# Patient Record
Sex: Female | Born: 1995 | Race: Black or African American | Hispanic: No | Marital: Single | Smoking: Never smoker
Health system: Southern US, Community
[De-identification: ages and names within clinical notes are randomized; demographics above are authoritative.]

## PROBLEM LIST (undated history)

## (undated) ENCOUNTER — Inpatient Hospital Stay (HOSPITAL_COMMUNITY): Payer: Self-pay

## (undated) DIAGNOSIS — R569 Unspecified convulsions: Secondary | ICD-10-CM

## (undated) HISTORY — PX: TOOTH EXTRACTION: SUR596

---
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
## 2016-03-11 DIAGNOSIS — R918 Other nonspecific abnormal finding of lung field: Secondary | ICD-10-CM | POA: Diagnosis not present

## 2016-04-04 DIAGNOSIS — O99011 Anemia complicating pregnancy, first trimester: Secondary | ICD-10-CM | POA: Diagnosis not present

## 2016-04-04 DIAGNOSIS — G40309 Generalized idiopathic epilepsy and epileptic syndromes, not intractable, without status epilepticus: Secondary | ICD-10-CM | POA: Diagnosis not present

## 2016-04-04 DIAGNOSIS — Z283 Underimmunization status: Secondary | ICD-10-CM | POA: Diagnosis not present

## 2016-04-04 DIAGNOSIS — O09899 Supervision of other high risk pregnancies, unspecified trimester: Secondary | ICD-10-CM | POA: Insufficient documentation

## 2017-10-17 DIAGNOSIS — N3000 Acute cystitis without hematuria: Secondary | ICD-10-CM | POA: Diagnosis not present

## 2017-10-17 DIAGNOSIS — D649 Anemia, unspecified: Secondary | ICD-10-CM | POA: Diagnosis not present

## 2017-10-17 DIAGNOSIS — X58XXXA Exposure to other specified factors, initial encounter: Secondary | ICD-10-CM | POA: Diagnosis not present

## 2017-10-17 DIAGNOSIS — Z9114 Patient's other noncompliance with medication regimen: Secondary | ICD-10-CM | POA: Diagnosis not present

## 2017-10-17 DIAGNOSIS — R569 Unspecified convulsions: Secondary | ICD-10-CM | POA: Diagnosis not present

## 2017-10-17 DIAGNOSIS — S0990XA Unspecified injury of head, initial encounter: Secondary | ICD-10-CM | POA: Diagnosis not present

## 2017-10-17 DIAGNOSIS — E669 Obesity, unspecified: Secondary | ICD-10-CM | POA: Diagnosis not present

## 2017-10-17 DIAGNOSIS — M542 Cervicalgia: Secondary | ICD-10-CM | POA: Diagnosis not present

## 2017-10-17 DIAGNOSIS — N3001 Acute cystitis with hematuria: Secondary | ICD-10-CM | POA: Diagnosis not present

## 2017-10-17 DIAGNOSIS — G40909 Epilepsy, unspecified, not intractable, without status epilepticus: Secondary | ICD-10-CM | POA: Diagnosis not present

## 2017-10-17 DIAGNOSIS — Z6841 Body Mass Index (BMI) 40.0 and over, adult: Secondary | ICD-10-CM | POA: Diagnosis not present

## 2018-02-17 ENCOUNTER — Other Ambulatory Visit: Payer: Self-pay

## 2018-02-17 ENCOUNTER — Encounter (HOSPITAL_COMMUNITY): Payer: Self-pay | Admitting: Emergency Medicine

## 2018-02-17 ENCOUNTER — Emergency Department (HOSPITAL_COMMUNITY)
Admission: EM | Admit: 2018-02-17 | Discharge: 2018-02-17 | Disposition: A | Discharge status: Home / Self Care | Payer: Medicaid Other | Attending: Emergency Medicine | Admitting: Emergency Medicine

## 2018-02-17 ENCOUNTER — Emergency Department (HOSPITAL_COMMUNITY): Payer: Medicaid Other

## 2018-02-17 DIAGNOSIS — R059 Cough, unspecified: Secondary | ICD-10-CM

## 2018-02-17 DIAGNOSIS — G40909 Epilepsy, unspecified, not intractable, without status epilepticus: Secondary | ICD-10-CM | POA: Diagnosis not present

## 2018-02-17 DIAGNOSIS — R05 Cough: Secondary | ICD-10-CM | POA: Insufficient documentation

## 2018-02-17 DIAGNOSIS — R404 Transient alteration of awareness: Secondary | ICD-10-CM | POA: Diagnosis not present

## 2018-02-17 DIAGNOSIS — R569 Unspecified convulsions: Secondary | ICD-10-CM | POA: Diagnosis not present

## 2018-02-17 DIAGNOSIS — G40309 Generalized idiopathic epilepsy and epileptic syndromes, not intractable, without status epilepticus: Secondary | ICD-10-CM | POA: Diagnosis not present

## 2018-02-17 DIAGNOSIS — R531 Weakness: Secondary | ICD-10-CM | POA: Diagnosis not present

## 2018-02-17 LAB — CBC WITH DIFFERENTIAL/PLATELET
ABS IMMATURE GRANULOCYTES: 0.06 10*3/uL (ref 0.00–0.07)
Basophils Absolute: 0 10*3/uL (ref 0.0–0.1)
Basophils Relative: 0 %
Eosinophils Absolute: 0.1 10*3/uL (ref 0.0–0.5)
Eosinophils Relative: 2 %
HCT: 35.4 % — ABNORMAL LOW (ref 36.0–46.0)
Hemoglobin: 10.4 g/dL — ABNORMAL LOW (ref 12.0–15.0)
Immature Granulocytes: 1 %
Lymphocytes Relative: 27 %
Lymphs Abs: 1.9 10*3/uL (ref 0.7–4.0)
MCH: 23.9 pg — ABNORMAL LOW (ref 26.0–34.0)
MCHC: 29.4 g/dL — ABNORMAL LOW (ref 30.0–36.0)
MCV: 81.4 fL (ref 80.0–100.0)
MONO ABS: 0.6 10*3/uL (ref 0.1–1.0)
Monocytes Relative: 8 %
Neutro Abs: 4.3 10*3/uL (ref 1.7–7.7)
Neutrophils Relative %: 62 %
Platelets: 378 10*3/uL (ref 150–400)
RBC: 4.35 MIL/uL (ref 3.87–5.11)
RDW: 15.2 % (ref 11.5–15.5)
WBC: 7 10*3/uL (ref 4.0–10.5)
nRBC: 0 % (ref 0.0–0.2)

## 2018-02-17 LAB — BASIC METABOLIC PANEL
ANION GAP: 5 (ref 5–15)
BUN: 7 mg/dL (ref 6–20)
CO2: 21 mmol/L — ABNORMAL LOW (ref 22–32)
Calcium: 8.3 mg/dL — ABNORMAL LOW (ref 8.9–10.3)
Chloride: 109 mmol/L (ref 98–111)
Creatinine, Ser: 0.6 mg/dL (ref 0.44–1.00)
GFR calc non Af Amer: >60 mL/min (ref >60)
Glucose, Bld: 136 mg/dL — ABNORMAL HIGH (ref 70–99)
Potassium: 3.9 mmol/L (ref 3.5–5.1)
Sodium: 135 mmol/L (ref 135–145)

## 2018-02-17 LAB — I-STAT BETA HCG BLOOD, ED (MC, WL, AP ONLY): I-stat hCG, quantitative: <5 m[IU]/mL (ref <5)

## 2018-02-17 LAB — CBG MONITORING, ED: Glucose-Capillary: 133 mg/dL — ABNORMAL HIGH (ref 70–99)

## 2018-02-17 MED ORDER — TOPIRAMATE 25 MG PO TABS: 25.0000 mg | ORAL_TABLET | Freq: Two times a day (BID) | ORAL | 1 refills | Status: DC

## 2018-02-17 MED ORDER — LEVETIRACETAM IN NACL 1000 MG/100ML IV SOLN
1000.0000 mg | Freq: Once | INTRAVENOUS | Status: AC
  Administered 2018-02-17: 1000 mg via INTRAVENOUS
  Filled 2018-02-17: qty 100

## 2018-02-17 NOTE — ED Notes (Signed)
Bed: WA23 Expected date:  Expected time:  Means of arrival:  Comments: EMS 

## 2018-02-17 NOTE — Discharge Instructions (Addendum)
Call one of the neurology clinics listed to schedule a follow up appointment as soon as possible.   Take your medication as directed. Do not miss any doses.   If you have another seizure, please return to ER. You should also return to ER for new or worsening symptoms, any additional concerns.

## 2018-02-17 NOTE — ED Notes (Signed)
Ambulate pt attempted, pt did not make it out of room. After standing from bed, pt attempted to walk but began to slump over was caught NT. Pt stated wanting to try again, pt made it to door and began to stumble then once again begin knees began to give out.  Pt stated she was too weak to try anymore. Vitals at 1400 were taken immediately afterwards.

## 2018-02-17 NOTE — ED Notes (Signed)
Pt urine sample in room 

## 2018-02-17 NOTE — ED Notes (Signed)
Pt states that she still feels too nauseated to drink much of anything.
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
## 2018-02-17 NOTE — ED Provider Notes (Signed)
[facility]-EMERGENCY DEPT Provider Note   CSN: [id number] Arrival date & time: 02/17/18  1126     History   Chief Complaint Chief Complaint  Patient presents with  . Seizures    HPI Deborah Singleton is a 23 y.o. female.  The history is provided by the patient and medical records. No language interpreter was used.  Seizures   Deborah Singleton is a 23 y.o. female  with a PMH of seizures who presents to the Emergency Department for evaluation after likely seizure activity this morning.  Aunt at bedside witnessed seizure.  She states that she was sitting on the bed when she suddenly began clenching her arms and all 4 extremities were shaking.  She bit her tongue.  Her aunt and laid her on her side, then called EMS.  Seizure activity lasted maybe a minute, no more than 3 to 4 minutes.  Self resolved without intervention.  No further seizure activity.  Patient states that she should be on Keppra, but lost her Medicaid and has not gotten new prescriptions due to financial concerns.  She has had a cough for the last week or so, but no fever or shortness of breath.  No wheezing or chest pain.  No other recent illness.  She did stay up all night long last night celebrating her aunt's birthday.  Patient feels back to baseline and aunt also believes that she is back to her usual self as well.  Per chart review, it appears Topamax is the most recent medication that she has been on for her seizure disorder.  Per her neurology notes, this was tolerated much better by her.  Once I brought this up to her, patient stated that this was indeed correct and Topamax was indeed the most recent medication and what she should be on.   Past Medical History:  Diagnosis Date  . Seizures [facility](HCC)     Patient Active Problem List   Diagnosis Date Noted  . Supervision of normal first teen pregnancy in second trimester 01/20/2013  . Insufficient prenatal care in second trimester 01/20/2013    Past  Surgical History:  Procedure Laterality Date  . TOOTH EXTRACTION       OB History    Gravida  1   Para      Term      Preterm      AB      Living  0     SAB      TAB      Ectopic      Multiple      Live Births               Home Medications    Prior to Admission medications   Medication Sig Start Date End Date Taking? Authorizing Provider  ondansetron (ZOFRAN ODT) 4 MG disintegrating tablet 4mg  ODT q4 hours prn nausea/vomit Patient not taking: Reported on 02/17/2018 12/04/14   Pricilla LovelessGoldston, Scott, MD  topiramate (TOPAMAX) 25 MG tablet Take 1 tablet (25 mg total) by mouth 2 (two) times daily. Take 1 pill (25 mg) PO BID x 1 week, then 2 pill BID 02/17/18   Nalee Lightle, Chase PicketJaime Pilcher, PA-C    Family History Family History  Problem Relation Age of Onset  . Asthma Mother   . Depression Sister     Social History Social History   Tobacco Use  . Smoking status: Never Smoker  . Smokeless tobacco: Never Used  Substance Use Topics  . Alcohol use:  Yes    Comment: Occasionally   . Drug use: No     Allergies   Acetaminophen   Review of Systems Review of Systems  Skin: Positive for wound (Bit tongue.).  Neurological: Positive for seizures.  All other systems reviewed and are negative.    Physical Exam Updated Vital Signs BP 113/90   Pulse 88   Temp 98.6 F (37 C) (Oral)   Resp 17   Ht 5\' 1"  (1.549 m)   Wt 94.3 kg   LMP  (LMP Unknown)   SpO2 100%   BMI 39.30 kg/m   Physical Exam Vitals signs and nursing note reviewed.  Constitutional:      General: She is not in acute distress.    Appearance: She is well-developed.  HENT:     Head: Normocephalic and atraumatic.     Mouth/Throat:     Comments: Left side of the tongue with small laceration consistent with tongue biting. Neck:     Musculoskeletal: Neck supple.  Cardiovascular:     Rate and Rhythm: Normal rate and regular rhythm.     Heart sounds: Normal heart sounds. No murmur.  Pulmonary:      Effort: Pulmonary effort is normal. No respiratory distress.     Breath sounds: Normal breath sounds.  Abdominal:     General: There is no distension.     Palpations: Abdomen is soft.     Tenderness: There is no abdominal tenderness.  Skin:    General: Skin is warm and dry.  Neurological:     Mental Status: She is alert and oriented to person, place, and time.     Comments: Alert, oriented, thought content appropriate. Speech is clear and goal oriented, able to follow commands.  Cranial Nerves:  II:  Peripheral visual fields grossly normal, pupils equal, round, reactive to light III, IV, VI: EOM intact bilaterally, ptosis not present V,VII: smile symmetric, eyes kept closed tightly against resistance, facial light touch sensation equal VIII: hearing grossly normal IX, X: symmetric soft palate movement, uvula elevates symmetrically  XI: bilateral shoulder shrug symmetric and strong XII: midline tongue extension 5/5 muscle strength in upper and lower extremities bilaterally including strong and equal grip strength and dorsiflexion/plantar flexion Sensory to light touch normal in all four extremities.      ED Treatments / Results  Labs (all labs ordered are listed, but only abnormal results are displayed) Labs Reviewed  CBC WITH DIFFERENTIAL/PLATELET - Abnormal; Notable for the following components:      Result Value   Hemoglobin 10.4 (*)    HCT 35.4 (*)    MCH 23.9 (*)    MCHC 29.4 (*)    All other components within normal limits  BASIC METABOLIC PANEL - Abnormal; Notable for the following components:   CO2 21 (*)    Glucose, Bld 136 (*)    Calcium 8.3 (*)    All other components within normal limits  CBG MONITORING, ED - Abnormal; Notable for the following components:   Glucose-Capillary 133 (*)    All other components within normal limits  I-STAT BETA HCG BLOOD, ED ([facility], [facility], [facility] ONLY)    EKG None  Radiology Dg Chest 2 View  Result Date: 02/17/2018 CLINICAL DATA:   Grand mal seizure EXAM: CHEST - 2 VIEW COMPARISON:  None. FINDINGS: Lungs are clear. Heart size and pulmonary vascularity are normal. No adenopathy. No bone lesions. IMPRESSION: No edema or consolidation. Electronically Signed   By: Bretta Bang III M.D.  On: 02/17/2018 13:21    Procedures Procedures (including critical care time)  Medications Ordered in ED Medications  levETIRAcetam (KEPPRA) IVPB 1000 mg/100 mL premix (0 mg Intravenous Stopped 02/17/18 1249)     Initial Impression / Assessment and Plan / ED Course  I have reviewed the triage vital signs and the nursing notes.  Pertinent labs & imaging results that were available during my care of the patient were reviewed by me and considered in my medical decision making (see chart for details).    Pegeen Awwad is a 23 y.o. female who presents to ED for likely seizure activity earlier today.  Patient does have history of seizure disorder and has been noncompliant with her medication.  On exam, she is tired, but otherwise normal neurologic exam.  He did not hit her head or fall.  Labs and imaging reviewed and reassuring.  Loaded with Keppra upon arrival to the ER.  Observed for 3+ hours with no further seizure-like activity.  Upon discharge, patient was ambulatory, tolerating p.o. back to her baseline mental status.  Her last antiepileptic medication was Topamax and she tolerated this much better than Keppra.  We will restart her on Topamax.  She was recently moved from the [facility] area to [place], therefore does not have a neurologist in the area.  Referred to local neurology clinic.  Spoke with patient and family at bedside about reasons to return to the emergency department including another seizure.  All questions were answered.  Final Clinical Impressions(s) / ED Diagnoses   Final diagnoses:  Cough  Seizure [facility])    ED Discharge Orders         Ordered    topiramate (TOPAMAX) 25 MG tablet  2 times daily      02/17/18 1457           Jacai Kipp, Chase Picket, PA-C 02/17/18 1521    Mancel Bale, MD 02/19/18 1335

## 2018-02-17 NOTE — ED Notes (Signed)
Patient was given discharge teaching and verbalized understanding. Patient was taken out of ED with a wheelchair.

## 2018-02-17 NOTE — ED Notes (Signed)
ED EKG already taken but not yet printed

## 2018-02-17 NOTE — ED Notes (Signed)
Patient ambulated to the restroom and back to her room. Patient stated she felt weak but maintained appropriate balance while walking. Patient was given [place] sandwich upon request. Patient is eating and drinking with no difficulty.

## 2018-02-18 ENCOUNTER — Encounter: Payer: Self-pay | Admitting: Neurology

## 2018-04-23 ENCOUNTER — Telehealth: Payer: Self-pay

## 2018-04-23 NOTE — Telephone Encounter (Signed)
Unfortunately I cannot call in Rx without a visit first. Can she be put in for 9:30am on 3/25 Wed (she is currently scheduled for 3/30). Thanks

## 2018-04-23 NOTE — Telephone Encounter (Signed)
Called pt to advise that we are not seeing pt's in the office during this time due to COVID19 pandemic.  Gathered information to set up Virtual Visit.   Pt states that she is out of Topiramate and would like to know if Dr. Karel Jarvis OK with prescribing.  Pt states she now uses [facility] on [address]. Advised that I would send message and return call with Dr. Rosalyn Gess response.

## 2018-04-24 ENCOUNTER — Telehealth (INDEPENDENT_AMBULATORY_CARE_PROVIDER_SITE_OTHER): Payer: Medicaid Other | Admitting: Neurology

## 2018-04-24 ENCOUNTER — Other Ambulatory Visit: Payer: Self-pay

## 2018-04-24 DIAGNOSIS — G40309 Generalized idiopathic epilepsy and epileptic syndromes, not intractable, without status epilepticus: Secondary | ICD-10-CM

## 2018-04-24 MED ORDER — TOPIRAMATE 50 MG PO TABS: ORAL_TABLET | ORAL | 11 refills | Status: DC

## 2018-04-24 MED ORDER — FOLIC ACID 5 MG PO CAPS: ORAL_CAPSULE | ORAL | 11 refills | Status: DC

## 2018-04-24 NOTE — Progress Notes (Signed)
Virtual Visit via Video Note The purpose of this virtual visit is to provide medical care while limiting exposure to the novel coronavirus.    Consent was obtained for video visit:  Yes.   Answered questions that patient had about telehealth interaction:  Yes.   I discussed the limitations, risks, security and privacy concerns of performing an evaluation and management service by telemedicine. I also discussed with the patient that there may be a patient responsible charge related to this service. The patient expressed understanding and agreed to proceed.  Pt location: Home Physician Location: office Name of referring provider:  Mancel Bale, MD I connected with Deborah Singleton at patients initiation/request on 04/24/2018 at  9:30 AM EDT by video enabled telemedicine application and verified that I am speaking with the correct person using two identifiers. Pt MRN:  [id number] Pt DOB:  [birth year]  History of Present Illness:  This is a 23 year old right-handed woman with a history of seizures since age 23 presenting to establish care. She was previously seeing epileptologist Dr. Sherlean Foot at [facility], records were reviewed and will be summarized below. Seizures started at age 23 soon after her son was born. She recalls feeling lightheaded and "going in and out" then had a seizure in the car. She reports body jerks where she would drop things. She had an EEG at [facility] in 08/2014 with interictal epileptiform discharges consisting of polyspikes which were not clearly lateralizing, they are frequently seen on the left side but also occasionally on the right hemisphere. There were also polyspikes that were generalized. MRI brain with and without contrast in 01/2014 reported a single T2 hyperintensity in the left frontal region otherwise normal, MRA normal. She was previously on Levetiracetam and Lamotrigine, currently on Topiramate 50mg  BID. Her last visit at [facility] was in 2018, at that time she was started  on Levetiracetam. She was in the ER in 09/2017 where she was prescribed the Topiramate, then lost her insurance and was back in the ER for a seizure on 02/17/2018 and Topiramate was restarted. She reports seizures every 1-2 months. She has bitten her tongue a few times, no post-ictal focal weakness. She would lose her memory after seizures. She lives with her children's father who has mentioned staring spells, but not that often. Seizure triggers are alcohol and "being around people who smoke and drink." She has headaches if she does not get a lot of sleep or moves around a lot. No olfactory/gustatory hallucinations, dizziness, diplopia, dysarthria/dysphagia, neck/back pain, focal numbness/tingling/weakness, bowel/bladder dysfunction. Sleep is good, sometimes she tosses and turns. She is not driving. She is currently unemployed. No pregnancy plans, she is sexually active and currently not on contraception. She felt the implant and Depo shots were causing weight gain and more seizures.   Observations/Objective:  Patient is awake, alert, oriented x 3. No aphasia or dysarthria. Able to follow commands. Remote and recent memory intact. Able to name and repeat. Cranial nerves: Pupils equal, round. Extraocular movements intact with no nystagmus. Visual fields full to gross confrontation. No facial asymmetry. Motor: moves all extremities symmetrically, no pronator drift. No incoordination on finger to screen testing. Gait narrow-based and steady, no ataxia, able to tandem walk adequately. Romberg negative.  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 She is on a low dose of Topiramate 50mg  BID with continued seizures, last seizure was in  January 2020, increase Topiramate to 75mg  BID. We also discussed issues in women with epilepsy, she has no pregnancy plans but not on contraception and sexually active, discussed talking to her gynecologist about the Mirena IUD. We discussed effects of Topiramate on fetus in the event she becomes pregnant, start daily folic acid. She does not drive. Follow-up in 3-4 months, call for any changes.   Follow Up Instructions:   -I discussed the assessment and treatment plan with the patient. The patient was provided an opportunity to ask questions and all were answered. The patient agreed with the plan and demonstrated an understanding of the instructions.   The patient was advised to call back or seek an in-person evaluation if the symptoms worsen or if the condition fails to improve as anticipated.   Total Time spent in visit with the patient was 45 minutes, of which more than 50% of the time was spent in counseling and/or coordinating care on the above.   Pt understands and agrees with the plan of care outlined.     Van Clines, MD

## 2018-04-29 ENCOUNTER — Ambulatory Visit: Payer: Medicaid Other | Admitting: Neurology

## 2018-06-26 DIAGNOSIS — L0231 Cutaneous abscess of buttock: Secondary | ICD-10-CM | POA: Diagnosis not present

## 2018-10-01 DIAGNOSIS — Z862 Personal history of diseases of the blood and blood-forming organs and certain disorders involving the immune mechanism: Secondary | ICD-10-CM | POA: Diagnosis not present

## 2018-10-01 DIAGNOSIS — Z658 Other specified problems related to psychosocial circumstances: Secondary | ICD-10-CM | POA: Diagnosis not present

## 2018-10-01 DIAGNOSIS — G40309 Generalized idiopathic epilepsy and epileptic syndromes, not intractable, without status epilepticus: Secondary | ICD-10-CM | POA: Diagnosis not present

## 2018-10-01 DIAGNOSIS — Z23 Encounter for immunization: Secondary | ICD-10-CM | POA: Diagnosis not present

## 2018-10-09 ENCOUNTER — Other Ambulatory Visit: Payer: Self-pay

## 2018-10-09 ENCOUNTER — Encounter (HOSPITAL_COMMUNITY): Payer: Self-pay | Admitting: Emergency Medicine

## 2018-10-09 ENCOUNTER — Emergency Department (HOSPITAL_COMMUNITY)
Admission: EM | Admit: 2018-10-09 | Discharge: 2018-10-10 | Discharge status: Against Medical Advice | Payer: Medicaid Other | Attending: Emergency Medicine | Admitting: Emergency Medicine

## 2018-10-09 DIAGNOSIS — Z5321 Procedure and treatment not carried out due to patient leaving prior to being seen by health care provider: Secondary | ICD-10-CM | POA: Insufficient documentation

## 2018-10-09 DIAGNOSIS — L02413 Cutaneous abscess of right upper limb: Secondary | ICD-10-CM | POA: Diagnosis present

## 2018-10-09 NOTE — ED Triage Notes (Signed)
C/o abscess to R forearm and R buttocks since Sunday.

## 2018-10-09 NOTE — ED Notes (Signed)
Called for pt x3. No answer.  

## 2018-10-15 DIAGNOSIS — Z3042 Encounter for surveillance of injectable contraceptive: Secondary | ICD-10-CM | POA: Diagnosis not present

## 2018-10-15 DIAGNOSIS — Z113 Encounter for screening for infections with a predominantly sexual mode of transmission: Secondary | ICD-10-CM | POA: Diagnosis not present

## 2018-10-15 DIAGNOSIS — L02413 Cutaneous abscess of right upper limb: Secondary | ICD-10-CM | POA: Diagnosis not present

## 2018-10-15 DIAGNOSIS — R4589 Other symptoms and signs involving emotional state: Secondary | ICD-10-CM | POA: Diagnosis not present

## 2018-10-15 DIAGNOSIS — Z3009 Encounter for other general counseling and advice on contraception: Secondary | ICD-10-CM | POA: Diagnosis not present

## 2018-10-15 DIAGNOSIS — Z124 Encounter for screening for malignant neoplasm of cervix: Secondary | ICD-10-CM | POA: Diagnosis not present

## 2018-12-23 ENCOUNTER — Other Ambulatory Visit: Payer: Self-pay

## 2018-12-23 DIAGNOSIS — Z20822 Contact with and (suspected) exposure to covid-19: Secondary | ICD-10-CM

## 2018-12-23 DIAGNOSIS — Z20828 Contact with and (suspected) exposure to other viral communicable diseases: Secondary | ICD-10-CM | POA: Diagnosis not present

## 2018-12-24 ENCOUNTER — Encounter: Payer: Self-pay | Admitting: Neurology

## 2018-12-25 LAB — NOVEL CORONAVIRUS, NAA: SARS-CoV-2, NAA: NOT DETECTED

## 2018-12-26 ENCOUNTER — Encounter (HOSPITAL_COMMUNITY): Payer: Self-pay | Admitting: Obstetrics and Gynecology

## 2018-12-26 ENCOUNTER — Other Ambulatory Visit: Payer: Self-pay

## 2018-12-26 ENCOUNTER — Emergency Department (HOSPITAL_COMMUNITY): Payer: Medicaid Other

## 2018-12-26 ENCOUNTER — Emergency Department (HOSPITAL_COMMUNITY)
Admission: EM | Admit: 2018-12-26 | Discharge: 2018-12-26 | Disposition: A | Discharge status: Home / Self Care | Payer: Medicaid Other | Attending: Emergency Medicine | Admitting: Emergency Medicine

## 2018-12-26 DIAGNOSIS — R059 Cough, unspecified: Secondary | ICD-10-CM

## 2018-12-26 DIAGNOSIS — R05 Cough: Secondary | ICD-10-CM | POA: Insufficient documentation

## 2018-12-26 DIAGNOSIS — Z79899 Other long term (current) drug therapy: Secondary | ICD-10-CM | POA: Diagnosis not present

## 2018-12-26 DIAGNOSIS — R0602 Shortness of breath: Secondary | ICD-10-CM | POA: Insufficient documentation

## 2018-12-26 DIAGNOSIS — R000 Tachycardia, unspecified: Secondary | ICD-10-CM | POA: Diagnosis not present

## 2018-12-26 DIAGNOSIS — Z20828 Contact with and (suspected) exposure to other viral communicable diseases: Secondary | ICD-10-CM | POA: Insufficient documentation

## 2018-12-26 DIAGNOSIS — Z20822 Contact with and (suspected) exposure to covid-19: Secondary | ICD-10-CM

## 2018-12-26 LAB — CBC WITH DIFFERENTIAL/PLATELET
Abs Immature Granulocytes: 0 10*3/uL (ref 0.00–0.07)
Basophils Absolute: 0 10*3/uL (ref 0.0–0.1)
Basophils Relative: 1 %
Eosinophils Absolute: 0 10*3/uL (ref 0.0–0.5)
Eosinophils Relative: 1 %
HCT: 41.5 % (ref 36.0–46.0)
Hemoglobin: 12.8 g/dL (ref 12.0–15.0)
Immature Granulocytes: 0 %
Lymphocytes Relative: 65 %
Lymphs Abs: 1.9 10*3/uL (ref 0.7–4.0)
MCH: 26.4 pg (ref 26.0–34.0)
MCHC: 30.8 g/dL (ref 30.0–36.0)
MCV: 85.6 fL (ref 80.0–100.0)
Monocytes Absolute: 0.4 10*3/uL (ref 0.1–1.0)
Monocytes Relative: 13 %
Neutro Abs: 0.6 10*3/uL — ABNORMAL LOW (ref 1.7–7.7)
Neutrophils Relative %: 20 %
Platelets: 217 10*3/uL (ref 150–400)
RBC: 4.85 MIL/uL (ref 3.87–5.11)
RDW: 14.5 % (ref 11.5–15.5)
WBC: 2.9 10*3/uL — ABNORMAL LOW (ref 4.0–10.5)
nRBC: 0 % (ref 0.0–0.2)

## 2018-12-26 LAB — COMPREHENSIVE METABOLIC PANEL
ALT: 42 U/L (ref 0–44)
AST: 57 U/L — ABNORMAL HIGH (ref 15–41)
Albumin: 4 g/dL (ref 3.5–5.0)
Alkaline Phosphatase: 77 U/L (ref 38–126)
Anion gap: 7 (ref 5–15)
BUN: 6 mg/dL (ref 6–20)
CO2: 20 mmol/L — ABNORMAL LOW (ref 22–32)
Calcium: 8.7 mg/dL — ABNORMAL LOW (ref 8.9–10.3)
Chloride: 113 mmol/L — ABNORMAL HIGH (ref 98–111)
Creatinine, Ser: 1.01 mg/dL — ABNORMAL HIGH (ref 0.44–1.00)
GFR calc Af Amer: >60 mL/min (ref >60)
GFR calc non Af Amer: >60 mL/min (ref >60)
Glucose, Bld: 127 mg/dL — ABNORMAL HIGH (ref 70–99)
Potassium: 3.1 mmol/L — ABNORMAL LOW (ref 3.5–5.1)
Sodium: 140 mmol/L (ref 135–145)
Total Bilirubin: 0.4 mg/dL (ref 0.3–1.2)
Total Protein: 7.3 g/dL (ref 6.5–8.1)

## 2018-12-26 LAB — D-DIMER, QUANTITATIVE: D-Dimer, Quant: 1.01 ug/mL-FEU — ABNORMAL HIGH (ref 0.00–0.50)

## 2018-12-26 LAB — TROPONIN I (HIGH SENSITIVITY)
Troponin I (High Sensitivity): <2 ng/L (ref <18)
Troponin I (High Sensitivity): <2 ng/L (ref <18)

## 2018-12-26 LAB — I-STAT BETA HCG BLOOD, ED (MC, WL, AP ONLY): I-stat hCG, quantitative: <5 m[IU]/mL (ref <5)

## 2018-12-26 MED ORDER — ALBUTEROL SULFATE HFA 108 (90 BASE) MCG/ACT IN AERS
6.0000 | INHALATION_SPRAY | Freq: Once | RESPIRATORY_TRACT | Status: AC
  Administered 2018-12-26: 17:00:00 6 via RESPIRATORY_TRACT
  Filled 2018-12-26: qty 6.7

## 2018-12-26 MED ORDER — ALBUTEROL SULFATE HFA 108 (90 BASE) MCG/ACT IN AERS
2.0000 | INHALATION_SPRAY | Freq: Once | RESPIRATORY_TRACT | Status: AC
  Administered 2018-12-26: 2 via RESPIRATORY_TRACT

## 2018-12-26 MED ORDER — SODIUM CHLORIDE (PF) 0.9 % IJ SOLN
INTRAMUSCULAR | Status: AC
  Filled 2018-12-26: qty 50

## 2018-12-26 MED ORDER — HYDROXYZINE HCL 25 MG PO TABS
25.0000 mg | ORAL_TABLET | Freq: Once | ORAL | Status: AC
  Administered 2018-12-26: 25 mg via ORAL
  Filled 2018-12-26: qty 1

## 2018-12-26 MED ORDER — BENZONATATE 100 MG PO CAPS: 100.0000 mg | ORAL_CAPSULE | Freq: Three times a day (TID) | ORAL | 0 refills | Status: DC

## 2018-12-26 MED ORDER — IOHEXOL 300 MG/ML  SOLN
100.0000 mL | Freq: Once | INTRAMUSCULAR | Status: AC | PRN
  Administered 2018-12-26: 100 mL via INTRAVENOUS

## 2018-12-26 NOTE — ED Notes (Signed)
Pt said she feels scared bc she hasn't had covid before now. I tried to ease her concerns and answer her questions.

## 2018-12-26 NOTE — Discharge Instructions (Signed)
Albuterol inhaler as needed.  Take Tessalon Perles as needed for cough.  You need to stay until your Covid test has resulted.

## 2018-12-26 NOTE — ED Provider Notes (Signed)
[facility]-EMERGENCY DEPT Provider Note   CSN: [id number] Arrival date & time: 12/26/18  1451    History   Chief Complaint Chief Complaint  Patient presents with   Shortness of Breath   HPI Deborah Singleton is a 23 y.o. female with past medical history significant for seizures who presents for evaluation of SOB. Patient states she has had shortness of breath and chest pain over the last 3 days.  Shortness of breath with exertion.  Chest pain is pleuritic in nature.  No exertional chest pain.  She denies prior history of PE or DVT.  She did have a Covid test on Sunday which was negative per patient.  She denies any fever, chills, nausea, vomiting, abdominal pain, diarrhea, dysuria, lateral redness, swelling, warmth to extremities.  She denies associated diaphoresis, lightheadedness, dizziness.  No prior heart or lung issues.  She did try her mother's albuterol inhaler which did not help her symptoms.  Denies additional aggravating or alleviating factors.  No recent surgery, immobilization, OCP, Heavy vaginal bleeding. No hx HTN, Hyperlipidemia, hx of MI. Possible family hx of MI at early age.  History obtained from patient and past medical records.  No interpreter is used.     HPI  Past Medical History:  Diagnosis Date   Seizures [facility](HCC)     Patient Active Problem List   Diagnosis Date Noted   Generalized idiopathic epilepsy and epileptic syndromes, not intractable, without status epilepticus (HCC) 04/24/2018   Supervision of normal first teen pregnancy in second trimester 01/20/2013   Insufficient prenatal care in second trimester 01/20/2013    Past Surgical History:  Procedure Laterality Date   TOOTH EXTRACTION       OB History    Gravida  1   Para      Term      Preterm      AB      Living  0     SAB      TAB      Ectopic      Multiple      Live Births               Home Medications    Prior to Admission medications     Medication Sig Start Date End Date Taking? Authorizing Provider  albuterol (ACCUNEB) 1.25 MG/3ML nebulizer solution Take 1 ampule by nebulization every 6 (six) hours as needed for wheezing or shortness of breath.   Yes [provider]  topiramate (TOPAMAX) 50 MG tablet Take 1.5 tablets twice a day Patient taking differently: Take 75 mg by mouth 2 (two) times daily.  04/24/18  Yes Van ClinesAquino, Karen M, MD  benzonatate (TESSALON) 100 MG capsule Take 1 capsule (100 mg total) by mouth every 8 (eight) hours. 12/26/18   Cherylanne Ardelean A, PA-C    Family History Family History  Problem Relation Age of Onset   Asthma Mother    Depression Sister     Social History Social History   Tobacco Use   Smoking status: Never Smoker   Smokeless tobacco: Never Used  Substance Use Topics   Alcohol use: Yes    Comment: Occasionally    Drug use: No     Allergies   Acetaminophen   Review of Systems Review of Systems  Constitutional: Negative.   HENT: Negative.   Respiratory: Positive for shortness of breath. Negative for apnea, choking, chest tightness, wheezing and stridor. Cough: "Mild cough."   Cardiovascular: Positive for chest pain. Negative  for palpitations and leg swelling.  Gastrointestinal: Negative.   Genitourinary: Negative.   Musculoskeletal: Negative.   Skin: Negative.   Neurological: Negative.   All other systems reviewed and are negative.    Physical Exam Updated Vital Signs BP 120/71    Pulse 95    Temp 98.7 F (37.1 C) (Oral)    Resp (!) 28    SpO2 98%   Physical Exam Vitals signs and nursing note reviewed.  Constitutional:      General: She is not in acute distress.    Appearance: She is well-developed. She is not ill-appearing, toxic-appearing or diaphoretic.  HENT:     Head: Normocephalic and atraumatic.     Jaw: There is normal jaw occlusion.     Nose:     Comments:  No sinus tenderness.    Mouth/Throat:     Mouth: Mucous membranes are moist.      Pharynx: Oropharynx is clear.     Comments: Posterior oropharynx clear.  Mucous membranes moist.  Tonsils without erythema or exudate.  Uvula midline without deviation.  No evidence of PTA or RPA.  No drooling, dysphasia or trismus.  Phonation normal. Eyes:     Pupils: Pupils are equal, round, and reactive to light.  Neck:     Musculoskeletal: Normal range of motion.     Trachea: Trachea and phonation normal.     Meningeal: Brudzinski's sign and Kernig's sign absent.     Comments: No Neck stiffness or neck rigidity.  No meningismus.  No cervical lymphadenopathy. Cardiovascular:     Rate and Rhythm: Normal rate.     Comments: No murmurs rubs or gallops. Pulmonary:     Effort: Pulmonary effort is normal. No respiratory distress.     Breath sounds: Normal breath sounds.     Comments: Clear to auscultation bilaterally without wheeze, rhonchi or rales.  No accessory muscle usage.  Able speak in full sentences. Abdominal:     General: There is no distension.     Comments: Soft, nontender without rebound or guarding.  No CVA tenderness.  Musculoskeletal: Normal range of motion.     Comments: Moves all 4 extremities without difficulty.  Lower extremities without edema, erythema or warmth.  Skin:    General: Skin is warm and dry.     Comments: Brisk capillary refill.  No rashes or lesions.  Neurological:     Mental Status: She is alert.     Comments: Ambulatory in department without difficulty.  Cranial nerves II through XII grossly intact.  No facial droop.  No aphasia.    ED Treatments / Results  Labs (all labs ordered are listed, but only abnormal results are displayed) Labs Reviewed  CBC WITH DIFFERENTIAL/PLATELET - Abnormal; Notable for the following components:      Result Value   WBC 2.9 (*)    Neutro Abs 0.6 (*)    All other components within normal limits  COMPREHENSIVE METABOLIC PANEL - Abnormal; Notable for the following components:   Potassium 3.1 (*)    Chloride 113 (*)     CO2 20 (*)    Glucose, Bld 127 (*)    Creatinine, Ser 1.01 (*)    Calcium 8.7 (*)    AST 57 (*)    All other components within normal limits  D-DIMER, QUANTITATIVE (NOT AT [facility]) - Abnormal; Notable for the following components:   D-Dimer, Quant 1.01 (*)    All other components within normal limits  SARS CORONAVIRUS 2 (TAT 6-24  HRS)  PATHOLOGIST SMEAR REVIEW  I-STAT BETA HCG BLOOD, ED ([facility], [facility], [facility] ONLY)  TROPONIN I (HIGH SENSITIVITY)  TROPONIN I (HIGH SENSITIVITY)    EKG EKG Interpretation  Date/Time:  Thursday December 26 2018 15:05:17 EST Ventricular Rate:  101 PR Interval:    QRS Duration: 76 QT Interval:  333 QTC Calculation: 432 R Axis:   31 Text Interpretation: Sinus tachycardia Borderline T wave abnormalities No previous tracing Confirmed by Gwyneth Sprout ([id number]) on 12/26/2018 3:14:02 PM  Radiology Ct Angio Chest Pe W And/or Wo Contrast  Result Date: 12/26/2018 CLINICAL DATA:  Shortness of breath, concern for PE EXAM: CT ANGIOGRAPHY CHEST WITH CONTRAST TECHNIQUE: Multidetector CT imaging of the chest was performed using the standard protocol during bolus administration of intravenous contrast. Multiplanar CT image reconstructions and MIPs were obtained to evaluate the vascular anatomy. CONTRAST:  OMNIPAQUE IOHEXOL 300 MG/ML  SOLN COMPARISON:  Chest radiograph from the same day. FINDINGS: Cardiovascular: Satisfactory opacification of the pulmonary arteries to the segmental level. No evidence of pulmonary embolism. Normal heart size. No pericardial effusion. Mediastinum/Nodes: No enlarged mediastinal, hilar, or axillary lymph nodes. Thyroid gland, trachea, and esophagus demonstrate no significant findings. Lungs/Pleura: Bilateral upper lobe predominant ground-glass opacities with areas of consolidation are seen. There is no pleural effusion or pneumothorax. Upper Abdomen: No acute abnormality. Musculoskeletal: No chest wall abnormality. No acute or significant osseous  findings. Review of the MIP images confirms the above findings. IMPRESSION: 1. No evidence for pulmonary embolus. 2. Bilateral upper lobe predominant ground-glass opacities with areas of consolidation, likely represents pneumonia. COVID-19 pneumonia can have a similar appearance. Electronically Signed   By: Romona Curls M.D.   On: 12/26/2018 18:48   Dg Chest Portable 1 View  Result Date: 12/26/2018 CLINICAL DATA:  Shortness of breath. Asthma. EXAM: PORTABLE CHEST 1 VIEW COMPARISON:  02/17/2018 FINDINGS: Heart size and pulmonary vascularity are normal. Lungs are clear. Extremely shallow inspiration. No bone abnormality. IMPRESSION: No acute abnormalities. Extremely shallow inspiration. Electronically Signed   By: Francene Boyers M.D.   On: 12/26/2018 15:28    Procedures Procedures (including critical care time)  Medications Ordered in ED Medications  sodium chloride (PF) 0.9 % injection (has no administration in time range)  hydrOXYzine (ATARAX/VISTARIL) tablet 25 mg (25 mg Oral Given 12/26/18 1721)  albuterol (VENTOLIN HFA) 108 (90 Base) MCG/ACT inhaler 6 puff (6 puffs Inhalation Given 12/26/18 1721)  iohexol (OMNIPAQUE) 300 MG/ML solution 100 mL (100 mLs Intravenous Contrast Given 12/26/18 1816)    Initial Impression / Assessment and Plan / ED Course  I have reviewed the triage vital signs and the nursing notes.  Pertinent labs & imaging results that were available during my care of the patient were reviewed by me and considered in my medical decision making (see chart for details).  23 year old presents for evaluation of CP and SOB x 3 days. Afebrile, non septic appears. Negative COVID on Sunday however not not having symptoms then. CP pleuritic in nature. No exertional or radiation of pain. Possible MI at early age in family? Patient unsure. Heart and lungs clear on exam. No evidence of DVT on exam however she is tachycardic and cannot PERC. Possible tachycardia due to albuterol at home  form her Mother.  Labs and imaging personally reviewed: CBC with leukopenia at 2.9, Hgb 12.8 Troponin negative D-dimer elevated at 1.01, Will obtain CTA chest to r/o PE CMP with hypokalemia at 3.1, mildly elevated glucose at 127, creatinine at 1.01 EKG with sinus  tachycardia DG chest without acute infiltrates, pulmonary edema, pneumothorax, cardiomegaly CTA chest with bilateral pneumonia possibly COVID pneumonia  Patient reevaluated Sx improved. Patients VS without tachypnea or hypoxia. Ambulatory in ED without hypoxia. Question COVID as cause of SOB and mild cough give CT scan. Will retest outpatient COVID test and patient was likely too early in her disease process to test positive flexor test from 4 days ago.  Low suspicion for ACS, PE, dissection, myocarditis, pericarditis.  Discussed symptomatic management.  Patient to return for any worsening symptoms.  She has no evidence of sepsis or sirs.  Do not feel that patient needs antibiotics for bacterial pneumonia at this time given likely Covid source.  The patient has been appropriately medically screened and/or stabilized in the ED. I have low suspicion for any other emergent medical condition which would require further screening, evaluation or treatment in the ED or require inpatient management.  Patient is hemodynamically stable and in no acute distress.  Patient able to ambulate in department prior to ED.  Evaluation does not show acute pathology that would require ongoing or additional emergent interventions while in the emergency department or further inpatient treatment.  I have discussed the diagnosis with the patient and answered all questions.  Pain is been managed while in the emergency department and patient has no further complaints prior to discharge.  Patient is comfortable with plan discussed in room and is stable for discharge at this time.  I have discussed strict return precautions for returning to the emergency department.   Patient was encouraged to follow-up with PCP/specialist refer to at discharge.        Deborah Singleton was evaluated in Emergency Department on 12/26/2018 for the symptoms described in the history of present illness. She was evaluated in the context of the global COVID-19 pandemic, which necessitated consideration that the patient might be at risk for infection with the SARS-CoV-2 virus that causes COVID-19. Institutional protocols and algorithms that pertain to the evaluation of patients at risk for COVID-19 are in a state of rapid change based on information released by regulatory bodies including the CDC and federal and state organizations. These policies and algorithms were followed during the patient's care in the ED. Final Clinical Impressions(s) / ED Diagnoses   Final diagnoses:  Shortness of breath  COVID-19 virus test result unknown  Cough    ED Discharge Orders         Ordered    benzonatate (TESSALON) 100 MG capsule  Every 8 hours     12/26/18 1933           Alivia Cimino A, PA-C 12/26/18 1936    Gwyneth Sprout, MD 12/26/18 2146

## 2018-12-26 NOTE — ED Triage Notes (Signed)
Patient reports she Is having SOB and dyspnea with exertion. Patient reports her mom tried to give her a breathing treatment and it did not help.  Patient denies N/V/D.  Patient denies cough or fever.  Patient denies chance of pregnancy.

## 2018-12-26 NOTE — ED Notes (Addendum)
Trying to explain to pt that her CT scan looks as if she has COVID. Pt said she didn't hear anything the provider said because she was asleep. Pt said she is frustrated and she can't breathe. I explained that her O2 sat has been 99-100% since I have been in the room. And, I recommended she use the albuterol inhaler and fill the tessalon pearl prescription. These will help with her breathing. Explained the tessalon pearls are for cough and inhaler for difficulty breathing. I told her that she needs to quarantine for 10 days. Pt shook her head and didn't respond. I encouraged her to express her concerns, but she did not elaborate.

## 2018-12-26 NOTE — ED Notes (Signed)
Patient returned from radiology

## 2018-12-27 LAB — SARS CORONAVIRUS 2 (TAT 6-24 HRS): SARS Coronavirus 2: NEGATIVE

## 2018-12-27 LAB — PATHOLOGIST SMEAR REVIEW

## 2018-12-30 ENCOUNTER — Ambulatory Visit: Payer: Medicaid Other | Admitting: Neurology

## 2019-03-04 ENCOUNTER — Emergency Department (HOSPITAL_COMMUNITY)
Admission: EM | Admit: 2019-03-04 | Discharge: 2019-03-04 | Disposition: A | Discharge status: Home / Self Care | Payer: Medicaid Other | Attending: Emergency Medicine | Admitting: Emergency Medicine

## 2019-03-04 ENCOUNTER — Other Ambulatory Visit: Payer: Self-pay

## 2019-03-04 ENCOUNTER — Encounter (HOSPITAL_COMMUNITY): Payer: Self-pay | Admitting: Emergency Medicine

## 2019-03-04 DIAGNOSIS — B3731 Acute candidiasis of vulva and vagina: Secondary | ICD-10-CM

## 2019-03-04 DIAGNOSIS — B373 Candidiasis of vulva and vagina: Secondary | ICD-10-CM | POA: Insufficient documentation

## 2019-03-04 DIAGNOSIS — A5901 Trichomonal vulvovaginitis: Secondary | ICD-10-CM | POA: Diagnosis not present

## 2019-03-04 DIAGNOSIS — N899 Noninflammatory disorder of vagina, unspecified: Secondary | ICD-10-CM | POA: Diagnosis present

## 2019-03-04 DIAGNOSIS — N898 Other specified noninflammatory disorders of vagina: Secondary | ICD-10-CM | POA: Diagnosis not present

## 2019-03-04 LAB — URINALYSIS, ROUTINE W REFLEX MICROSCOPIC
Bilirubin Urine: NEGATIVE
Glucose, UA: NEGATIVE mg/dL
Hgb urine dipstick: NEGATIVE
Ketones, ur: NEGATIVE mg/dL
Nitrite: NEGATIVE
Protein, ur: 30 mg/dL — AB
Specific Gravity, Urine: 1.024 (ref 1.005–1.030)
pH: 7 (ref 5.0–8.0)

## 2019-03-04 LAB — WET PREP, GENITAL: Sperm: NONE SEEN

## 2019-03-04 LAB — PREGNANCY, URINE: Preg Test, Ur: NEGATIVE

## 2019-03-04 MED ORDER — AZITHROMYCIN 1 G PO PACK
1.0000 g | PACK | Freq: Once | ORAL | Status: AC
  Administered 2019-03-04: 13:00:00 1 g via ORAL
  Filled 2019-03-04: qty 1

## 2019-03-04 MED ORDER — METRONIDAZOLE 500 MG PO TABS
2000.0000 mg | ORAL_TABLET | Freq: Once | ORAL | Status: AC
  Administered 2019-03-04: 2000 mg via ORAL
  Filled 2019-03-04: qty 4

## 2019-03-04 MED ORDER — CEFTRIAXONE SODIUM 500 MG IJ SOLR
500.0000 mg | Freq: Once | INTRAMUSCULAR | Status: AC
  Administered 2019-03-04: 500 mg via INTRAMUSCULAR
  Filled 2019-03-04: qty 500

## 2019-03-04 MED ORDER — FLUCONAZOLE 150 MG PO TABS
150.0000 mg | ORAL_TABLET | Freq: Once | ORAL | Status: AC
  Administered 2019-03-04: 150 mg via ORAL
  Filled 2019-03-04: qty 1

## 2019-03-04 NOTE — ED Provider Notes (Signed)
[facility] EMERGENCY DEPARTMENT Provider Note   CSN: [id number] Arrival date & time: 03/04/19  1005     History Chief Complaint  Patient presents with  . Vaginal Discharge    Deborah Singleton is a 24 y.o. female.  Patient is a 24 year old female with past medical history of seizure disorder presenting to the emergency department for vaginal itching and irritation.  Patient reports that since yesterday she has had some itching and feels like the inside of her vagina is swelling.  Reports some white drainage discharge.  She is sexually active.  Denies any abdominal pain, dysuria, flank pain, fever, nausea, vomiting, problems with her menstrual cycle.        Past Medical History:  Diagnosis Date  . Seizures [facility])     Patient Active Problem List   Diagnosis Date Noted  . Generalized idiopathic epilepsy and epileptic syndromes, not intractable, without status epilepticus ([place]) 04/24/2018  . Supervision of normal first teen pregnancy in second trimester 01/20/2013  . Insufficient prenatal care in second trimester 01/20/2013    Past Surgical History:  Procedure Laterality Date  . TOOTH EXTRACTION       OB History    Gravida  1   Para      Term      Preterm      AB      Living  0     SAB      TAB      Ectopic      Multiple      Live Births              Family History  Problem Relation Age of Onset  . Asthma Mother   . Depression Sister     Social History   Tobacco Use  . Smoking status: Never Smoker  . Smokeless tobacco: Never Used  Substance Use Topics  . Alcohol use: Yes    Comment: Occasionally   . Drug use: No    Home Medications Prior to Admission medications   Medication Sig Start Date End Date Taking? Authorizing Provider  albuterol (ACCUNEB) 1.25 MG/3ML nebulizer solution Take 1 ampule by nebulization every 6 (six) hours as needed for wheezing or shortness of breath.    [provider]  benzonatate  (TESSALON) 100 MG capsule Take 1 capsule (100 mg total) by mouth every 8 (eight) hours. 12/26/18   Henderly, Britni A, PA-C  topiramate (TOPAMAX) 50 MG tablet Take 1.5 tablets twice a day Patient taking differently: Take 75 mg by mouth 2 (two) times daily.  04/24/18   Cameron Sprang, MD    Allergies    Acetaminophen  Review of Systems   Review of Systems  Constitutional: Negative for appetite change, chills and fever.  HENT: Negative for sore throat.   Respiratory: Negative for cough and shortness of breath.   Gastrointestinal: Negative for abdominal pain, nausea and vomiting.  Genitourinary: Positive for vaginal discharge and vaginal pain. Negative for decreased urine volume, difficulty urinating, dysuria, flank pain, genital sores, hematuria, pelvic pain, urgency and vaginal bleeding.  Musculoskeletal: Negative for back pain.  Skin: Negative for rash and wound.  Neurological: Negative for dizziness, light-headedness and headaches.    Physical Exam Updated Vital Signs BP 116/76 (BP Location: Left Arm)   Pulse 89   Temp 97.9 F (36.6 C) (Oral)   Resp 14   LMP 02/01/2019   SpO2 100%   Physical Exam Vitals and nursing note reviewed. Exam conducted with a  chaperone present.  Constitutional:      General: She is not in acute distress.    Appearance: Normal appearance. She is obese. She is not ill-appearing, toxic-appearing or diaphoretic.  HENT:     Head: Normocephalic.     Mouth/Throat:     Mouth: Mucous membranes are moist.  Eyes:     Conjunctiva/sclera: Conjunctivae normal.  Pulmonary:     Effort: Pulmonary effort is normal.  Genitourinary:    Vagina: Vaginal discharge (White cottage cheeselike) present. No erythema or bleeding.     Cervix: No cervical motion tenderness, erythema or cervical bleeding.     Adnexa:        Right: No tenderness or fullness.         Left: No tenderness or fullness.    Skin:    General: Skin is dry.  Neurological:     Mental Status: She  is alert.  Psychiatric:        Mood and Affect: Mood normal.     ED Results / Procedures / Treatments   Labs (all labs ordered are listed, but only abnormal results are displayed) Labs Reviewed  WET PREP, GENITAL - Abnormal; Notable for the following components:      Result Value   Yeast Wet Prep HPF POC PRESENT (*)    Trich, Wet Prep PRESENT (*)    Clue Cells Wet Prep HPF POC PRESENT (*)    WBC, Wet Prep HPF POC MANY (*)    All other components within normal limits  URINALYSIS, ROUTINE W REFLEX MICROSCOPIC - Abnormal; Notable for the following components:   APPearance CLOUDY (*)    Protein, ur 30 (*)    Leukocytes,Ua MODERATE (*)    Bacteria, UA RARE (*)    All other components within normal limits  PREGNANCY, URINE  GC/CHLAMYDIA PROBE AMP ([place]) NOT AT [facility]    EKG None  Radiology No results found.  Procedures Procedures (including critical care time)  Medications Ordered in ED Medications  metroNIDAZOLE (FLAGYL) tablet 2,000 mg (has no administration in time range)  fluconazole (DIFLUCAN) tablet 150 mg (has no administration in time range)  cefTRIAXone (ROCEPHIN) injection 500 mg (has no administration in time range)  azithromycin (ZITHROMAX) powder 1 g (has no administration in time range)    ED Course  I have reviewed the triage vital signs and the nursing notes.  Pertinent labs & imaging results that were available during my care of the patient were reviewed by me and considered in my medical decision making (see chart for details).  Clinical Course as of Mar 03 1133  Tue Mar 04, 2019  1130 Patient presenting with 1 day of vaginal discharge and irritation.  Patient is afebrile with no signs of pelvic inflammatory disease on my exam.  She appears very well.  Wet prep is significant for positive yeast, clue cells, trichomonas, white blood cells.  Urinalysis is positive for leukocytes, red blood cells, white blood cells and bacteria.  Patient is not having  dysuria.  Discussed with patient treatment for yeast and trichomonas.  Also discussed with her options of treatment for gonorrhea and chlamydia today rather than waiting.  Patient opted to take treatment for all of those today as well.  Advised her on the safe sex practices and letting her partners know of her diagnosis.  Also advised her to follow-up with the health department for test of cure.  Advise no sex until she is retested.  Advised on return precautions.   [  KM]    Clinical Course User Index [KM] Jeral Pinch   MDM Rules/Calculators/A&P                      Based on review of vitals, medical screening exam, lab work and/or imaging, there does not appear to be an acute, emergent etiology for the patient's symptoms. Counseled pt on good return precautions and encouraged both PCP and ED follow-up as needed.  Prior to discharge, I also discussed incidental imaging findings with patient in detail and advised appropriate, recommended follow-up in detail.  Clinical Impression: 1. Vaginal discharge   2. Trichomoniasis of vagina   3. Yeast vaginitis     Disposition: Discharge  Prior to providing a prescription for a controlled substance, I independently reviewed the patient's recent prescription history on the [place] Controlled Substance Reporting System. The patient had no recent or regular prescriptions and was deemed appropriate for a brief, less than 3 day prescription of narcotic for acute analgesia.  This note was prepared with assistance of Conservation officer, historic buildings. Occasional wrong-word or sound-a-like substitutions may have occurred due to the inherent limitations of voice recognition software.  Final Clinical Impression(s) / ED Diagnoses Final diagnoses:  Vaginal discharge  Trichomoniasis of vagina  Yeast vaginitis    Rx / DC Orders ED Discharge Orders    None       Arlyn Dunning, PA-C 03/04/19 1140    Sabas Sous, MD 03/07/19  [phone]

## 2019-03-04 NOTE — Discharge Instructions (Signed)
You are seen today for vaginal discharge and irritation.  Your wet prep swab was positive for a yeast infection as well as a trichomoniasis infection.  Trichomoniasis is a sexually transmitted infection.  We will not have the results of your gonorrhea and Chlamydia testing for a few days.  We have treated you today for a yeast, trichomoniasis, gonorrhea and chlamydia.  Please make sure you let your sexual partners know of your diagnosis so that they may be treated and tested as well.  Please make sure you avoid any sexual activity for at least 2 weeks and you follow-up with the health department or your primary care doctor to be retested to make sure all of your results come back negative. Thank you for allowing me to care for you today. Please return to the emergency department if you have new or worsening symptoms.

## 2019-03-04 NOTE — ED Notes (Signed)
Patient verbalizes understanding of discharge instructions. Opportunity for questioning and answers were provided. Armband removed by staff, pt discharged from ED. Ambulated out to lobby  

## 2019-03-04 NOTE — ED Triage Notes (Signed)
C/o white vaginal discharge and vaginal itching since yesterday.

## 2019-03-05 LAB — GC/CHLAMYDIA PROBE AMP (~~LOC~~) NOT AT ARMC
Chlamydia: NEGATIVE
Neisseria Gonorrhea: NEGATIVE

## 2019-03-06 ENCOUNTER — Encounter: Payer: Self-pay | Admitting: Emergency Medicine

## 2019-03-06 ENCOUNTER — Ambulatory Visit
Admission: EM | Admit: 2019-03-06 | Discharge: 2019-03-06 | Disposition: A | Discharge status: Home / Self Care | Payer: Medicaid Other | Attending: Emergency Medicine | Admitting: Emergency Medicine

## 2019-03-06 ENCOUNTER — Other Ambulatory Visit: Payer: Self-pay

## 2019-03-06 DIAGNOSIS — B9689 Other specified bacterial agents as the cause of diseases classified elsewhere: Secondary | ICD-10-CM | POA: Diagnosis not present

## 2019-03-06 DIAGNOSIS — N760 Acute vaginitis: Secondary | ICD-10-CM

## 2019-03-06 MED ORDER — FLUCONAZOLE 200 MG PO TABS: 200.0000 mg | ORAL_TABLET | Freq: Once | ORAL | 0 refills | Status: AC

## 2019-03-06 NOTE — ED Notes (Signed)
Patient able to ambulate independently  

## 2019-03-06 NOTE — ED Triage Notes (Signed)
Pt presents to [facility] for assessment after being treated for STIs recently in the ER.  States she is still having vaginal itching, and doesn't know if there is anything else she can take for her yeast.

## 2019-03-06 NOTE — Discharge Instructions (Addendum)
Take diflucan today, may repeat in 3 days. Can do warm bath for pain relief. Air dry at night.

## 2019-03-06 NOTE — ED Provider Notes (Addendum)
[facility]    CSN: [id number] Arrival date & time: 03/06/19  6440      History   Chief Complaint Chief Complaint  Patient presents with  . Vaginal Itching    HPI Deborah Singleton is a 24 y.o. female with history of seizures presenting for persistent vaginal irritation.  Patient was seen 03/04/2019 in ER for STD testing/treatment.  Please review this notes which were reviewed by me at time of appointment.  Patient empirically treated for G/C, trichomonas, yeast for symptoms while in the ER.  Wet prep showing trichomonas, yeast.  Patient still endorsing vaginal pruritus: Does have some improvement in discharge.  No vaginal or pelvic pain, abdominal or back pain, fevers.  Patient wondered if there is anything she can do for her itching.   Past Medical History:  Diagnosis Date  . Seizures [facility])     Patient Active Problem List   Diagnosis Date Noted  . Generalized idiopathic epilepsy and epileptic syndromes, not intractable, without status epilepticus (HCC) 04/24/2018  . Supervision of normal first teen pregnancy in second trimester 01/20/2013  . Insufficient prenatal care in second trimester 01/20/2013    Past Surgical History:  Procedure Laterality Date  . TOOTH EXTRACTION      OB History    Gravida  1   Para      Term      Preterm      AB      Living  0     SAB      TAB      Ectopic      Multiple      Live Births               Home Medications    Prior to Admission medications   Medication Sig Start Date End Date Taking? Authorizing Provider  albuterol (ACCUNEB) 1.25 MG/3ML nebulizer solution Take 1 ampule by nebulization every 6 (six) hours as needed for wheezing or shortness of breath.    [provider]  fluconazole (DIFLUCAN) 200 MG tablet Take 1 tablet (200 mg total) by mouth once for 1 dose. May repeat in 72 hours if needed 03/06/19 03/06/19  Hall-Potvin, [place], PA-C  topiramate (TOPAMAX) 50 MG tablet Take 1.5 tablets twice  a day Patient taking differently: Take 75 mg by mouth 2 (two) times daily.  04/24/18   Van Clines, MD    Family History Family History  Problem Relation Age of Onset  . Asthma Mother   . Depression Sister     Social History Social History   Tobacco Use  . Smoking status: Never Smoker  . Smokeless tobacco: Never Used  Substance Use Topics  . Alcohol use: Yes    Comment: Occasionally   . Drug use: No     Allergies   Acetaminophen   Review of Systems As per HPI   Physical Exam Triage Vital Signs ED Triage Vitals  Enc Vitals Group     BP      Pulse      Resp      Temp      Temp src      SpO2      Weight      Height      Head Circumference      Peak Flow      Pain Score      Pain Loc      Pain Edu?      Excl. in GC?  No data found.  Updated Vital Signs BP 111/68 (BP Location: Left Arm)   Pulse 84   Temp (!) 97.4 F (36.3 C) (Temporal)   Resp 16   LMP 02/03/2019   SpO2 97%   Visual Acuity Right Eye Distance:   Left Eye Distance:   Bilateral Distance:    Right Eye Near:   Left Eye Near:    Bilateral Near:     Physical Exam Constitutional:      General: She is not in acute distress. HENT:     Head: Normocephalic and atraumatic.  Eyes:     General: No scleral icterus.    Pupils: Pupils are equal, round, and reactive to light.  Cardiovascular:     Rate and Rhythm: Normal rate.  Pulmonary:     Effort: Pulmonary effort is normal.  Abdominal:     General: Bowel sounds are normal.     Palpations: Abdomen is soft.     Tenderness: There is no abdominal tenderness. There is no right CVA tenderness, left CVA tenderness or guarding.  Genitourinary:    Comments: Deferred Skin:    Coloration: Skin is not jaundiced or pale.  Neurological:     Mental Status: She is alert and oriented to person, place, and time.      UC Treatments / Results  Labs (all labs ordered are listed, but only abnormal results are displayed) Labs Reviewed - No  data to display  EKG   Radiology No results found.  Procedures Procedures (including critical care time)  Medications Ordered in UC Medications - No data to display  Initial Impression / Assessment and Plan / UC Course  I have reviewed the triage vital signs and the nursing notes.  Pertinent labs & imaging results that were available during my care of the patient were reviewed by me and considered in my medical decision making (see chart for details).     Patient afebrile, nontoxic.  Did undergo extensive antibiotic treatment for STDs on 2/2.  Patient denies history of yeast infection status post antibiotic use.  Reviewed the Diflucan may take a couple days to become fully effective, though she did only receive 1 dose of 150 mg.  Will resend 2 tabs today for patient to take today, 3 days if needed for persistent symptoms given recent antibiotic use.  Pregnancy test is negative on 2/2: Patient denies intercourse since that time-testing deferred.  Reviewed additional supportive care in office as outlined below.  Return precautions discussed, patient verbalized understanding and is agreeable to plan. Final Clinical Impressions(s) / UC Diagnoses   Final diagnoses:  None     Discharge Instructions     Take diflucan today, may repeat in 3 days. Can do warm bath for pain relief. Air dry at night.    ED Prescriptions    Medication Sig Dispense Auth. Provider   fluconazole (DIFLUCAN) 200 MG tablet Take 1 tablet (200 mg total) by mouth once for 1 dose. May repeat in 72 hours if needed 2 tablet Hall-Potvin, [place], PA-C     PDMP not reviewed this encounter.   Hall-Potvin, [place], PA-C 03/06/19 1024    Hall-Potvin, [place], [place] 03/06/19 1025

## 2019-03-24 ENCOUNTER — Other Ambulatory Visit: Payer: Self-pay

## 2019-03-24 ENCOUNTER — Encounter (HOSPITAL_COMMUNITY): Payer: Self-pay | Admitting: Emergency Medicine

## 2019-03-24 ENCOUNTER — Emergency Department (HOSPITAL_COMMUNITY)
Admission: EM | Admit: 2019-03-24 | Discharge: 2019-03-25 | Disposition: A | Discharge status: Home / Self Care | Payer: Medicaid Other | Attending: Emergency Medicine | Admitting: Emergency Medicine

## 2019-03-24 DIAGNOSIS — Z5321 Procedure and treatment not carried out due to patient leaving prior to being seen by health care provider: Secondary | ICD-10-CM | POA: Diagnosis not present

## 2019-03-24 DIAGNOSIS — R109 Unspecified abdominal pain: Secondary | ICD-10-CM | POA: Diagnosis not present

## 2019-03-24 LAB — LIPASE, BLOOD: Lipase: 35 U/L (ref 11–51)

## 2019-03-24 LAB — COMPREHENSIVE METABOLIC PANEL
ALT: 21 U/L (ref 0–44)
AST: 22 U/L (ref 15–41)
Albumin: 3.7 g/dL (ref 3.5–5.0)
Alkaline Phosphatase: 74 U/L (ref 38–126)
Anion gap: 8 (ref 5–15)
BUN: 10 mg/dL (ref 6–20)
CO2: 23 mmol/L (ref 22–32)
Calcium: 9.3 mg/dL (ref 8.9–10.3)
Chloride: 109 mmol/L (ref 98–111)
Creatinine, Ser: 0.97 mg/dL (ref 0.44–1.00)
GFR calc Af Amer: >60 mL/min (ref >60)
GFR calc non Af Amer: >60 mL/min (ref >60)
Glucose, Bld: 119 mg/dL — ABNORMAL HIGH (ref 70–99)
Potassium: 3.7 mmol/L (ref 3.5–5.1)
Sodium: 140 mmol/L (ref 135–145)
Total Bilirubin: 0.2 mg/dL — ABNORMAL LOW (ref 0.3–1.2)
Total Protein: 7.1 g/dL (ref 6.5–8.1)

## 2019-03-24 LAB — CBC
HCT: 39.7 % (ref 36.0–46.0)
Hemoglobin: 12.2 g/dL (ref 12.0–15.0)
MCH: 26.9 pg (ref 26.0–34.0)
MCHC: 30.7 g/dL (ref 30.0–36.0)
MCV: 87.6 fL (ref 80.0–100.0)
Platelets: 357 10*3/uL (ref 150–400)
RBC: 4.53 MIL/uL (ref 3.87–5.11)
RDW: 14.2 % (ref 11.5–15.5)
WBC: 7.6 10*3/uL (ref 4.0–10.5)
nRBC: 0 % (ref 0.0–0.2)

## 2019-03-24 MED ORDER — SODIUM CHLORIDE 0.9% FLUSH: 3.0000 mL | Freq: Once | INTRAVENOUS | Status: DC

## 2019-03-24 NOTE — ED Triage Notes (Signed)
Pt c/o abdominal pain x 1 week. Denies nausea/vomiting/diarrhea or urinary symptoms. Also c/o right breast pain.

## 2019-03-25 ENCOUNTER — Telehealth (HOSPITAL_COMMUNITY): Payer: Self-pay

## 2019-03-25 LAB — I-STAT BETA HCG BLOOD, ED (MC, WL, AP ONLY): I-stat hCG, quantitative: <5 m[IU]/mL (ref <5)

## 2019-03-25 NOTE — ED Notes (Signed)
PT reports she is leaving. PT seen walking out front door.

## 2019-05-01 ENCOUNTER — Ambulatory Visit
Admission: EM | Admit: 2019-05-01 | Discharge: 2019-05-01 | Disposition: A | Discharge status: Home / Self Care | Payer: Medicaid Other | Attending: Emergency Medicine | Admitting: Emergency Medicine

## 2019-05-01 DIAGNOSIS — Z3202 Encounter for pregnancy test, result negative: Secondary | ICD-10-CM

## 2019-05-01 DIAGNOSIS — R110 Nausea: Secondary | ICD-10-CM

## 2019-05-01 DIAGNOSIS — R1111 Vomiting without nausea: Secondary | ICD-10-CM | POA: Diagnosis not present

## 2019-05-01 LAB — POCT URINE PREGNANCY: Preg Test, Ur: NEGATIVE

## 2019-05-01 NOTE — Discharge Instructions (Signed)
Keep track of symptoms & how you're feeling & what you're eating. Flo is menstrual app - free to use. Important to schedule appointment with primary care.

## 2019-05-01 NOTE — ED Provider Notes (Signed)
[facility]    CSN: [id number] Arrival date & time: 05/01/19  0931      History   Chief Complaint Chief Complaint  Patient presents with  . Possible Pregnancy    HPI Deborah Singleton is a 24 y.o. female with history of seizures, topiramate, presenting for pregnancy test.  Patient believes LMP was 1 month ago, though does not keep track of her cycles.  States that she follows when her sister gets her cycles as she "gets some soon after ".  Patient denies irregular cycles, actively trying to get pregnant.  Last intercourse a few weeks ago.  Denying pelvic or vaginal pain, vaginal discharge, concern for STI.  Patient used to be on Depo, though stopped 1 to 2 years ago due to weight gain.  Patient has noticed "random "episodes of nausea without vomiting.  Patient denies increased belching, reflux, history of heartburn or GERD.  No history of gallbladder issues.  Has not noticed temporality or exacerbating factors for this.  Patient does note that her mother passed away in 2022-12-29, though states this is "not stressing [her] out".  Feels supported by family, friends.  Denies SI/HI.  Patient does not currently have PCP; states she has enough topiramate for a year.  No recent seizures.  Past Medical History:  Diagnosis Date  . Seizures [facility])     Patient Active Problem List   Diagnosis Date Noted  . Generalized idiopathic epilepsy and epileptic syndromes, not intractable, without status epilepticus ([place]) 04/24/2018  . Supervision of normal first teen pregnancy in second trimester 01/20/2013  . Insufficient prenatal care in second trimester 01/20/2013    Past Surgical History:  Procedure Laterality Date  . TOOTH EXTRACTION      OB History    Gravida  1   Para      Term      Preterm      AB      Living  0     SAB      TAB      Ectopic      Multiple      Live Births               Home Medications    Prior to Admission medications   Medication Sig  Start Date End Date Taking? Authorizing Provider  topiramate (TOPAMAX) 50 MG tablet Take 1.5 tablets twice a day Patient taking differently: Take 75 mg by mouth 2 (two) times daily.  04/24/18   Cameron Sprang, MD    Family History Family History  Problem Relation Age of Onset  . Asthma Mother   . Depression Sister     Social History Social History   Tobacco Use  . Smoking status: Never Smoker  . Smokeless tobacco: Never Used  Substance Use Topics  . Alcohol use: Yes    Comment: Occasionally   . Drug use: No     Allergies   Acetaminophen   Review of Systems As per HPI   Physical Exam Triage Vital Signs ED Triage Vitals  Enc Vitals Group     BP      Pulse      Resp      Temp      Temp src      SpO2      Weight      Height      Head Circumference      Peak Flow      Pain Score  Pain Loc      Pain Edu?      Excl. in GC?    No data found.  Updated Vital Signs BP 115/65   Pulse 92   Temp 98.9 F (37.2 C) (Oral)   Resp 16   LMP  (LMP Unknown)   Visual Acuity Right Eye Distance:   Left Eye Distance:   Bilateral Distance:    Right Eye Near:   Left Eye Near:    Bilateral Near:     Physical Exam Constitutional:      General: She is not in acute distress. HENT:     Head: Normocephalic and atraumatic.  Eyes:     General: No scleral icterus.    Pupils: Pupils are equal, round, and reactive to light.  Cardiovascular:     Rate and Rhythm: Normal rate and regular rhythm.  Pulmonary:     Effort: Pulmonary effort is normal. No respiratory distress.     Breath sounds: No wheezing.  Abdominal:     General: Bowel sounds are normal.     Palpations: Abdomen is soft.     Tenderness: There is no right CVA tenderness, left CVA tenderness, guarding or rebound.     Comments: Mild RUQ and epigastric TTP.  Negative Murphy's, McBurney's, Rovsing signs.  Skin:    Coloration: Skin is not jaundiced or pale.  Neurological:     Mental Status: She is alert  and oriented to person, place, and time.      UC Treatments / Results  Labs (all labs ordered are listed, but only abnormal results are displayed) Labs Reviewed  POCT URINE PREGNANCY    EKG   Radiology No results found.  Procedures Procedures (including critical care time)  Medications Ordered in UC Medications - No data to display  Initial Impression / Assessment and Plan / UC Course  I have reviewed the triage vital signs and the nursing notes.  Pertinent labs & imaging results that were available during my care of the patient were reviewed by me and considered in my medical decision making (see chart for details).     Urine pregnancy negative.  Discussed etiology of nausea including stress, GERD, cholelithiasis, less likely gastritis.  Patient to keep a log of symptoms to review with PCP.  Contact info for office provided.  Return precautions discussed, patient verbalized understanding and is agreeable to plan. Final Clinical Impressions(s) / UC Diagnoses   Final diagnoses:  Encounter for pregnancy test with result negative  Nausea without vomiting     Discharge Instructions     Keep track of symptoms & how you're feeling & what you're eating. Flo is menstrual app - free to use. Important to schedule appointment with primary care.    ED Prescriptions    None     PDMP not reviewed this encounter.   Hall-Potvin, [place], [place] 05/01/19 1026

## 2019-05-01 NOTE — ED Triage Notes (Signed)
Pt requesting pregnancy test. States not sure when her last cycle was. States having nausea some for over the last week.

## 2019-06-02 ENCOUNTER — Emergency Department (HOSPITAL_COMMUNITY): Payer: Medicaid Other

## 2019-06-02 ENCOUNTER — Encounter (HOSPITAL_COMMUNITY): Payer: Self-pay

## 2019-06-02 ENCOUNTER — Emergency Department (HOSPITAL_COMMUNITY)
Admission: EM | Admit: 2019-06-02 | Discharge: 2019-06-02 | Disposition: A | Discharge status: Home / Self Care | Payer: Medicaid Other | Attending: Emergency Medicine | Admitting: Emergency Medicine

## 2019-06-02 ENCOUNTER — Other Ambulatory Visit: Payer: Self-pay

## 2019-06-02 DIAGNOSIS — O039 Complete or unspecified spontaneous abortion without complication: Secondary | ICD-10-CM

## 2019-06-02 DIAGNOSIS — N939 Abnormal uterine and vaginal bleeding, unspecified: Secondary | ICD-10-CM | POA: Diagnosis not present

## 2019-06-02 DIAGNOSIS — R1010 Upper abdominal pain, unspecified: Secondary | ICD-10-CM | POA: Insufficient documentation

## 2019-06-02 DIAGNOSIS — K7689 Other specified diseases of liver: Secondary | ICD-10-CM | POA: Diagnosis not present

## 2019-06-02 HISTORY — DX: Complete or unspecified spontaneous abortion without complication: O03.9

## 2019-06-02 LAB — WET PREP, GENITAL
Clue Cells Wet Prep HPF POC: NONE SEEN
Sperm: NONE SEEN
Trich, Wet Prep: NONE SEEN
Yeast Wet Prep HPF POC: NONE SEEN

## 2019-06-02 LAB — CBC
HCT: 39.4 % (ref 36.0–46.0)
Hemoglobin: 12.1 g/dL (ref 12.0–15.0)
MCH: 26.6 pg (ref 26.0–34.0)
MCHC: 30.7 g/dL (ref 30.0–36.0)
MCV: 86.6 fL (ref 80.0–100.0)
Platelets: 360 10*3/uL (ref 150–400)
RBC: 4.55 MIL/uL (ref 3.87–5.11)
RDW: 13.9 % (ref 11.5–15.5)
WBC: 6.4 10*3/uL (ref 4.0–10.5)
nRBC: 0 % (ref 0.0–0.2)

## 2019-06-02 LAB — COMPREHENSIVE METABOLIC PANEL
ALT: 22 U/L (ref 0–44)
AST: 24 U/L (ref 15–41)
Albumin: 4.1 g/dL (ref 3.5–5.0)
Alkaline Phosphatase: 85 U/L (ref 38–126)
Anion gap: 8 (ref 5–15)
BUN: 7 mg/dL (ref 6–20)
CO2: 22 mmol/L (ref 22–32)
Calcium: 9 mg/dL (ref 8.9–10.3)
Chloride: 108 mmol/L (ref 98–111)
Creatinine, Ser: 0.74 mg/dL (ref 0.44–1.00)
GFR calc Af Amer: >60 mL/min (ref >60)
GFR calc non Af Amer: >60 mL/min (ref >60)
Glucose, Bld: 100 mg/dL — ABNORMAL HIGH (ref 70–99)
Potassium: 3.5 mmol/L (ref 3.5–5.1)
Sodium: 138 mmol/L (ref 135–145)
Total Bilirubin: 0.7 mg/dL (ref 0.3–1.2)
Total Protein: 7.8 g/dL (ref 6.5–8.1)

## 2019-06-02 LAB — RPR: RPR Ser Ql: NONREACTIVE

## 2019-06-02 LAB — I-STAT BETA HCG BLOOD, ED (MC, WL, AP ONLY): I-stat hCG, quantitative: <5 m[IU]/mL (ref <5)

## 2019-06-02 LAB — LIPASE, BLOOD: Lipase: 31 U/L (ref 11–51)

## 2019-06-02 LAB — HIV ANTIBODY (ROUTINE TESTING W REFLEX): HIV Screen 4th Generation wRfx: NONREACTIVE

## 2019-06-02 MED ORDER — OMEPRAZOLE 20 MG PO CPDR
20.0000 mg | DELAYED_RELEASE_CAPSULE | Freq: Every day | ORAL | 0 refills | Status: DC
Start: 2019-06-02 — End: 2019-07-11

## 2019-06-02 NOTE — ED Provider Notes (Signed)
[facility] DEPT Provider Note   CSN: [id number] Arrival date & time: 06/02/19  0820     History Chief Complaint  Patient presents with  . Vaginal Bleeding  . Abdominal Pain    Deborah Singleton is a 24 y.o. female.  HPI Patient presents with vaginal bleeding.  States that in April she had a normal menses but then began to have vaginal bleeding around a week later.  States she normally does not have bleeding after.  Has some upper abdominal cramping pain.  May be both better and worse after eating.  States vaginal bleeding has continued.  States she took a home pregnancy test that was "invalid".  States she had a negative pregnancy test but that was a month ago.    Past Medical History:  Diagnosis Date  . Seizures [facility])     Patient Active Problem List   Diagnosis Date Noted  . Generalized idiopathic epilepsy and epileptic syndromes, not intractable, without status epilepticus ([place]) 04/24/2018  . Supervision of normal first teen pregnancy in second trimester 01/20/2013  . Insufficient prenatal care in second trimester 01/20/2013    Past Surgical History:  Procedure Laterality Date  . TOOTH EXTRACTION       OB History    Gravida  1   Para      Term      Preterm      AB      Living  0     SAB      TAB      Ectopic      Multiple      Live Births              Family History  Problem Relation Age of Onset  . Asthma Mother   . Depression Sister     Social History   Tobacco Use  . Smoking status: Never Smoker  . Smokeless tobacco: Never Used  Substance Use Topics  . Alcohol use: Yes    Comment: Occasionally   . Drug use: No    Home Medications Prior to Admission medications   Medication Sig Start Date End Date Taking? Authorizing Provider  topiramate (TOPAMAX) 50 MG tablet Take 1.5 tablets twice a day Patient taking differently: Take 75 mg by mouth 2 (two) times daily.  04/24/18  Yes Cameron Sprang, MD   omeprazole (PRILOSEC) 20 MG capsule Take 1 capsule (20 mg total) by mouth daily. 06/02/19   Davonna Belling, MD    Allergies    Acetaminophen  Review of Systems   Review of Systems  Constitutional: Negative for appetite change.  HENT: Negative for congestion.   Cardiovascular: Negative for chest pain.  Gastrointestinal: Positive for abdominal pain. Negative for nausea.  Genitourinary: Positive for vaginal bleeding. Negative for vaginal discharge.  Musculoskeletal: Negative for back pain.  Skin: Negative for rash.  Neurological: Negative for weakness.  Psychiatric/Behavioral: Negative for confusion.    Physical Exam Updated Vital Signs BP 127/67   Pulse 70   Temp 98.6 F (37 C) (Oral)   Resp 17   Ht 5\' 1"  (1.549 m)   Wt 103.9 kg   LMP 06/02/2019   SpO2 100%   BMI 43.27 kg/m   Physical Exam Vitals and nursing note reviewed.  HENT:     Head: Normocephalic.  Cardiovascular:     Rate and Rhythm: Normal rate and regular rhythm.  Pulmonary:     Breath sounds: Normal breath sounds.  Abdominal:  Hernia: No hernia is present.     Comments: No upper or lower abdominal tenderness.  Skin:    General: Skin is warm.     Capillary Refill: Capillary refill takes less than 2 seconds.  Neurological:     Mental Status: She is alert and oriented to person, place, and time.   Pelvic exam showed some bloody cervical discharge.  No cervical motion tenderness.  No adnexal tenderness.  ED Results / Procedures / Treatments   Labs (all labs ordered are listed, but only abnormal results are displayed) Labs Reviewed  WET PREP, GENITAL - Abnormal; Notable for the following components:      Result Value   WBC, Wet Prep HPF POC RARE (*)    All other components within normal limits  COMPREHENSIVE METABOLIC PANEL - Abnormal; Notable for the following components:   Glucose, Bld 100 (*)    All other components within normal limits  LIPASE, BLOOD  CBC  RPR  HIV ANTIBODY (ROUTINE  TESTING W REFLEX)  I-STAT BETA HCG BLOOD, ED ([facility], [facility], [facility] ONLY)  GC/CHLAMYDIA PROBE AMP ([place]) NOT AT [facility]    EKG None  Radiology US Abdomen Limited  Result Date: 06/02/2019 CLINICAL DATA:  Right upper quadrant pain EXAM: ULTRASOUND ABDOMEN LIMITED RIGHT UPPER QUADRANT COMPARISON:  None. FINDINGS: Gallbladder: No gallstones or wall thickening visualized. No sonographic Murphy sign noted by sonographer. Common bile duct: Diameter: 3 mm, normal Liver: No focal lesion identified. Increase in parenchymal echogenicity. Portal vein is patent on color Doppler imaging with normal direction of blood flow towards the liver. Other: None. IMPRESSION: Increased liver echogenicity likely reflecting steatosis. Electronically Signed   By: Guadlupe Spanish M.D.   On: 06/02/2019 10:46    Procedures Procedures (including critical care time)  Medications Ordered in ED Medications - No data to display  ED Course  I have reviewed the triage vital signs and the nursing notes.  Pertinent labs & imaging results that were available during my care of the patient were reviewed by me and considered in my medical decision making (see chart for details).    MDM Rules/Calculators/A&P                      Patient with vaginal bleeding.  Not pregnant.  Benign pelvic exam.  Outpatient follow-up. Also upper abdominal pain.  Has had for a month.  Somewhat worse with eating.  Ultrasound done and reassuring.  Also outpatient follow-up.  Will treat with omeprazole. Final Clinical Impression(s) / ED Diagnoses Final diagnoses:  Vaginal bleeding  Pain of upper abdomen    Rx / DC Orders ED Discharge Orders         Ordered    omeprazole (PRILOSEC) 20 MG capsule  Daily     06/02/19 1200           Benjiman Core, MD 06/02/19 1208

## 2019-06-02 NOTE — Discharge Instructions (Signed)
Follow-up with your primary care doctor for the abdominal pain and the abnormal vaginal bleeding

## 2019-06-02 NOTE — ED Triage Notes (Addendum)
Patient states she had her normal period in April and has had spotting afterwards. Patient states she has been having heavy bleeding x 1 week. Patient states she took a home pregnancy test and it was "invalid" Patient also c/o intermittent abdominal cramping x 1 week.

## 2019-06-03 LAB — GC/CHLAMYDIA PROBE AMP (~~LOC~~) NOT AT ARMC
Chlamydia: NEGATIVE
Comment: NEGATIVE
Comment: NORMAL
Neisseria Gonorrhea: NEGATIVE

## 2019-06-05 ENCOUNTER — Encounter (HOSPITAL_COMMUNITY): Payer: Self-pay | Admitting: Student

## 2019-06-05 ENCOUNTER — Other Ambulatory Visit: Payer: Self-pay

## 2019-06-05 ENCOUNTER — Emergency Department (HOSPITAL_COMMUNITY)
Admission: EM | Admit: 2019-06-05 | Discharge: 2019-06-05 | Disposition: A | Discharge status: Home / Self Care | Payer: Medicaid Other | Attending: Emergency Medicine | Admitting: Emergency Medicine

## 2019-06-05 DIAGNOSIS — O039 Complete or unspecified spontaneous abortion without complication: Secondary | ICD-10-CM | POA: Diagnosis not present

## 2019-06-05 DIAGNOSIS — G40909 Epilepsy, unspecified, not intractable, without status epilepticus: Secondary | ICD-10-CM | POA: Diagnosis not present

## 2019-06-05 DIAGNOSIS — R569 Unspecified convulsions: Secondary | ICD-10-CM

## 2019-06-05 DIAGNOSIS — Z79899 Other long term (current) drug therapy: Secondary | ICD-10-CM | POA: Diagnosis not present

## 2019-06-05 DIAGNOSIS — R404 Transient alteration of awareness: Secondary | ICD-10-CM | POA: Diagnosis not present

## 2019-06-05 DIAGNOSIS — R0902 Hypoxemia: Secondary | ICD-10-CM | POA: Diagnosis not present

## 2019-06-05 DIAGNOSIS — I10 Essential (primary) hypertension: Secondary | ICD-10-CM | POA: Diagnosis not present

## 2019-06-05 LAB — CBC WITH DIFFERENTIAL/PLATELET
Abs Immature Granulocytes: 0.03 K/uL (ref 0.00–0.07)
Basophils Absolute: 0 K/uL (ref 0.0–0.1)
Basophils Relative: 0 %
Eosinophils Absolute: 0.2 K/uL (ref 0.0–0.5)
Eosinophils Relative: 2 %
HCT: 38.4 % (ref 36.0–46.0)
Hemoglobin: 11.8 g/dL — ABNORMAL LOW (ref 12.0–15.0)
Immature Granulocytes: 0 %
Lymphocytes Relative: 23 %
Lymphs Abs: 1.8 K/uL (ref 0.7–4.0)
MCH: 26.9 pg (ref 26.0–34.0)
MCHC: 30.7 g/dL (ref 30.0–36.0)
MCV: 87.5 fL (ref 80.0–100.0)
Monocytes Absolute: 0.8 K/uL (ref 0.1–1.0)
Monocytes Relative: 11 %
Neutro Abs: 5.1 K/uL (ref 1.7–7.7)
Neutrophils Relative %: 64 %
Platelets: 350 K/uL (ref 150–400)
RBC: 4.39 MIL/uL (ref 3.87–5.11)
RDW: 13.6 % (ref 11.5–15.5)
WBC: 7.9 K/uL (ref 4.0–10.5)
nRBC: 0 % (ref 0.0–0.2)

## 2019-06-05 LAB — BASIC METABOLIC PANEL
Anion gap: 7 (ref 5–15)
BUN: <5 mg/dL — ABNORMAL LOW (ref 6–20)
CO2: 24 mmol/L (ref 22–32)
Calcium: 9.3 mg/dL (ref 8.9–10.3)
Chloride: 110 mmol/L (ref 98–111)
Creatinine, Ser: 0.87 mg/dL (ref 0.44–1.00)
GFR calc Af Amer: >60 mL/min (ref >60)
GFR calc non Af Amer: >60 mL/min (ref >60)
Glucose, Bld: 101 mg/dL — ABNORMAL HIGH (ref 70–99)
Potassium: 4.1 mmol/L (ref 3.5–5.1)
Sodium: 141 mmol/L (ref 135–145)

## 2019-06-05 LAB — I-STAT BETA HCG BLOOD, ED (MC, WL, AP ONLY): I-stat hCG, quantitative: <5 m[IU]/mL

## 2019-06-05 LAB — RAPID URINE DRUG SCREEN, HOSP PERFORMED
Amphetamines: NOT DETECTED
Barbiturates: NOT DETECTED
Benzodiazepines: NOT DETECTED
Cocaine: NOT DETECTED
Opiates: NOT DETECTED
Tetrahydrocannabinol: NOT DETECTED

## 2019-06-05 LAB — CBG MONITORING, ED: Glucose-Capillary: 96 mg/dL (ref 70–99)

## 2019-06-05 MED ORDER — LORAZEPAM 2 MG/ML IJ SOLN
1.0000 mg | Freq: Once | INTRAMUSCULAR | Status: AC
  Administered 2019-06-05: 1 mg via INTRAVENOUS
  Filled 2019-06-05: qty 1

## 2019-06-05 MED ORDER — LORAZEPAM 2 MG/ML IJ SOLN: 1.0000 mg | Freq: Once | INTRAMUSCULAR | Status: DC | PRN

## 2019-06-05 NOTE — ED Provider Notes (Signed)
[facility]-EMERGENCY DEPT Provider Note   CSN: [id number] Arrival date & time: 06/05/19  1023     History Chief Complaint  Patient presents with  . Seizures  . Migraine    Deborah Singleton is a 24 y.o. female.  24 year old female with history of seizures and migraines brought in by EMS for seizures, felt like she was zoning in and out this morning. Mom came to the house and laid her on her side and called EMS, reports seizure described as eyes closed with whole body shaking, lasted 1 second, then stopped, then again for 5 seconds, then again for about 5 seconds, followed by snoring/postictal period that lasted until in the ambulance on the way to the ER. Did not loose control of bowel or bladder (did void prior to episode), did not bite tongue- mom reports putting in her mouth guard. Last seizure was January 2020, takes Topomax BID, reports compliant, unsure what triggered her seizure this morning.  Denies alcohol or drug use. Reports frontal headache with pain in her eyes, no photophobia, similar to prior headaches.          Past Medical History:  Diagnosis Date  . Miscarriage 06/02/2019  . Seizures [facility])     Patient Active Problem List   Diagnosis Date Noted  . Generalized idiopathic epilepsy and epileptic syndromes, not intractable, without status epilepticus (HCC) 04/24/2018  . Supervision of normal first teen pregnancy in second trimester 01/20/2013  . Insufficient prenatal care in second trimester 01/20/2013    Past Surgical History:  Procedure Laterality Date  . TOOTH EXTRACTION       OB History    Gravida  1   Para      Term      Preterm      AB      Living  0     SAB      TAB      Ectopic      Multiple      Live Births              Family History  Problem Relation Age of Onset  . Asthma Mother   . Depression Sister     Social History   Tobacco Use  . Smoking status: Never Smoker  . Smokeless tobacco: Never  Used  Substance Use Topics  . Alcohol use: Yes    Comment: Occasionally   . Drug use: No    Home Medications Prior to Admission medications   Medication Sig Start Date End Date Taking? Authorizing Provider  topiramate (TOPAMAX) 50 MG tablet Take 1.5 tablets twice a day Patient taking differently: Take 75 mg by mouth 2 (two) times daily.  04/24/18  Yes Van Clines, MD  omeprazole (PRILOSEC) 20 MG capsule Take 1 capsule (20 mg total) by mouth daily. 06/02/19   Benjiman Core, MD    Allergies    Acetaminophen  Review of Systems   Review of Systems  Constitutional: Negative for fever.  HENT: Negative for mouth sores.   Gastrointestinal: Negative for abdominal pain, constipation, diarrhea, nausea and vomiting.  Genitourinary: Negative for difficulty urinating and dysuria.  Musculoskeletal: Negative for arthralgias and myalgias.  Skin: Negative for rash and wound.  Allergic/Immunologic: Negative for immunocompromised state.  Neurological: Positive for seizures. Negative for weakness.  Psychiatric/Behavioral: Negative for confusion.  All other systems reviewed and are negative.   Physical Exam Updated Vital Signs BP 103/87   Pulse 87   Temp 98.1  F (36.7 C) (Oral)   Resp 16   Ht 5\' 1"  (1.549 m)   Wt 103.9 kg   LMP 06/02/2019   SpO2 97%   BMI 43.27 kg/m   Physical Exam Vitals and nursing note reviewed.  Constitutional:      General: She is not in acute distress.    Appearance: She is well-developed. She is not diaphoretic.  HENT:     Head: Normocephalic and atraumatic.     Mouth/Throat:     Mouth: Mucous membranes are moist.  Eyes:     Extraocular Movements: Extraocular movements intact.     Pupils: Pupils are equal, round, and reactive to light.  Cardiovascular:     Rate and Rhythm: Normal rate and regular rhythm.     Pulses: Normal pulses.     Heart sounds: Normal heart sounds.  Pulmonary:     Effort: Pulmonary effort is normal.     Breath sounds: Normal  breath sounds.  Abdominal:     Palpations: Abdomen is soft.     Tenderness: There is no abdominal tenderness.  Musculoskeletal:        General: No swelling or tenderness.     Cervical back: Neck supple. No tenderness.  Skin:    General: Skin is warm and dry.     Capillary Refill: Capillary refill takes less than 2 seconds.     Findings: No erythema or rash.  Neurological:     General: No focal deficit present.     Mental Status: She is alert and oriented to person, place, and time.     Cranial Nerves: No cranial nerve deficit.     Motor: No weakness.     Gait: Gait normal.  Psychiatric:        Behavior: Behavior normal.     ED Results / Procedures / Treatments   Labs (all labs ordered are listed, but only abnormal results are displayed) Labs Reviewed  BASIC METABOLIC PANEL - Abnormal; Notable for the following components:      Result Value   Glucose, Bld 101 (*)    BUN <5 (*)    All other components within normal limits  CBC WITH DIFFERENTIAL/PLATELET - Abnormal; Notable for the following components:   Hemoglobin 11.8 (*)    All other components within normal limits  RAPID URINE DRUG SCREEN, HOSP PERFORMED  CBG MONITORING, ED  I-STAT BETA HCG BLOOD, ED ([facility], [facility], [facility] ONLY)    EKG None  Radiology No results found.  Procedures Procedures (including critical care time)  Medications Ordered in ED Medications  LORazepam (ATIVAN) injection 1 mg (has no administration in time range)  LORazepam (ATIVAN) injection 1 mg (1 mg Intravenous Given 06/05/19 1201)    ED Course  I have reviewed the triage vital signs and the nursing notes.  Pertinent labs & imaging results that were available during my care of the patient were reviewed by me and considered in my medical decision making (see chart for details).  Clinical Course as of Jun 05 1727  Thu Jun 05, 2019  [id number] 24 year old female presents with complaint of seizure today, reports compliance with her medications.  No  injuries as result of her seizure.  Urine drug screen negative, remaining lab work including CBC and BMP unremarkable, hCG negative.  Discussed with Dr. 21, ER attending.  Patient was observed in the ER, given Ativan, no additional seizure activity, was alert on arrival. Patient will be discharged with increase in her Topamax to 3 times daily  dosing with follow-up with her neurologist.  Was advised not to drive until cleared by neurology.   [LM]    Clinical Course User Index [LM] Roque Lias   MDM Rules/Calculators/A&P                      Final Clinical Impression(s) / ED Diagnoses Final diagnoses:  Seizures [facility])    Rx / DC Orders ED Discharge Orders    None       Roque Lias 06/05/19 1729    Lacretia Leigh, MD 06/09/19 1208

## 2019-06-05 NOTE — Discharge Instructions (Addendum)
Increase your Topamax to three times daily and follow up with your neurologist. Do NOT drive until cleared by your neurologist.

## 2019-06-05 NOTE — Progress Notes (Addendum)
TOC CM chart reviewed. Pt had 5 ED visits in past six months. No PCP listed. Attempted call to pt's cell phone to assist with scheduling a follow up appt. Isidoro Donning RN CCM, [facility] ED TOC CM [phone]  5:06 pm Able to reach pt using her mother's contact, pt states she goes to [facility]. Pt may have aged out of that practice. Gave me permission to arrange a follow up appt. Mother wants me to send information to her phone as pt's phone is off at this time. Isidoro Donning RN CCM, [facility] ED TOC CM [phone]

## 2019-06-05 NOTE — ED Notes (Signed)
Patient arrived via [facility] for seizure.  Family reported 3 seizures this morning, each lasting 10-15 seconds, with last one being at 0940.  Patient has hx of seizure with last one being a year ago.  Patient takes her meds everyday.  Reports miscarriage last week.  Patient also complaining of headache with EMS and stated she had a headache this morning before seizure.

## 2019-07-11 ENCOUNTER — Ambulatory Visit
Admission: EM | Admit: 2019-07-11 | Discharge: 2019-07-11 | Disposition: A | Discharge status: Home / Self Care | Payer: Medicaid Other | Attending: Physician Assistant | Admitting: Physician Assistant

## 2019-07-11 ENCOUNTER — Encounter: Payer: Self-pay | Admitting: Physician Assistant

## 2019-07-11 ENCOUNTER — Other Ambulatory Visit: Payer: Self-pay

## 2019-07-11 DIAGNOSIS — N898 Other specified noninflammatory disorders of vagina: Secondary | ICD-10-CM | POA: Insufficient documentation

## 2019-07-11 LAB — POCT URINE PREGNANCY: Preg Test, Ur: NEGATIVE

## 2019-07-11 NOTE — ED Triage Notes (Addendum)
Pt c/o thick, white vaginal discharge x 1 week. Denies new sex partners

## 2019-07-11 NOTE — ED Provider Notes (Signed)
[facility]    CSN: [id number] Arrival date & time: 07/11/19  1249      History   Chief Complaint Chief Complaint  Patient presents with  . Vaginal Discharge    HPI Deborah Singleton is a 24 y.o. female.   24 year old female comes in for 1 week history of vaginal discharge. No itching, odor. Denies urinary symptoms such as frequency, dysuria, hematuria. Denies fever, chills, body aches. Denies abdominal pain, nausea, vomiting. Unknown last LMP. Sexually active with 1 partner, no condom use, no birth control use. No obvious changes in hygiene product.      Past Medical History:  Diagnosis Date  . Miscarriage 06/02/2019  . Seizures [facility])     Patient Active Problem List   Diagnosis Date Noted  . Generalized idiopathic epilepsy and epileptic syndromes, not intractable, without status epilepticus (HCC) 04/24/2018  . Supervision of normal first teen pregnancy in second trimester 01/20/2013  . Insufficient prenatal care in second trimester 01/20/2013    Past Surgical History:  Procedure Laterality Date  . TOOTH EXTRACTION      OB History    Gravida  1   Para      Term      Preterm      AB      Living  0     SAB      TAB      Ectopic      Multiple      Live Births               Home Medications    Prior to Admission medications   Medication Sig Start Date End Date Taking? Authorizing Provider  topiramate (TOPAMAX) 50 MG tablet Take 1.5 tablets twice a day Patient taking differently: Take 75 mg by mouth 2 (two) times daily.  04/24/18   Van Clines, MD    Family History Family History  Problem Relation Age of Onset  . Asthma Mother   . Depression Sister     Social History Social History   Tobacco Use  . Smoking status: Never Smoker  . Smokeless tobacco: Never Used  Vaping Use  . Vaping Use: Never used  Substance Use Topics  . Alcohol use: Yes    Comment: Occasionally   . Drug use: No     Allergies     Acetaminophen   Review of Systems Review of Systems  Reason unable to perform ROS: See HPI as above.     Physical Exam Triage Vital Signs ED Triage Vitals [07/11/19 1254]  Enc Vitals Group     BP 120/76     Pulse Rate 96     Resp 16     Temp 98.6 F (37 C)     Temp Source Oral     SpO2 96 %     Weight      Height      Head Circumference      Peak Flow      Pain Score      Pain Loc      Pain Edu?      Excl. in GC?    No data found.  Updated Vital Signs BP 120/76 (BP Location: Left Arm)   Pulse 96   Temp 98.6 F (37 C) (Oral)   Resp 16   LMP  (LMP Unknown)   SpO2 96%   Physical Exam Constitutional:      General: She is not in acute distress.  Appearance: She is well-developed. She is not ill-appearing, toxic-appearing or diaphoretic.  HENT:     Head: Normocephalic and atraumatic.  Eyes:     Conjunctiva/sclera: Conjunctivae normal.     Pupils: Pupils are equal, round, and reactive to light.  Cardiovascular:     Rate and Rhythm: Normal rate and regular rhythm.  Pulmonary:     Effort: Pulmonary effort is normal. No respiratory distress.     Comments: LCTAB Abdominal:     General: Bowel sounds are normal.     Palpations: Abdomen is soft.     Tenderness: There is no abdominal tenderness. There is no right CVA tenderness, left CVA tenderness, guarding or rebound.  Musculoskeletal:     Cervical back: Normal range of motion and neck supple.  Skin:    General: Skin is warm and dry.  Neurological:     Mental Status: She is alert and oriented to person, place, and time.  Psychiatric:        Behavior: Behavior normal.        Judgment: Judgment normal.      UC Treatments / Results  Labs (all labs ordered are listed, but only abnormal results are displayed) Labs Reviewed  POCT URINE PREGNANCY - Normal  CERVICOVAGINAL ANCILLARY ONLY    EKG   Radiology No results found.  Procedures Procedures (including critical care time)  Medications Ordered  in UC Medications - No data to display  Initial Impression / Assessment and Plan / UC Course  I have reviewed the triage vital signs and the nursing notes.  Pertinent labs & imaging results that were available during my care of the patient were reviewed by me and considered in my medical decision making (see chart for details).    Urine pregnancy negative. Will await for cytology results for treatment. Cytology sent, patient will be contacted with any positive results that require additional treatment. Return precautions given.   Final Clinical Impressions(s) / UC Diagnoses   Final diagnoses:  Vaginal discharge    ED Prescriptions    None     PDMP not reviewed this encounter.   Ok Edwards, PA-C 07/11/19 1343

## 2019-07-11 NOTE — Discharge Instructions (Signed)
Urine pregnancy negative. Cytology sent, you will be contacted with any positive results that requires further treatment.  Monitor for any worsening of symptoms, fever, abdominal pain, nausea, vomiting, to follow up for reevaluation.

## 2019-07-14 ENCOUNTER — Telehealth (HOSPITAL_COMMUNITY): Payer: Self-pay | Admitting: Orthopedic Surgery

## 2019-07-14 LAB — CERVICOVAGINAL ANCILLARY ONLY
Bacterial Vaginitis (gardnerella): POSITIVE — AB
Candida Glabrata: NEGATIVE
Candida Vaginitis: NEGATIVE
Chlamydia: NEGATIVE
Comment: NEGATIVE
Comment: NEGATIVE
Comment: NEGATIVE
Comment: NEGATIVE
Comment: NEGATIVE
Comment: NORMAL
Neisseria Gonorrhea: NEGATIVE
Trichomonas: POSITIVE — AB

## 2019-07-14 MED ORDER — METRONIDAZOLE 500 MG PO TABS: 500.0000 mg | ORAL_TABLET | Freq: Two times a day (BID) | ORAL | 0 refills | Status: DC

## 2019-09-22 ENCOUNTER — Ambulatory Visit
Admission: EM | Admit: 2019-09-22 | Discharge: 2019-09-22 | Disposition: A | Discharge status: Home / Self Care | Payer: Medicaid Other | Attending: Emergency Medicine | Admitting: Emergency Medicine

## 2019-09-22 ENCOUNTER — Other Ambulatory Visit: Payer: Self-pay

## 2019-09-22 ENCOUNTER — Encounter: Payer: Self-pay | Admitting: Emergency Medicine

## 2019-09-22 DIAGNOSIS — J029 Acute pharyngitis, unspecified: Secondary | ICD-10-CM | POA: Diagnosis not present

## 2019-09-22 DIAGNOSIS — Z1152 Encounter for screening for COVID-19: Secondary | ICD-10-CM | POA: Diagnosis not present

## 2019-09-22 MED ORDER — IBUPROFEN 800 MG PO TABS
800.0000 mg | ORAL_TABLET | Freq: Three times a day (TID) | ORAL | 0 refills | Status: DC
Start: 2019-09-22 — End: 2019-11-04

## 2019-09-22 NOTE — ED Triage Notes (Signed)
Pt here for sore throat and sts was around someone who may have been exposed to covid

## 2019-09-22 NOTE — Discharge Instructions (Addendum)
Your COVID test is pending - it is important to quarantine / isolate at home until your results are back. °If you test positive and would like further evaluation for persistent or worsening symptoms, you may schedule an E-visit or virtual (video) visit throughout the [facility] MyChart app or website. ° °PLEASE NOTE: If you develop severe chest pain or shortness of breath please go to the ER or call 9-1-1 for further evaluation --> DO NOT schedule electronic or virtual visits for this. °Please call our office for further guidance / recommendations as needed. ° °For information about the Covid vaccine, please visit [email or web address] °

## 2019-09-22 NOTE — ED Provider Notes (Signed)
[facility]    CSN: [id number] Arrival date & time: 09/22/19  1909      History   Chief Complaint Chief Complaint  Patient presents with  . Sore Throat    HPI Deborah Singleton is a 24 y.o. female   Presenting for Covid testing: Exposure: Friend  Date of exposure: Last week Any fever, symptoms since exposure: Yes-mild sore throat.  No cough, fever, shortness of breath,  or difficulty swallowing.   Past Medical History:  Diagnosis Date  . Miscarriage 06/02/2019  . Seizures [facility])     Patient Active Problem List   Diagnosis Date Noted  . Generalized idiopathic epilepsy and epileptic syndromes, not intractable, without status epilepticus (HCC) 04/24/2018  . Supervision of normal first teen pregnancy in second trimester 01/20/2013  . Insufficient prenatal care in second trimester 01/20/2013    Past Surgical History:  Procedure Laterality Date  . TOOTH EXTRACTION      OB History    Gravida  1   Para      Term      Preterm      AB      Living  0     SAB      TAB      Ectopic      Multiple      Live Births               Home Medications    Prior to Admission medications   Medication Sig Start Date End Date Taking? Authorizing Provider  ibuprofen (ADVIL) 800 MG tablet Take 1 tablet (800 mg total) by mouth 3 (three) times daily. 09/22/19   Hall-Potvin, [place], PA-C  topiramate (TOPAMAX) 50 MG tablet Take 1.5 tablets twice a day Patient taking differently: Take 75 mg by mouth 2 (two) times daily.  04/24/18   Van Clines, MD    Family History Family History  Problem Relation Age of Onset  . Asthma Mother   . Depression Sister     Social History Social History   Tobacco Use  . Smoking status: Never Smoker  . Smokeless tobacco: Never Used  Vaping Use  . Vaping Use: Never used  Substance Use Topics  . Alcohol use: Yes    Comment: Occasionally   . Drug use: No     Allergies   Acetaminophen   Review of  Systems As per HPI   Physical Exam Triage Vital Signs ED Triage Vitals  Enc Vitals Group     BP      Pulse      Resp      Temp      Temp src      SpO2      Weight      Height      Head Circumference      Peak Flow      Pain Score      Pain Loc      Pain Edu?      Excl. in GC?    No data found.  Updated Vital Signs BP 119/86 (BP Location: Left Arm)   Pulse 86   Temp 98.3 F (36.8 C) (Oral)   Resp 18   SpO2 98%   Visual Acuity Right Eye Distance:   Left Eye Distance:   Bilateral Distance:    Right Eye Near:   Left Eye Near:    Bilateral Near:     Physical Exam Constitutional:      General: She is  not in acute distress. HENT:     Head: Normocephalic and atraumatic.     Mouth/Throat:     Pharynx: Uvula midline. No posterior oropharyngeal erythema or uvula swelling.     Tonsils: 1+ on the right. 1+ on the left.  Eyes:     General: No scleral icterus.    Pupils: Pupils are equal, round, and reactive to light.  Cardiovascular:     Rate and Rhythm: Normal rate.  Pulmonary:     Effort: Pulmonary effort is normal.  Skin:    Coloration: Skin is not jaundiced or pale.  Neurological:     Mental Status: She is alert and oriented to person, place, and time.      UC Treatments / Results  Labs (all labs ordered are listed, but only abnormal results are displayed) Labs Reviewed  NOVEL CORONAVIRUS, NAA    EKG   Radiology No results found.  Procedures Procedures (including critical care time)  Medications Ordered in UC Medications - No data to display  Initial Impression / Assessment and Plan / UC Course  I have reviewed the triage vital signs and the nursing notes.  Pertinent labs & imaging results that were available during my care of the patient were reviewed by me and considered in my medical decision making (see chart for details).     Patient afebrile, nontoxic, with SpO2 98%.  Covid PCR pending.  Patient to quarantine until results are  back.  We will treat supportively as outlined below.  Return precautions discussed, patient verbalized understanding and is agreeable to plan. Final Clinical Impressions(s) / UC Diagnoses   Final diagnoses:  Encounter for screening for COVID-19  Sore throat     Discharge Instructions     Your COVID test is pending - it is important to quarantine / isolate at home until your results are back. If you test positive and would like further evaluation for persistent or worsening symptoms, you may schedule an E-visit or virtual (video) visit throughout the [facility] app or website.  PLEASE NOTE: If you develop severe chest pain or shortness of breath please go to the ER or call 9-1-1 for further evaluation --> DO NOT schedule electronic or virtual visits for this. Please call our office for further guidance / recommendations as needed.  For information about the Covid vaccine, please visit [email or web address]    ED Prescriptions    Medication Sig Dispense Auth. Provider   ibuprofen (ADVIL) 800 MG tablet Take 1 tablet (800 mg total) by mouth 3 (three) times daily. 21 tablet Hall-Potvin, [place], PA-C     PDMP not reviewed this encounter.   Hall-Potvin, [place], [place] 09/23/19 0820

## 2019-09-23 ENCOUNTER — Encounter: Payer: Self-pay | Admitting: Emergency Medicine

## 2019-09-25 LAB — SARS-COV-2, NAA 2 DAY TAT

## 2019-09-25 LAB — NOVEL CORONAVIRUS, NAA: SARS-CoV-2, NAA: NOT DETECTED

## 2019-09-26 ENCOUNTER — Encounter (HOSPITAL_COMMUNITY): Payer: Self-pay | Admitting: *Deleted

## 2019-09-26 ENCOUNTER — Other Ambulatory Visit: Payer: Self-pay

## 2019-09-26 ENCOUNTER — Emergency Department (HOSPITAL_COMMUNITY)
Admission: EM | Admit: 2019-09-26 | Discharge: 2019-09-26 | Disposition: A | Discharge status: Home / Self Care | Payer: Medicaid Other | Attending: Emergency Medicine | Admitting: Emergency Medicine

## 2019-09-26 DIAGNOSIS — R569 Unspecified convulsions: Secondary | ICD-10-CM

## 2019-09-26 DIAGNOSIS — G4489 Other headache syndrome: Secondary | ICD-10-CM | POA: Diagnosis not present

## 2019-09-26 DIAGNOSIS — E162 Hypoglycemia, unspecified: Secondary | ICD-10-CM | POA: Diagnosis not present

## 2019-09-26 DIAGNOSIS — G40909 Epilepsy, unspecified, not intractable, without status epilepticus: Secondary | ICD-10-CM | POA: Diagnosis not present

## 2019-09-26 DIAGNOSIS — Z79899 Other long term (current) drug therapy: Secondary | ICD-10-CM | POA: Diagnosis not present

## 2019-09-26 DIAGNOSIS — I213 ST elevation (STEMI) myocardial infarction of unspecified site: Secondary | ICD-10-CM | POA: Diagnosis not present

## 2019-09-26 DIAGNOSIS — E161 Other hypoglycemia: Secondary | ICD-10-CM | POA: Diagnosis not present

## 2019-09-26 LAB — CBC WITH DIFFERENTIAL/PLATELET
Abs Immature Granulocytes: 0.02 10*3/uL (ref 0.00–0.07)
Basophils Absolute: 0 10*3/uL (ref 0.0–0.1)
Basophils Relative: 1 %
Eosinophils Absolute: 0.2 10*3/uL (ref 0.0–0.5)
Eosinophils Relative: 2 %
HCT: 36 % (ref 36.0–46.0)
Hemoglobin: 11.4 g/dL — ABNORMAL LOW (ref 12.0–15.0)
Immature Granulocytes: 0 %
Lymphocytes Relative: 40 %
Lymphs Abs: 3.2 10*3/uL (ref 0.7–4.0)
MCH: 27.3 pg (ref 26.0–34.0)
MCHC: 31.7 g/dL (ref 30.0–36.0)
MCV: 86.3 fL (ref 80.0–100.0)
Monocytes Absolute: 0.7 10*3/uL (ref 0.1–1.0)
Monocytes Relative: 9 %
Neutro Abs: 3.9 10*3/uL (ref 1.7–7.7)
Neutrophils Relative %: 48 %
Platelets: 327 10*3/uL (ref 150–400)
RBC: 4.17 MIL/uL (ref 3.87–5.11)
RDW: 13.9 % (ref 11.5–15.5)
WBC: 8 10*3/uL (ref 4.0–10.5)
nRBC: 0 % (ref 0.0–0.2)

## 2019-09-26 LAB — I-STAT BETA HCG BLOOD, ED (MC, WL, AP ONLY): I-stat hCG, quantitative: <5 m[IU]/mL (ref <5)

## 2019-09-26 LAB — BASIC METABOLIC PANEL
Anion gap: 7 (ref 5–15)
BUN: 10 mg/dL (ref 6–20)
CO2: 23 mmol/L (ref 22–32)
Calcium: 8.6 mg/dL — ABNORMAL LOW (ref 8.9–10.3)
Chloride: 106 mmol/L (ref 98–111)
Creatinine, Ser: 0.68 mg/dL (ref 0.44–1.00)
GFR calc Af Amer: >60 mL/min (ref >60)
GFR calc non Af Amer: >60 mL/min (ref >60)
Glucose, Bld: 100 mg/dL — ABNORMAL HIGH (ref 70–99)
Potassium: 3.8 mmol/L (ref 3.5–5.1)
Sodium: 136 mmol/L (ref 135–145)

## 2019-09-26 MED ORDER — TOPIRAMATE 25 MG PO TABS
75.0000 mg | ORAL_TABLET | Freq: Once | ORAL | Status: AC
  Administered 2019-09-26: 75 mg via ORAL
  Filled 2019-09-26: qty 3

## 2019-09-26 MED ORDER — ONDANSETRON HCL 4 MG/2ML IJ SOLN
4.0000 mg | Freq: Once | INTRAMUSCULAR | Status: AC
  Administered 2019-09-26: 4 mg via INTRAVENOUS
  Filled 2019-09-26: qty 2

## 2019-09-26 NOTE — ED Triage Notes (Signed)
BIB EMS due to seizure like activity, reported not like her normal seizure, pt feels like she usually does as she comes out of seizure. ? Dehydration. Dizziness, EKG obtained, # 20 LAC, 4 mg Zofran for nausea, 174/79-86-18-97.2-CBG 123

## 2019-09-26 NOTE — ED Provider Notes (Signed)
[facility]-EMERGENCY DEPT Provider Note   CSN: [id number] Arrival date & time: 09/26/19  1846     History Chief Complaint  Patient presents with  . Seizures    Deborah Singleton is a 24 y.o. female.  24 year old female with prior medical history detailed below presents for evaluation of possible seizure.  Patient with longstanding history of seizure.  She reports that her last known seizure was in June.  This evening she was taking a nap.  Per her family she had a seizure while asleep.  EMS found her at home after being called by family.  EMS reported that she was apparently mildly postictal.  Upon arrival to the ED the patient is back to her normal mental status.  She complains of a mild headache and "feels like I had a seizure."  Patient reports that she is compliant with her Topamax.  She denies recent injury or illness.  The history is provided by the patient and medical records.  Seizures Seizure activity on arrival: no   Postictal symptoms: confusion   Return to baseline: yes   Severity:  Mild Timing:  Once Progression:  Resolved Recent head injury:  No recent head injuries History of seizures: yes        Past Medical History:  Diagnosis Date  . Miscarriage 06/02/2019  . Seizures [facility])     Patient Active Problem List   Diagnosis Date Noted  . Generalized idiopathic epilepsy and epileptic syndromes, not intractable, without status epilepticus (HCC) 04/24/2018  . Supervision of normal first teen pregnancy in second trimester 01/20/2013  . Insufficient prenatal care in second trimester 01/20/2013    Past Surgical History:  Procedure Laterality Date  . TOOTH EXTRACTION       OB History    Gravida  1   Para      Term      Preterm      AB      Living  0     SAB      TAB      Ectopic      Multiple      Live Births              Family History  Problem Relation Age of Onset  . Asthma Mother   . Depression Sister      Social History   Tobacco Use  . Smoking status: Never Smoker  . Smokeless tobacco: Never Used  Vaping Use  . Vaping Use: Never used  Substance Use Topics  . Alcohol use: Yes    Comment: Occasionally   . Drug use: No    Home Medications Prior to Admission medications   Medication Sig Start Date End Date Taking? Authorizing Provider  ibuprofen (ADVIL) 800 MG tablet Take 1 tablet (800 mg total) by mouth 3 (three) times daily. 09/22/19   Hall-Potvin, [place], PA-C  topiramate (TOPAMAX) 50 MG tablet Take 1.5 tablets twice a day Patient taking differently: Take 75 mg by mouth 2 (two) times daily.  04/24/18   Van Clines, MD    Allergies    Acetaminophen  Review of Systems   Review of Systems  Neurological: Positive for seizures.  All other systems reviewed and are negative.   Physical Exam Updated Vital Signs BP (!) 142/76 (BP Location: Right Arm)   Pulse 79   Temp 98.6 F (37 C) (Oral)   Resp 14   SpO2 100%   Physical Exam Vitals and nursing note reviewed.  Constitutional:      General: She is not in acute distress.    Appearance: She is well-developed.  HENT:     Head: Normocephalic and atraumatic.  Eyes:     Conjunctiva/sclera: Conjunctivae normal.     Pupils: Pupils are equal, round, and reactive to light.  Cardiovascular:     Rate and Rhythm: Normal rate and regular rhythm.     Heart sounds: Normal heart sounds.  Pulmonary:     Effort: Pulmonary effort is normal. No respiratory distress.     Breath sounds: Normal breath sounds.  Abdominal:     General: There is no distension.     Palpations: Abdomen is soft.     Tenderness: There is no abdominal tenderness.  Musculoskeletal:        General: No deformity. Normal range of motion.     Cervical back: Normal range of motion and neck supple.  Skin:    General: Skin is warm and dry.  Neurological:     General: No focal deficit present.     Mental Status: She is alert and oriented to person, place,  and time. Mental status is at baseline.     Cranial Nerves: No cranial nerve deficit.     Sensory: No sensory deficit.     Motor: No weakness.     Coordination: Coordination normal.     ED Results / Procedures / Treatments   Labs (all labs ordered are listed, but only abnormal results are displayed) Labs Reviewed  BASIC METABOLIC PANEL - Abnormal; Notable for the following components:      Result Value   Glucose, Bld 100 (*)    Calcium 8.6 (*)    All other components within normal limits  CBC WITH DIFFERENTIAL/PLATELET - Abnormal; Notable for the following components:   Hemoglobin 11.4 (*)    All other components within normal limits  I-STAT BETA HCG BLOOD, ED ([facility], [facility], [facility] ONLY)    EKG None  Radiology No results found.  Procedures Procedures (including critical care time)  Medications Ordered in ED Medications  ondansetron (ZOFRAN) injection 4 mg (4 mg Intravenous Given 09/26/19 2005)  topiramate (TOPAMAX) tablet 75 mg (75 mg Oral Given 09/26/19 2122)    ED Course  I have reviewed the triage vital signs and the nursing notes.  Pertinent labs & imaging results that were available during my care of the patient were reviewed by me and considered in my medical decision making (see chart for details).    MDM Rules/Calculators/A&P                           MDM  Screen complete  Deborah Singleton was evaluated in Emergency Department on 09/26/2019 for the symptoms described in the history of present illness. She was evaluated in the context of the global COVID-19 pandemic, which necessitated consideration that the patient might be at risk for infection with the SARS-CoV-2 virus that causes COVID-19. Institutional protocols and algorithms that pertain to the evaluation of patients at risk for COVID-19 are in a state of rapid change based on information released by regulatory bodies including the CDC and federal and state organizations. These policies and algorithms were followed  during the patient's care in the ED.   Presenting for evaluation following a possible breakthrough seizure.  Upon my evaluation she is returned to her mental status baseline.  She is without current complaint.  Screening labs did not reveal significant acute pathology.  Importance of close follow-up was stressed.  Strict return precautions given and understood.  Patient reports that she has an adequate amount of Topamax at home.  She declines refill on same.  Final Clinical Impression(s) / ED Diagnoses Final diagnoses:  Seizure [facility])    Rx / DC Orders ED Discharge Orders    None       Wynetta Fines, MD 09/26/19 2137

## 2019-09-26 NOTE — Discharge Instructions (Addendum)
Please return for any problem.  Follow-up with neurology as instructed.  Continue your antiepileptic medications as previously prescribed.

## 2019-09-29 ENCOUNTER — Telehealth: Payer: Self-pay | Admitting: *Deleted

## 2019-09-30 ENCOUNTER — Telehealth: Payer: Self-pay | Admitting: *Deleted

## 2019-09-30 DIAGNOSIS — G40309 Generalized idiopathic epilepsy and epileptic syndromes, not intractable, without status epilepticus: Secondary | ICD-10-CM

## 2019-09-30 NOTE — Telephone Encounter (Addendum)
Contacted patient to complete Transition of Care Assessment: Transition Care Management Follow-up Telephone Call  Date of discharge and from where: 09/26/19, [facility]  How have you been since you were released from the hospital? "Still weak due to [facility] out at my apt". I'm not sure if my seizure medicine works because it is so hot in there". The patient says that it has been out for 8 months. The patient says she has spoken to her land lord many times but nothing as been done.  Any questions or concerns? Yes Patient states I'm not sure if my seizure medicine works because it is so hot in there". She is also concerned that she is not able to work a full shift because of seizures, migraines, and dizziness even if she takes her medication.  Items Reviewed:  Did the pt receive and understand the discharge instructions provided? Yes   Medications obtained and verified? Yes   Any new allergies since your discharge? No   Dietary orders reviewed?  Yes  Do you have support at home? Yes   Functional Questionnaire: (I = Independent and D = Dependent) ADLs: I  Bathing/Dressing- I  Meal Prep-  I  Eating- I  Maintaining continence-  I  Transferring/Ambulation-  I  Managing Meds- I  Follow up appointments reviewed:   PCP Hospital f/u appt confirmed? Yes  Scheduled to see [facility] Provider on 10/01/19 @1115 .  Specialist Hospital f/u appt confirmed? No  Patient to call [facility] to schedule follow up appt; Patient given number to office  Are transportation arrangements needed? No   If their condition worsens, is the pt aware to call PCP or go to the Emergency Dept.? YES  Was the patient provided with contact information for the PCP's office or ED? YYES Was to pt encouraged to call back with questions or concerns? YES  Order placed for [facility] Coordination due to patient (CM and SW) concerns listed above.  ,

## 2019-10-01 ENCOUNTER — Ambulatory Visit: Payer: Medicaid Other

## 2019-10-01 NOTE — Progress Notes (Deleted)
° °@  Patient ID: Deborah Singleton, female    DOB: [birth year], 24 y.o.   MRN: [id number]  No chief complaint on file.   Referring provider: No ref. provider found   24 year old female with history of seizures.  Recent significant events:  09/26/2019 ED visit: Evaluated for possible breakthrough seizure.  Patient reported being compliant with medications.  Advised to make follow-up appointment with neurology.  HPI  Seizure:  History of seizures - currently stable  Please continue Topamax as directed  Will make appointment for follow up with neurology  Will make appointment to establish care with new PCP  Follow up:  As needed    Allergies  Allergen Reactions   Acetaminophen Itching     There is no immunization history on file for this patient.  Past Medical History:  Diagnosis Date   Miscarriage 06/02/2019   Seizures (HCC)     Tobacco History: Social History   Tobacco Use  Smoking Status Never Smoker  Smokeless Tobacco Never Used   Counseling given: Not Answered   Outpatient Encounter Medications as of 10/01/2019  Medication Sig   ibuprofen (ADVIL) 800 MG tablet Take 1 tablet (800 mg total) by mouth 3 (three) times daily.   topiramate (TOPAMAX) 50 MG tablet Take 1.5 tablets twice a day (Patient taking differently: Take 75 mg by mouth 2 (two) times daily. )   No facility-administered encounter medications on file as of 10/01/2019.     Review of Systems  Review of Systems     Physical Exam  There were no vitals taken for this visit.  Wt Readings from Last 5 Encounters:  06/05/19 229 lb (103.9 kg)  06/02/19 229 lb (103.9 kg)  02/17/18 208 lb (94.3 kg)  12/03/14 190 lb (86.2 kg) (97 %, Z= 1.84)*  02/18/13 153 lb 6.4 oz (69.6 kg) (89 %, Z= 1.21)*   * Growth percentiles are based on CDC (Girls, 2-20 Years) data.     Physical Exam   Lab Results:  CBC    Component Value Date/Time   WBC 8.0 09/26/2019 2004   RBC 4.17 09/26/2019 2004    HGB 11.4 (L) 09/26/2019 2004   HCT 36.0 09/26/2019 2004   PLT 327 09/26/2019 2004   MCV 86.3 09/26/2019 2004   MCH 27.3 09/26/2019 2004   MCHC 31.7 09/26/2019 2004   RDW 13.9 09/26/2019 2004   LYMPHSABS 3.2 09/26/2019 2004   MONOABS 0.7 09/26/2019 2004   EOSABS 0.2 09/26/2019 2004   BASOSABS 0.0 09/26/2019 2004    BMET    Component Value Date/Time   NA 136 09/26/2019 2004   K 3.8 09/26/2019 2004   CL 106 09/26/2019 2004   CO2 23 09/26/2019 2004   GLUCOSE 100 (H) 09/26/2019 2004   BUN 10 09/26/2019 2004   CREATININE 0.68 09/26/2019 2004   CALCIUM 8.6 (L) 09/26/2019 2004   GFRNONAA >60 09/26/2019 2004   GFRAA >60 09/26/2019 2004    BNP No results found for: BNP  ProBNP No results found for: PROBNP  Imaging: No results found.   Assessment & Plan:   No problem-specific Assessment & Plan notes found for this encounter.     Ivonne Andrew, NP 10/01/2019

## 2019-10-02 ENCOUNTER — Other Ambulatory Visit: Payer: Self-pay | Admitting: *Deleted

## 2019-10-02 NOTE — Patient Outreach (Signed)
[facility]) Care Management  10/02/2019  Deborah Singleton 07/24/1995 [id number]   Referral Received: 9/1 Managed MCD Telephone Assessment: 9/2  RN attempted the initial outreach however unsuccessful. RN able to leave a HIPAA approved voice message requesting a call back.   Plan: Will reschedule another outreach call next week for pending services.  Elliot Cousin, RN Care Management Coordinator [facility] Main Office [phone]

## 2019-10-09 ENCOUNTER — Other Ambulatory Visit: Payer: Self-pay | Admitting: *Deleted

## 2019-10-09 NOTE — Patient Outreach (Signed)
[facility]) Care Management  10/09/2019  Celine Habenicht 20-Sep-1995 [id number]   Telephone Assessment-Unsuccessful (2nd outreach)  RN attempted outreach call to the pt's mobile number however unsuccessful. RN able to leave a HIPAA approvded voice message requesting a call back.  Plan: Will rescheduled another outreach call over the next week.  Elliot Cousin, RN Care Management Coordinator [facility] Main Office [phone]

## 2019-10-15 ENCOUNTER — Other Ambulatory Visit: Payer: Self-pay | Admitting: *Deleted

## 2019-10-15 NOTE — Patient Outreach (Signed)
[facility]) Care Management  10/15/2019  Deborah Singleton 01-22-1996 [id number]   Telephone Screen Managed Medicaid  Outreach #3 RN spoke with pt today and explained [facility] services and the purpose for today's call. Pt receptive however unable to talk at this time and requested a call back on Friday morning.   RN will follow up with pt on Friday morning as requested to further engage with pending services via [facility].  Elliot Cousin, RN Care Management Coordinator [facility] Main Office [phone]

## 2019-10-16 ENCOUNTER — Ambulatory Visit: Payer: Self-pay

## 2019-10-16 ENCOUNTER — Other Ambulatory Visit: Payer: Self-pay | Admitting: *Deleted

## 2019-10-16 NOTE — Patient Outreach (Signed)
[facility]) Care Management  10/16/2019  Deborah Singleton 1995-03-20 [id number]   RN complex case management will close this case to allow the Managed MCD Team to initiate [facility] services. THis discipline will close case with no further contact at this time.  MMCD Team updated accordingly.  Elliot Cousin, RN Care Management Coordinator [facility] Main Office [phone]

## 2019-10-17 ENCOUNTER — Ambulatory Visit: Payer: Self-pay | Admitting: *Deleted

## 2019-10-21 ENCOUNTER — Other Ambulatory Visit: Payer: Self-pay

## 2019-10-21 NOTE — Patient Outreach (Signed)
Care Coordination  10/21/2019  Deborah Singleton 02/06/1995 [id number]  Third unsuccessful telephone outreach was attempted today. The patient was referred to the case management team for assistance with care management and care coordination. The patient has no primary care provider to be notified of our unsuccessful attempts to make or maintain contact with the patient. The care management team is pleased to engage with this patient at any time in the future should he/she be interested in assistance from the care management team.   Follow Up Plan: A HIPAA compliant phone message was left for the patient providing contact information and requesting a return call at [phone].   Roselyn Bering, MSW, LCSW Social Work Case Production designer, theatre/television/film - Medicaid Managed Care [facility]  [facility]  Direct Dial: [phone]

## 2019-10-21 NOTE — Patient Instructions (Signed)
Visit Information  Ms. Granzow  - as a part of your Medicaid benefit, you are eligible for care management and care coordination services. I was unable to reach you by phone today but would be happy to help you with your health related needs. Please feel free to call me @ [phone].   A HIPAA compliant phone message was left for the patient providing contact information and requesting a return call.  The patient has been provided with contact information for the Managed Medicaid care management team and has been advised to call with any health related questions or concerns.    Roselyn Bering, MSW, LCSW Social Work Case Production designer, theatre/television/film - Medicaid Managed Care [facility]   [facility]  Direct Dial: [phone]

## 2019-11-04 ENCOUNTER — Ambulatory Visit
Admission: EM | Admit: 2019-11-04 | Discharge: 2019-11-04 | Disposition: A | Discharge status: Home / Self Care | Payer: Medicaid Other | Attending: Emergency Medicine | Admitting: Emergency Medicine

## 2019-11-04 ENCOUNTER — Other Ambulatory Visit: Payer: Self-pay

## 2019-11-04 DIAGNOSIS — W19XXXA Unspecified fall, initial encounter: Secondary | ICD-10-CM | POA: Diagnosis not present

## 2019-11-04 DIAGNOSIS — M25521 Pain in right elbow: Secondary | ICD-10-CM | POA: Diagnosis not present

## 2019-11-04 DIAGNOSIS — M79651 Pain in right thigh: Secondary | ICD-10-CM

## 2019-11-04 DIAGNOSIS — R0789 Other chest pain: Secondary | ICD-10-CM

## 2019-11-04 MED ORDER — CYCLOBENZAPRINE HCL 5 MG PO TABS: 5.0000 mg | ORAL_TABLET | Freq: Two times a day (BID) | ORAL | 0 refills | Status: AC | PRN

## 2019-11-04 MED ORDER — IBUPROFEN 800 MG PO TABS: 800.0000 mg | ORAL_TABLET | Freq: Three times a day (TID) | ORAL | 0 refills | Status: AC

## 2019-11-04 NOTE — ED Triage Notes (Signed)
Pt presents with complaints of falling off the trunk of a car yesterday. States that she was sitting on the trunk and fell off face first hitting her chest, face, right arm and right leg. Patient has an abrasion to her right elbow and right thigh. Pt denies any loc.

## 2019-11-04 NOTE — ED Provider Notes (Signed)
[facility]    CSN: [id number] Arrival date & time: 11/04/19  7824      History   Chief Complaint Chief Complaint  Patient presents with  . Fall    HPI Deborah Singleton is a 24 y.o. female  Presenting for general evaluation s/p fall. Patient provides history: States she fell off the back of a car trunk day before last. No head trauma, LOC, vomiting, visual or auditory changes, memory issues or irritability since. Noted abrasion to right elbow, has had some sternal chest pain without shortness of breath, palpitations. Also noting abrasion to right thigh. Able to ambulate. Has not taken thing for this. 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  Past Medical History:  Diagnosis Date  . Miscarriage 06/02/2019  . Seizures [facility])     Patient Active Problem List   Diagnosis Date Noted  . Generalized idiopathic epilepsy and epileptic syndromes, not intractable, without status epilepticus (HCC) 04/24/2018  . Supervision of normal first teen pregnancy in second trimester 01/20/2013  . Insufficient prenatal care in second trimester 01/20/2013    Past Surgical History:  Procedure Laterality Date  . TOOTH EXTRACTION      OB History    Gravida  1   Para      Term      Preterm      AB      Living  0     SAB      TAB      Ectopic      Multiple      Live Births               Home Medications    Prior to Admission medications   Medication Sig Start Date End Date Taking? Authorizing Provider  cyclobenzaprine (FLEXERIL) 5 MG tablet Take 1 tablet (5 mg total) by mouth 2 (two) times daily as needed for up to 7 days for muscle spasms. 11/04/19 11/11/19  Hall-Potvin, [place], PA-C  ibuprofen (ADVIL) 800 MG tablet Take 1 tablet (800 mg total) by mouth 3 (three) times daily for 10 days. 11/04/19 11/14/19  Hall-Potvin, [place], PA-C  topiramate (TOPAMAX) 50 MG tablet Take 1.5 tablets twice a day Patient taking differently: Take 75 mg by mouth 2 (two) times  daily.  04/24/18   Van Clines, MD    Family History Family History  Problem Relation Age of Onset  . Asthma Mother   . Depression Sister   . Healthy Father     Social History Social History   Tobacco Use  . Smoking status: Never Smoker  . Smokeless tobacco: Never Used  Vaping Use  . Vaping Use: Never used  Substance Use Topics  . Alcohol use: Yes    Comment: Occasionally   . Drug use: No     Allergies   Acetaminophen   Review of Systems As per HPI   Physical Exam Triage Vital Signs ED Triage Vitals [11/04/19 0933]  Enc Vitals Group     BP 123/77     Pulse Rate 91     Resp 16     Temp 98.1 F (36.7 C)     Temp Source Oral     SpO2 95 %     Weight      Height      Head Circumference      Peak Flow      Pain Score      Pain Loc      Pain Edu?  Excl. in GC?    No data found.  Updated Vital Signs BP 123/77 (BP Location: Left Arm)   Pulse 91   Temp 98.1 F (36.7 C) (Oral)   Resp 16   LMP 11/04/2019   SpO2 95%   Visual Acuity Right Eye Distance:   Left Eye Distance:   Bilateral Distance:    Right Eye Near:   Left Eye Near:    Bilateral Near:     Physical Exam Vitals reviewed.  Constitutional:      General: She is not in acute distress. HENT:     Head: Normocephalic and atraumatic.     Right Ear: Tympanic membrane, ear canal and external ear normal.     Left Ear: Tympanic membrane, ear canal and external ear normal.     Nose: Nose normal.     Mouth/Throat:     Mouth: Mucous membranes are moist.     Pharynx: Oropharynx is clear. No oropharyngeal exudate or posterior oropharyngeal erythema.  Eyes:     General: No scleral icterus.       Right eye: No discharge.        Left eye: No discharge.     Extraocular Movements: Extraocular movements intact.     Conjunctiva/sclera: Conjunctivae normal.     Pupils: Pupils are equal, round, and reactive to light.  Cardiovascular:     Rate and Rhythm: Normal rate and regular rhythm.      Heart sounds: Normal heart sounds.  Pulmonary:     Effort: Pulmonary effort is normal. No respiratory distress.     Breath sounds: No wheezing or rhonchi.  Chest:     Chest wall: No tenderness.    Abdominal:     General: Abdomen is flat. Bowel sounds are normal. There is no distension.     Palpations: Abdomen is soft.     Tenderness: There is no abdominal tenderness. There is no right CVA tenderness, left CVA tenderness or guarding.  Musculoskeletal:        General: No swelling. Normal range of motion.     Cervical back: Normal range of motion and neck supple. No rigidity. No muscular tenderness.     Comments: Full active range of motion of upper and lower extremities with 5/5 strength bilaterally and symmetric.  Lymphadenopathy:     Cervical: No cervical adenopathy.  Skin:    General: Skin is warm.     Capillary Refill: Capillary refill takes less than 2 seconds.     Coloration: Skin is not jaundiced or pale.     Comments: Mild, superficial abrasion to both right elbow, right thigh. No active discharge, crepitus or swelling in either limb. Both are neurovascularly intact.  Neurological:     Mental Status: She is alert and oriented to person, place, and time.     Cranial Nerves: No cranial nerve deficit.     Sensory: No sensory deficit.     Motor: No weakness.     Coordination: Coordination normal.     Gait: Gait normal.     Deep Tendon Reflexes: Reflexes normal.  Psychiatric:        Mood and Affect: Mood normal.        Thought Content: Thought content normal.        Judgment: Judgment normal.      UC Treatments / Results  Labs (all labs ordered are listed, but only abnormal results are displayed) Labs Reviewed - No data to display  EKG   Radiology No results  found.  Procedures Procedures (including critical care time)  Medications Ordered in UC Medications - No data to display  Initial Impression / Assessment and Plan / UC Course  I have reviewed the triage  vital signs and the nursing notes.  Pertinent labs & imaging results that were available during my care of the patient were reviewed by me and considered in my medical decision making (see chart for details).     Patient fell from approximately 2 feet off the ground. Superficial abrasions and no focal swelling or bruising. Exam reassuring at this time. Will treat supportively as outlined below.  Provided work note at [employer] request.  Return precautions discussed, pt verbalized understanding and is agreeable to plan. Final Clinical Impressions(s) / UC Diagnoses   Final diagnoses:  Fall, initial encounter  Right elbow pain  Right thigh pain  Mid sternal chest pain     Discharge Instructions     RICE: rest, ice, compression, elevation as needed for pain.    Pain medication:  350 mg-1000 mg of Tylenol (acetaminophen) and/or 200 mg - 800 mg of Advil (ibuprofen, Motrin) every 8 hours as needed.  May alternate between the two throughout the day as they are generally safe to take together.  DO NOT exceed more than 3000 mg of Tylenol or 3200 mg of ibuprofen in a 24 hour period as this could damage your stomach, kidneys, liver, or increase your bleeding risk.   May take muscle relaxer as needed for severe pain / spasm.  (This medication may cause you to become tired so it is important you do not drink alcohol or operate heavy machinery while on this medication.  Recommend your first dose to be taken before bedtime to monitor for side effects safely)    ED Prescriptions    Medication Sig Dispense Auth. Provider   ibuprofen (ADVIL) 800 MG tablet Take 1 tablet (800 mg total) by mouth 3 (three) times daily for 10 days. 30 tablet Hall-Potvin, [place], PA-C   cyclobenzaprine (FLEXERIL) 5 MG tablet Take 1 tablet (5 mg total) by mouth 2 (two) times daily as needed for up to 7 days for muscle spasms. 14 tablet Hall-Potvin, [place], PA-C     PDMP not reviewed this encounter.   Hall-Potvin, [place],  [place] 11/04/19 1015

## 2019-11-04 NOTE — Discharge Instructions (Addendum)
RICE: rest, ice, compression, elevation as needed for pain.    Pain medication:  350 mg-1000 mg of Tylenol (acetaminophen) and/or 200 mg - 800 mg of Advil (ibuprofen, Motrin) every 8 hours as needed.  May alternate between the two throughout the day as they are generally safe to take together.  DO NOT exceed more than 3000 mg of Tylenol or 3200 mg of ibuprofen in a 24 hour period as this could damage your stomach, kidneys, liver, or increase your bleeding risk.  May take muscle relaxer as needed for severe pain / spasm.  (This medication may cause you to become tired so it is important you do not drink alcohol or operate heavy machinery while on this medication.  Recommend your first dose to be taken before bedtime to monitor for side effects safely) 

## 2019-12-09 ENCOUNTER — Other Ambulatory Visit: Payer: Self-pay

## 2019-12-09 NOTE — Patient Outreach (Signed)
Care Coordination - Case Manager  12/09/2019  [name] 1995/06/04 [id number]  Subjective:  Deborah Singleton is an 24 y.o. year old female who is a primary patient of Patient, No Pcp Per.  Ms. Buonocore was given information about Medicaid Managed Care team care coordination services today. Deborah Singleton agreed to services and verbal consent obtained  Review of patient status, laboratory and other test data was performed as part of evaluation for provision of services.  SDOH: SDOH Screenings   Alcohol Screen:   . Last Alcohol Screening Score (AUDIT): Not on file  Depression (PHQ2-9):   . PHQ-2 Score: Not on file  Financial Resource Strain:   . Difficulty of Paying Living Expenses: Not on file  Food Insecurity:   . Worried About Programme researcher, broadcasting/film/video in the Last Year: Not on file  . Ran Out of Food in the Last Year: Not on file  Housing:   . Last Housing Risk Score: Not on file  Physical Activity:   . Days of Exercise per Week: Not on file  . Minutes of Exercise per Session: Not on file  Social Connections:   . Frequency of Communication with Friends and Family: Not on file  . Frequency of Social Gatherings with Friends and Family: Not on file  . Attends Religious Services: Not on file  . Active Member of Clubs or Organizations: Not on file  . Attends Banker Meetings: Not on file  . Marital Status: Not on file  Stress:   . Feeling of Stress : Not on file  Tobacco Use: Low Risk   . Smoking Tobacco Use: Never Smoker  . Smokeless Tobacco Use: Never Used  Transportation Needs:   . Freight forwarder (Medical): Not on file  . Lack of Transportation (Non-Medical): Not on file     Objective:    Allergies  Allergen Reactions  . Acetaminophen Itching    Medications:    Medications Reviewed Today    Reviewed by Danie Chandler, RN (Registered Nurse) on 12/09/19 at [phone]  Med List Status: <None>  Medication Order Taking? Sig Documenting Provider Last  Dose Status Informant  topiramate (TOPAMAX) 50 MG tablet [id number] Yes Take 1.5 tablets twice a day  Patient taking differently: Take 75 mg by mouth 2 (two) times daily.    Van Clines, MD Taking Active Self          Assessment:   Goals Addressed            This Visit's Progress   . Make and Keep All Appointments       CARE PLAN ENTRY Medicaid Managed Care (see longitudinal plan of care for additional care plan information)  Current Barriers:  Marland Kitchen Knowledge Deficits related to treatment of headaches.  Nurse Case Manager Clinical Goal(s):  Marland Kitchen Over the next 45 days, patient will verbalize understanding of plan for headaches . Over the next 45 days, patient will work with provider to address needs related to headaches . Over the next 45 days, patient will attend all scheduled medical appointments:  Interventions:  . Inter-disciplinary care team collaboration (see longitudinal plan of care) . Advised patient to keep headache diary  . Reviewed medications with patient. . Discussed plans with patient for ongoing care management follow up and provided patient with direct contact information for care management team . Reviewed scheduled/upcoming provider appointment.  Plan:  . Patient will attend scheduled appointments. Marland Kitchen RNCM will follow up with patient within 45 days.  Initial goal documentation        Plan: RNCM will follow up with patient within 45 days.

## 2019-12-09 NOTE — Patient Instructions (Signed)
Hi Ms. Desilva, thank you for speaking with me today.  Ms. Goodlin was given information about Medicaid Managed Care team care coordination services as a part of their Healthy [facility] Medicaid benefit. Mairlyn Shimkus verbally consented to engagement with the [facility] Managed Care team.   For questions related to your Healthy [facility] health plan, please call: [phone] or visit the homepage here: [email or web address]  If you would like to schedule transportation through your Healthy [facility] plan, please call the following number at least 2 days in advance of your appointment: [phone]  Goals Addressed            This Visit's Progress   . Make and Keep All Appointments       CARE PLAN ENTRY Medicaid Managed Care (see longitudinal plan of care for additional care plan information)  Current Barriers:  Marland Kitchen Knowledge Deficits related to treatment of headaches.  Nurse Case Manager Clinical Goal(s):  Marland Kitchen Over the next 45 days, patient will verbalize understanding of plan for headaches . Over the next 45 days, patient will work with provider to address needs related to headaches . Over the next 45 days, patient will attend all scheduled medical appointments:  Interventions:  . Inter-disciplinary care team collaboration (see longitudinal plan of care) . Advised patient to keep headache diary  . Reviewed medications with patient. . Discussed plans with patient for ongoing care management follow up and provided patient with direct contact information for care management team . Reviewed scheduled/upcoming provider appointment.  Plan:  . Patient will attend scheduled appointments. Marland Kitchen RNCM will follow up with patient within 45 days.   Initial goal documentation        Patient verbalizes understanding of instructions provided today.   The Managed Medicaid care management team will reach out to the patient again over the next 30 days.   Kathi Der RN, BSN   [facility], production - Managed Medicaid High Risk [phone]

## 2019-12-24 ENCOUNTER — Ambulatory Visit: Payer: Medicaid Other | Admitting: Diagnostic Neuroimaging

## 2020-01-07 ENCOUNTER — Other Ambulatory Visit: Payer: Self-pay

## 2020-01-07 ENCOUNTER — Other Ambulatory Visit: Payer: Self-pay | Admitting: Obstetrics and Gynecology

## 2020-01-07 NOTE — Patient Outreach (Signed)
Care Coordination  01/07/2020  Zarina Schwager May 05, 1995 [id number]   RNCM called patient at scheduled time.  Patient stated she was trying to sleep and asked that I call another time.  RNCM rescheduled appointment.  Kathi Der RN, BSN [place]  [facility] Engineer, production - Managed Medicaid High Risk [phone].

## 2020-01-08 ENCOUNTER — Other Ambulatory Visit: Payer: Self-pay

## 2020-01-08 ENCOUNTER — Other Ambulatory Visit: Payer: Self-pay | Admitting: Obstetrics and Gynecology

## 2020-01-08 NOTE — Patient Instructions (Signed)
Hi Ms. Kearse, thank you for speaking with me today.  Ms. Laramee was given information about Medicaid Managed Care team care coordination services as a part of their Healthy [facility] Medicaid benefit. Minola Risse verbally consented to engagement with the [facility] Managed Care team.   For questions related to your Healthy [facility] health plan, please call: [phone] or visit the homepage here: [email or web address]  If you would like to schedule transportation through your Healthy [facility] plan, please call the following number at least 2 days in advance of your appointment: [phone]  Goals Addressed            This Visit's Progress   . Make and Keep All Appointments       CARE PLAN ENTRY Medicaid Managed Care (see longitudinal plan of care for additional care plan information)  Current Barriers:  Marland Kitchen Knowledge Deficits related to treatment of headaches.  Nurse Case Manager Clinical Goal(s):  Marland Kitchen Over the next 45 days, patient will verbalize understanding of plan for headaches . Over the next 45 days, patient will work with provider to address needs related to headaches . Over the next 45 days, patient will attend all scheduled medical appointments:  Interventions:  . Inter-disciplinary care team collaboration (see longitudinal plan of care) . Advised patient to keep headache diary  . Reviewed medications with patient. . Discussed plans with patient for ongoing care management follow up and provided patient with direct contact information for care management team . Reviewed scheduled/upcoming provider appointment. Marland Kitchen Update 01/08/20:  Patient has appointment 01/12/20.  Plan:  . Patient will attend scheduled appointments. Marland Kitchen RNCM will follow up with patient within 45 days.   Please see past updates related to this goal by clicking on the "Past Updates" button in the selected goal       Patient verbalizes understanding of instructions  provided today.   The Managed Medicaid care management team will reach out to the patient again over the next 14 days.  The patient has been provided with contact information for the Managed Medicaid care management team and has been advised to call with any health related questions or concerns.   Kathi Der RN, BSN [place]  [facility] Engineer, production - Managed Medicaid High Risk [phone].

## 2020-01-12 ENCOUNTER — Other Ambulatory Visit: Payer: Self-pay

## 2020-01-12 ENCOUNTER — Ambulatory Visit: Payer: Medicaid Other | Admitting: Diagnostic Neuroimaging

## 2020-01-12 ENCOUNTER — Telehealth: Payer: Self-pay | Admitting: Diagnostic Neuroimaging

## 2020-01-12 ENCOUNTER — Encounter (HOSPITAL_COMMUNITY): Payer: Self-pay

## 2020-01-12 ENCOUNTER — Emergency Department (HOSPITAL_COMMUNITY)
Admission: EM | Admit: 2020-01-12 | Discharge: 2020-01-13 | Disposition: A | Discharge status: Home / Self Care | Payer: Medicaid Other | Attending: Emergency Medicine | Admitting: Emergency Medicine

## 2020-01-12 ENCOUNTER — Encounter: Payer: Self-pay | Admitting: Diagnostic Neuroimaging

## 2020-01-12 VITALS — BP 121/78 | HR 90 | Ht 60.0 in | Wt 246.0 lb

## 2020-01-12 DIAGNOSIS — Z5321 Procedure and treatment not carried out due to patient leaving prior to being seen by health care provider: Secondary | ICD-10-CM | POA: Insufficient documentation

## 2020-01-12 DIAGNOSIS — G40309 Generalized idiopathic epilepsy and epileptic syndromes, not intractable, without status epilepticus: Secondary | ICD-10-CM | POA: Diagnosis not present

## 2020-01-12 DIAGNOSIS — O219 Vomiting of pregnancy, unspecified: Secondary | ICD-10-CM | POA: Diagnosis not present

## 2020-01-12 DIAGNOSIS — O99351 Diseases of the nervous system complicating pregnancy, first trimester: Secondary | ICD-10-CM

## 2020-01-12 DIAGNOSIS — O26891 Other specified pregnancy related conditions, first trimester: Secondary | ICD-10-CM | POA: Diagnosis not present

## 2020-01-12 DIAGNOSIS — O26811 Pregnancy related exhaustion and fatigue, first trimester: Secondary | ICD-10-CM | POA: Diagnosis not present

## 2020-01-12 DIAGNOSIS — G40909 Epilepsy, unspecified, not intractable, without status epilepticus: Secondary | ICD-10-CM | POA: Diagnosis not present

## 2020-01-12 DIAGNOSIS — Z3A01 Less than 8 weeks gestation of pregnancy: Secondary | ICD-10-CM | POA: Diagnosis not present

## 2020-01-12 LAB — COMPREHENSIVE METABOLIC PANEL
ALT: 20 U/L (ref 0–44)
AST: 32 U/L (ref 15–41)
Albumin: 3.4 g/dL — ABNORMAL LOW (ref 3.5–5.0)
Alkaline Phosphatase: 56 U/L (ref 38–126)
Anion gap: 7 (ref 5–15)
BUN: 6 mg/dL (ref 6–20)
CO2: 22 mmol/L (ref 22–32)
Calcium: 8.7 mg/dL — ABNORMAL LOW (ref 8.9–10.3)
Chloride: 105 mmol/L (ref 98–111)
Creatinine, Ser: 0.72 mg/dL (ref 0.44–1.00)
GFR, Estimated: >60 mL/min (ref >60)
Glucose, Bld: 135 mg/dL — ABNORMAL HIGH (ref 70–99)
Potassium: 3.7 mmol/L (ref 3.5–5.1)
Sodium: 134 mmol/L — ABNORMAL LOW (ref 135–145)
Total Bilirubin: 0.5 mg/dL (ref 0.3–1.2)
Total Protein: 6.5 g/dL (ref 6.5–8.1)

## 2020-01-12 LAB — CBC
HCT: 36.3 % (ref 36.0–46.0)
Hemoglobin: 11.6 g/dL — ABNORMAL LOW (ref 12.0–15.0)
MCH: 27 pg (ref 26.0–34.0)
MCHC: 32 g/dL (ref 30.0–36.0)
MCV: 84.6 fL (ref 80.0–100.0)
Platelets: 338 10*3/uL (ref 150–400)
RBC: 4.29 MIL/uL (ref 3.87–5.11)
RDW: 13.3 % (ref 11.5–15.5)
WBC: 8.5 10*3/uL (ref 4.0–10.5)
nRBC: 0 % (ref 0.0–0.2)

## 2020-01-12 LAB — CBG MONITORING, ED: Glucose-Capillary: 141 mg/dL — ABNORMAL HIGH (ref 70–99)

## 2020-01-12 LAB — I-STAT BETA HCG BLOOD, ED (MC, WL, AP ONLY): I-stat hCG, quantitative: >2000 m[IU]/mL — ABNORMAL HIGH (ref <5)

## 2020-01-12 LAB — LIPASE, BLOOD: Lipase: 28 U/L (ref 11–51)

## 2020-01-12 MED ORDER — TOPIRAMATE 50 MG PO TABS: 50.0000 mg | ORAL_TABLET | Freq: Two times a day (BID) | ORAL | 12 refills | Status: DC

## 2020-01-12 NOTE — Patient Instructions (Addendum)
  PRIMARY GENERALIZED EPILEPSY  - reduce topiramate to 50mg  twice a day; consider switch to lamotrigine  - start folic acid 4mg  daily + prenatal vitamin  - follow up with ob/gyn  - will refer back to Dr. (epileptologist) for further evaluation

## 2020-01-12 NOTE — Telephone Encounter (Signed)
Sending to [facility] AT [place]ann Graves [phone].  I have called Patient and relayed Details to please be listing out for there phone call  [address] [place] [facility] [address] . [place] . [address] . Telephone [phone] . Thanks Annabelle Harman

## 2020-01-12 NOTE — ED Triage Notes (Signed)
Pt reports N/V, abdominal cramping, and weakness x2 weeks. Pt reports she has been unable to keep anything down in 2 weeks. Pt states she took a pregnancy test last night and it was positive. Pt cannot recall LMP, but reports having irregular periods.

## 2020-01-12 NOTE — Progress Notes (Addendum)
[facility]  PATIENT: Deborah Singleton DOB: 06/02/1995  REFERRING CLINICIAN: No ref. provider found P Messick HISTORY FROM: patient  REASON FOR VISIT: new consult    HISTORICAL  CHIEF COMPLAINT:  Chief Complaint  Patient presents with  . Seizures    Rm 7 hospital FU, "just found out I'm pregnant; seizures since 2015, was on Keppra- made me dizzy- switched to topamax-breakthrough seizures May/Aug"    HISTORY OF PRESENT ILLNESS:   24 year old female here for evaluation of seizures.  Patient has history of seizures since approximately age 69 years old, previously managed at [facility] and [facility].  Patient had abnormal EEG study.  She has been on levetiracetam, lamotrigine, and then topiramate.  She has continued on topiramate 75 mg twice a day.  Last night she took a pregnancy test which was positive.  She is not sure of her last menstrual period.  Patient presents for follow-up today.  Last breakthrough seizures were in May and August 2021.  Patient has 1 seizure every 2 to 3 months.  This is patient's third pregnancy.  Patient's first pregnancy was before her seizure disorder started.  During her second pregnancy she was on lamotrigine and delivered healthy child, premature, now 62 years old without any developmental issues.  Patient previously lives in [place].  Now she lives in [place].  She does not have a primary care physician.  She is planning to get established with OB/GYN for this pregnancy.   PRIOR HPI (Dr. Karel Jarvis, 04/24/18): "This is a 24 year old right-handed woman with a history of seizures since age 64 presenting to establish care. She was previously seeing epileptologist Dr. Sherlean Foot at [facility], records were reviewed and will be summarized below. Seizures started at age 89 soon after her son was born. She recalls feeling lightheaded and "going in and out" then had a seizure in the car. She reports body jerks where she would drop things. She had an  EEG at [facility] in 08/2014 with interictal epileptiform discharges consisting of polyspikes which were not clearly lateralizing, they are frequently seen on the left side but also occasionally on the right hemisphere. There were also polyspikes that were generalized. MRI brain with and without contrast in 01/2014 reported a single T2 hyperintensity in the left frontal region otherwise normal, MRA normal. She was previously on Levetiracetam and Lamotrigine, currently on Topiramate 50mg  BID. Her last visit at [facility] was in 2018, at that time she was started on Levetiracetam. She was in the ER in 09/2017 where she was prescribed the Topiramate, then lost her insurance and was back in the ER for a seizure on 02/17/2018 and Topiramate was restarted. She reports seizures every 1-2 months. She has bitten her tongue a few times, no post-ictal focal weakness. She would lose her memory after seizures. She lives with her children's father who has mentioned staring spells, but not that often. Seizure triggers are alcohol and "being around people who smoke and drink." She has headaches if she does not get a lot of sleep or moves around a lot. No olfactory/gustatory hallucinations, dizziness, diplopia, dysarthria/dysphagia, neck/back pain, focal numbness/tingling/weakness, bowel/bladder dysfunction. Sleep is good, sometimes she tosses and turns. She is not driving. She is currently unemployed. No pregnancy plans, she is sexually active and currently not on contraception. She felt the implant and Depo shots were causing weight gain and more seizures."  REVIEW OF SYSTEMS: Full 14 system review of systems performed and negative with exception of: As per HPI.  ALLERGIES: Allergies  Allergen Reactions  . Acetaminophen Itching  . Keppra [Levetiracetam] Other (See Comments)    "Dizzy, not myself"    HOME MEDICATIONS: Outpatient Medications Prior to Visit  Medication Sig Dispense Refill  . topiramate (TOPAMAX) 50 MG tablet Take 1.5  tablets twice a day (Patient taking differently: Take 75 mg by mouth 2 (two) times daily.) 90 tablet 11   No facility-administered medications prior to visit.    PAST MEDICAL HISTORY: Past Medical History:  Diagnosis Date  . Miscarriage 06/02/2019  . Seizures (HCC)    last seizure Aug 2021    PAST SURGICAL HISTORY: Past Surgical History:  Procedure Laterality Date  . TOOTH EXTRACTION      FAMILY HISTORY: Family History  Problem Relation Age of Onset  . Asthma Mother   . Seizures Mother   . Depression Sister   . Diabetes Sister        younger sister  . Diabetes Father     SOCIAL HISTORY: Social History   Socioeconomic History  . Marital status: Single    Spouse name: Not on file  . Number of children: 2  . Years of education: Not on file  . Highest education level: High school graduate  Occupational History  . Not on file  Tobacco Use  . Smoking status: Never Smoker  . Smokeless tobacco: Never Used  Vaping Use  . Vaping Use: Never used  Substance and Sexual Activity  . Alcohol use: Not Currently  . Drug use: No  . Sexual activity: Not Currently    Comment: Last intercourse in July  Other Topics Concern  . Not on file  Social History Narrative   In [place], lives with children   Social Determinants of Health   Financial Resource Strain: Not on file  Food Insecurity: Not on file  Transportation Needs: Not on file  Physical Activity: Not on file  Stress: Not on file  Social Connections: Not on file  Intimate Partner Violence: Not on file     PHYSICAL EXAM  GENERAL EXAM/CONSTITUTIONAL: Vitals:  Vitals:   01/12/20 1047  BP: 121/78  Pulse: 90  Weight: 246 lb (111.6 kg)  Height: 5' (1.524 m)     Body mass index is 48.04 kg/m. Wt Readings from Last 3 Encounters:  01/12/20 246 lb (111.6 kg)  06/05/19 229 lb (103.9 kg)  06/02/19 229 lb (103.9 kg)     Patient is in no distress; well developed, nourished and groomed; neck is  supple  CARDIOVASCULAR:  Examination of carotid arteries is normal; no carotid bruits  Regular rate and rhythm, no murmurs  Examination of peripheral vascular system by observation and palpation is normal  EYES:  Ophthalmoscopic exam of optic discs and posterior segments is normal; no papilledema or hemorrhages  No exam data present  MUSCULOSKELETAL:  Gait, strength, tone, movements noted in Neurologic exam below  NEUROLOGIC: MENTAL STATUS:  No flowsheet data found.  awake, alert, oriented to person, place and time  recent and remote memory intact  normal attention and concentration  language fluent, comprehension intact, naming intact  fund of knowledge appropriate  CRANIAL NERVE:   2nd - no papilledema on fundoscopic exam  2nd, 3rd, 4th, 6th - pupils equal and reactive to light, visual fields full to confrontation, extraocular muscles intact, no nystagmus  5th - facial sensation symmetric  7th - facial strength symmetric  8th - hearing intact  9th - palate elevates symmetrically, uvula midline  11th - shoulder shrug symmetric  12th - tongue protrusion midline  MOTOR:   normal bulk and tone, full strength in the BUE, BLE  SENSORY:   normal and symmetric to light touch, temperature, vibration  COORDINATION:   finger-nose-finger, fine finger movements normal  REFLEXES:   deep tendon reflexes present and symmetric  GAIT/STATION:   narrow based gait     DIAGNOSTIC DATA (LABS, IMAGING, TESTING) - I reviewed patient records, labs, notes, testing and imaging myself where available.  Lab Results  Component Value Date   WBC 8.0 09/26/2019   HGB 11.4 (L) 09/26/2019   HCT 36.0 09/26/2019   MCV 86.3 09/26/2019   PLT 327 09/26/2019      Component Value Date/Time   NA 136 09/26/2019 2004   K 3.8 09/26/2019 2004   CL 106 09/26/2019 2004   CO2 23 09/26/2019 2004   GLUCOSE 100 (H) 09/26/2019 2004   BUN 10 09/26/2019 2004   CREATININE 0.68  09/26/2019 2004   CALCIUM 8.6 (L) 09/26/2019 2004   PROT 7.8 06/02/2019 0849   ALBUMIN 4.1 06/02/2019 0849   AST 24 06/02/2019 0849   ALT 22 06/02/2019 0849   ALKPHOS 85 06/02/2019 0849   BILITOT 0.7 06/02/2019 0849   GFRNONAA >60 09/26/2019 2004   GFRAA >60 09/26/2019 2004   No results found for: CHOL, HDL, LDLCALC, LDLDIRECT, TRIG, CHOLHDL No results found for: GBEE1E No results found for: VITAMINB12 No results found for: TSH     ASSESSMENT AND PLAN  24 y.o. year old female here with history of primary generalized epilepsy since age 26 years old, now with positive pregnancy test on 01/11/2020.  Dx:  1. Generalized idiopathic epilepsy and epileptic syndromes, not intractable, without status epilepticus (HCC)   2. Seizure disorder during pregnancy in first trimester [facility])      PLAN:  PRIMARY GENERALIZED EPILEPSY (now pregnant; found out on 01/11/20) - reduce topiramate to 50mg  twice a day; consider switch to lamotrigine - start folic acid 4mg  daily + prenatal vitamin - follow up with ob/gyn - will refer back to Dr. (epileptologist) for further evaluation who last saw patient in March 2020  Orders Placed This Encounter  Procedures  . Ambulatory referral to Obstetrics / Gynecology   Meds ordered this encounter  Medications  . topiramate (TOPAMAX) 50 MG tablet    Sig: Take 1 tablet (50 mg total) by mouth 2 (two) times daily.    Dispense:  60 tablet    Refill:  12   Return pending referral back to Dr. Karel Jarvis, for pending if symptoms worsen or fail to improve.    April 2020, MD 01/12/2020, 11:06 AM Certified in Neurology, Neurophysiology and Neuroimaging  [facility] [address], [address] [place], [address] [place] [phone]

## 2020-01-13 ENCOUNTER — Encounter: Payer: Self-pay | Admitting: Diagnostic Neuroimaging

## 2020-01-13 NOTE — ED Notes (Signed)
Pt called 3X. Eloped from waiting area.

## 2020-01-14 ENCOUNTER — Encounter: Payer: Self-pay | Admitting: Neurology

## 2020-01-14 ENCOUNTER — Ambulatory Visit (INDEPENDENT_AMBULATORY_CARE_PROVIDER_SITE_OTHER): Payer: Medicaid Other | Admitting: Neurology

## 2020-01-14 ENCOUNTER — Other Ambulatory Visit: Payer: Self-pay

## 2020-01-14 ENCOUNTER — Other Ambulatory Visit: Payer: Self-pay | Admitting: Obstetrics and Gynecology

## 2020-01-14 VITALS — BP 111/74 | HR 89 | Resp 20 | Ht 62.0 in | Wt 245.0 lb

## 2020-01-14 DIAGNOSIS — G40309 Generalized idiopathic epilepsy and epileptic syndromes, not intractable, without status epilepticus: Secondary | ICD-10-CM | POA: Diagnosis not present

## 2020-01-14 MED ORDER — FOLIC ACID 1 MG PO TABS: ORAL_TABLET | ORAL | 11 refills | Status: DC

## 2020-01-14 MED ORDER — LEVETIRACETAM 500 MG PO TABS: ORAL_TABLET | ORAL | 11 refills | Status: DC

## 2020-01-14 MED ORDER — VITAMIN B-6 25 MG PO TABS: ORAL_TABLET | ORAL | 6 refills | Status: DC

## 2020-01-14 NOTE — Patient Instructions (Signed)
1. Start Keppra (Levetiracetam 500mg ): take 1/2 tablet twice a day for 3 days, then increase to 1 tablet twice a day  2. Start daily folic acid 1mg : Take 4 tablets daily (4mg  daily)  3. Try vitamin B6 25mg  every 8 hours to help with nausea in pregnancy  4. Start a daily prenatal vitamin  5. Follow-up with OB as planned  6. Follow-up in 1 month, call for any changes

## 2020-01-14 NOTE — Patient Instructions (Signed)
Hi Ms. Fullam, sorry I missed you today  - as a part of your Medicaid benefit, you are eligible for care management and care coordination services at no cost or copay. I was unable to reach you by phone today but would be happy to help you with your health related needs. Please feel free to call me at [phone].  A member of the Managed Medicaid care management team will reach out to you again over the next 7-14 days.   Kathi Der RN, BSN [place]   [employer] - Managed Medicaid High Risk [phone].

## 2020-01-14 NOTE — Progress Notes (Signed)
NEUROLOGY FOLLOW UP OFFICE NOTE  Deborah Singleton [id number] July 17, 1995  HISTORY OF PRESENT ILLNESS: I had the pleasure of seeing Deborah Singleton in follow-up in the neurology clinic on 01/14/2020.  The patient was last seen in March 2020 for primary generalized epilepsy. She was seen by neurologist Dr. Marjory Lies 2 days ago, she reported having a positive pregnancy test. She was instructed to reduce Topiramate to 50mg  BID, however today she reports that she has not been taking the Topiramate for the past 2 weeks because of nausea, she would throw up when she took medication so she stopped it. She reports 2 seizures this year, she had 3 seizures in one day last May (attributing it to being hot in the house), and a slight one in August that she does not remember, her mother found her on the bed. She denies any staring episodes but notices that when she is about to have a seizure, the inside of her body changes, she cries and lays down and things go black. She denies any other triggers, denies missing doses prior to the seizure, no sleep deprivation. She started having nausea 2 weeks ago and took a pregnancy test on 12/12. She thinks her LMP was in October. She has been drinking ginger ale for the nausea. She recalls taking Lamotrigine during one of her pregnancies. She reports dizziness on Levetiracetam but does not recall the dose.  She went to the ER on 01/12/20 and had an I-stat hCG >2000 then left before being seen.    History on Initial Assessment 04/24/2018: This is a 24 year old right-handed woman with a history of seizures since age 24 presenting to establish care. She was previously seeing epileptologist Dr. 12 at [facility], records were reviewed and will be summarized below. Seizures started at age 24 soon after her son was born. She recalls feeling lightheaded and "going in and out" then had a seizure in the car. She reports body jerks where she would drop things. She had an EEG at [facility] in  08/2014 with interictal epileptiform discharges consisting of polyspikes which were not clearly lateralizing, they are frequently seen on the left side but also occasionally on the right hemisphere. There were also polyspikes that were generalized. MRI brain with and without contrast in 01/2014 reported a single T2 hyperintensity in the left frontal region otherwise normal, MRA normal. She was previously on Levetiracetam and Lamotrigine, currently on Topiramate 50mg  BID. Her last visit at [facility] was in 2018, at that time she was started on Levetiracetam. She was in the ER in 09/2017 where she was prescribed the Topiramate, then lost her insurance and was back in the ER for a seizure on 02/17/2018 and Topiramate was restarted. She reports seizures every 1-2 months. She has bitten her tongue a few times, no post-ictal focal weakness. She would lose her memory after seizures. She lives with her children's father who has mentioned staring spells, but not that often. Seizure triggers are alcohol and "being around people who smoke and drink." She has headaches if she does not get a lot of sleep or moves around a lot. No olfactory/gustatory hallucinations, dizziness, diplopia, dysarthria/dysphagia, neck/back pain, focal numbness/tingling/weakness, bowel/bladder dysfunction. Sleep is good, sometimes she tosses and turns. She is not driving. She is currently unemployed. No pregnancy plans, she is sexually active and currently not on contraception. She felt the implant and Depo shots were causing weight gain and more seizures.   PAST MEDICAL HISTORY: Past Medical History:  Diagnosis Date  .  Miscarriage 06/02/2019  . Seizures (HCC)    last seizure Aug 2021    MEDICATIONS: Current Outpatient Medications on File Prior to Visit  Medication Sig Dispense Refill  . topiramate (TOPAMAX) 50 MG tablet Take 1 tablet (50 mg total) by mouth 2 (two) times daily. (Patient not taking: Reported on 01/14/2020) 60 tablet 12   No  current facility-administered medications on file prior to visit.    ALLERGIES: Allergies  Allergen Reactions  . Acetaminophen Itching  . Keppra [Levetiracetam] Other (See Comments)    "Dizzy, not myself"    FAMILY HISTORY: Family History  Problem Relation Age of Onset  . Asthma Mother   . Seizures Mother   . Depression Sister   . Diabetes Sister        younger sister  . Diabetes Father     SOCIAL HISTORY: Social History   Socioeconomic History  . Marital status: Single    Spouse name: Not on file  . Number of children: 2  . Years of education: Not on file  . Highest education level: High school graduate  Occupational History  . Not on file  Tobacco Use  . Smoking status: Never Smoker  . Smokeless tobacco: Never Used  Vaping Use  . Vaping Use: Never used  Substance and Sexual Activity  . Alcohol use: Not Currently  . Drug use: No  . Sexual activity: Not Currently    Comment: Last intercourse in July  Other Topics Concern  . Not on file  Social History Narrative   In [place], lives with children   Social Determinants of Health   Financial Resource Strain: Not on file  Food Insecurity: Not on file  Transportation Needs: Not on file  Physical Activity: Not on file  Stress: Not on file  Social Connections: Not on file  Intimate Partner Violence: Not on file     PHYSICAL EXAM: Vitals:   01/14/20 1438  BP: 111/74  Pulse: 89  Resp: 20  SpO2: 98%   General: No acute distress Head:  Normocephalic/atraumatic Skin/Extremities: No rash, no edema Neurological Exam: alert and oriented to person, place, and time. No aphasia or dysarthria. Fund of knowledge is appropriate.  Recent and remote memory are intact.  Attention and concentration are normal.   Cranial nerves: Pupils equal, round. Extraocular movements intact with no nystagmus. Visual fields full.  No facial asymmetry.  Motor: Bulk and tone normal, muscle strength 5/5 throughout with no pronator drift.    Finger to nose testing intact.  Gait narrow-based and steady, no ataxia.   IMPRESSION:  This is a 24 yo RH woman with a history of seizures since age 67 suggestive of primary generalized epilepsy. 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 Last seizure was in August 2021. She was on Topiramate 75mg  BID but reports stopping medication 2 weeks ago due to nausea. She had a positive pregnancy test and feels LMP was in October. We discussed how Topiramate is considered a Category D medication in pregnancy with risk of oral clefts during the first trimester. Since she has already stopped it, we discussed options for AEDs in pregnancy, and agreed to start on a lower dose of Levetiracetam and assess tolerability. She is agreeable to start Levetiracetam 500mg  1/2 tab BID for 3 days, then increase to 1 tab BID. We will start doing Keppra level every month  during her pregnancy, will likely need higher doses, monitor for side effects. She will try vitamin B6 for pregnancy-associated nausea. She was advised to start the folic acid and prenatal vitamins previously recommended and follow-up with OB. She is aware of [place] driving laws to stop driving until 6 months seizure-free. Follow-up in a month, she knows to call for any changes.    Thank you for allowing me to participate in her care.  Please do not hesitate to call for any questions or concerns.   Patrcia Dolly, M.D.   CC: Dr. Marjory Lies

## 2020-01-14 NOTE — Patient Outreach (Signed)
Care Coordination  01/14/2020  [name] 1995/07/29 [id number]    Medicaid Managed Care   Unsuccessful Outreach Note  01/14/2020 Name: Deborah Singleton MRN: [id number] DOB: 06-Feb-1995  Referred by: Patient, No Pcp Per Reason for referral : High Risk Managed Medicaid (Unsuccessful telephone outreach)   An unsuccessful telephone outreach was attempted today. The patient was referred to the case management team for assistance with care management and care coordination.   Follow Up Plan: RNCM will follow up with patient within 7-14 days.  Kathi Der RN, BSN [place]  [employer] - Managed Medicaid High Risk [phone].

## 2020-01-16 ENCOUNTER — Other Ambulatory Visit: Payer: Self-pay

## 2020-01-16 ENCOUNTER — Ambulatory Visit
Admission: EM | Admit: 2020-01-16 | Discharge: 2020-01-16 | Disposition: A | Discharge status: Home / Self Care | Payer: Medicaid Other | Attending: Urgent Care | Admitting: Urgent Care

## 2020-01-16 DIAGNOSIS — Z3201 Encounter for pregnancy test, result positive: Secondary | ICD-10-CM

## 2020-01-16 DIAGNOSIS — R112 Nausea with vomiting, unspecified: Secondary | ICD-10-CM

## 2020-01-16 LAB — POCT URINALYSIS DIP (MANUAL ENTRY)
Bilirubin, UA: NEGATIVE
Blood, UA: NEGATIVE
Glucose, UA: NEGATIVE mg/dL
Ketones, POC UA: NEGATIVE mg/dL
Nitrite, UA: NEGATIVE
Spec Grav, UA: 1.025 (ref 1.010–1.025)
Urobilinogen, UA: 1 E.U./dL
pH, UA: 7 (ref 5.0–8.0)

## 2020-01-16 LAB — POCT URINE PREGNANCY: Preg Test, Ur: POSITIVE — AB

## 2020-01-16 MED ORDER — VITAMIN B-6 25 MG PO TABS: 25.0000 mg | ORAL_TABLET | Freq: Three times a day (TID) | ORAL | 0 refills | Status: DC | PRN

## 2020-01-16 NOTE — ED Triage Notes (Signed)
Pt c/o lower abdominal pain nausea and vomiting x2 in the past 2 days. States can't keep anything down.

## 2020-01-16 NOTE — ED Provider Notes (Signed)
[facility]   MRN: [id number] DOB: 01/01/1996  Subjective:   Deborah Singleton is a 24 y.o. female presenting for 2-day history of acute onset nausea with vomiting, intermittent lower abdominal pain that is mild to moderate in nature.  Denies any active abdominal or pelvic pain.  Denies fever, vaginal discharge, vaginal bleeding, dysuria, urinary frequency.  This is not the patient's first pregnancy.  She is sexually active but does not want to have STI testing.  Has not tried medications for relief.  Patient is new to the area and needs recommendation for women's health.  No current facility-administered medications for this encounter.  Current Outpatient Medications:    folic acid (FOLVITE) 1 MG tablet, Take 4 tablets (4mg ) daily, Disp: 120 tablet, Rfl: 11   levETIRAcetam (KEPPRA) 500 MG tablet, Take 1/2 tablet twice a day for 3 days, then increase to 1 tablet twice a day, Disp: 60 tablet, Rfl: 11   topiramate (TOPAMAX) 50 MG tablet, Take 1 tablet (50 mg total) by mouth 2 (two) times daily. (Patient not taking: Reported on 01/14/2020), Disp: 60 tablet, Rfl: 12   vitamin B-6 (PYRIDOXINE) 25 MG tablet, Take 1 tablet every 8 hours as needed for nausea in pregnancy, Disp: 30 tablet, Rfl: 6   Allergies  Allergen Reactions   Acetaminophen Itching   Keppra [Levetiracetam] Other (See Comments)    "Dizzy, not myself"    Past Medical History:  Diagnosis Date   Miscarriage 06/02/2019   Seizures (HCC)    last seizure Aug 2021     Past Surgical History:  Procedure Laterality Date   TOOTH EXTRACTION      Family History  Problem Relation Age of Onset   Asthma Mother    Seizures Mother    Depression Sister    Diabetes Sister        younger sister   Diabetes Father     Social History   Tobacco Use   Smoking status: Never Smoker   Smokeless tobacco: Never Used  Vaping Use   Vaping Use: Never used  Substance Use Topics   Alcohol use: Not Currently    Drug use: No    ROS   Objective:   Vitals: BP 111/72 (BP Location: Left Arm)    Pulse 82    Temp 98 F (36.7 C) (Oral)    Resp 20    LMP  (LMP Unknown)    SpO2 97%    Breastfeeding No   Physical Exam Constitutional:      General: She is not in acute distress.    Appearance: Normal appearance. She is well-developed. She is obese. She is not ill-appearing, toxic-appearing or diaphoretic.  HENT:     Head: Normocephalic and atraumatic.     Nose: Nose normal.     Mouth/Throat:     Mouth: Mucous membranes are moist.     Pharynx: Oropharynx is clear.  Eyes:     General: No scleral icterus.    Extraocular Movements: Extraocular movements intact.     Pupils: Pupils are equal, round, and reactive to light.  Cardiovascular:     Rate and Rhythm: Normal rate.  Pulmonary:     Effort: Pulmonary effort is normal.  Abdominal:     General: Bowel sounds are normal. There is no distension.     Palpations: Abdomen is soft.     Tenderness: There is no abdominal tenderness. There is no right CVA tenderness, left CVA tenderness, guarding or rebound.  Skin:    General:  Skin is warm and dry.  Neurological:     General: No focal deficit present.     Mental Status: She is alert and oriented to person, place, and time.  Psychiatric:        Mood and Affect: Mood normal.        Behavior: Behavior normal.     Results for orders placed or performed during the hospital encounter of 01/16/20 (from the past 24 hour(s))  POCT urine pregnancy     Status: Abnormal   Collection Time: 01/16/20  6:39 PM  Result Value Ref Range   Preg Test, Ur Positive (A) Negative  POCT urinalysis dipstick     Status: Abnormal   Collection Time: 01/16/20  6:39 PM  Result Value Ref Range   Color, UA yellow yellow   Clarity, UA cloudy (A) clear   Glucose, UA negative negative mg/dL   Bilirubin, UA negative negative   Ketones, POC UA negative negative mg/dL   Spec Grav, UA 1.610 9.604 - 1.025   Blood, UA negative  negative   pH, UA 7.0 5.0 - 8.0   Protein Ur, POC trace (A) negative mg/dL   Urobilinogen, UA 1.0 0.2 or 1.0 E.U./dL   Nitrite, UA Negative Negative   Leukocytes, UA Trace (A) Negative    Assessment and Plan :   PDMP not reviewed this encounter.  1. Positive pregnancy test   2. Nausea and vomiting, intractability of vomiting not specified, unspecified vomiting type     General counseling provided including avoidance of alcohol and cigarettes, caffeine.  Counseled on medications generally safe in pregnancy.  Use pyridoxine for nausea and vomiting in pregnancy.  Patient declined STI testing.  Recommended she establish care with women's health care center. Counseled patient on potential for adverse effects with medications prescribed/recommended today, ER and return-to-clinic precautions discussed, patient verbalized understanding.    Wallis Bamberg, [place] 01/16/20 1851

## 2020-01-16 NOTE — Discharge Instructions (Addendum)
Do not use any nonsteroidal anti-inflammatories (NSAIDs) like ibuprofen, Motrin, naproxen, Aleve, etc. which are all available over-the-counter.  Please just use Tylenol at a dose of 500mg-650mg once every 6 hours as needed for your aches, pains, fevers. 

## 2020-01-31 NOTE — L&D Delivery Note (Signed)
Delivery Note Deborah Singleton is a 25 y.o. [id number] at [redacted]w[redacted]d admitted for IOL for gHTN.   GBS Status:  Negative/-- (07/07 1120) Maximum Maternal Temperature: 98.7  Labor course: Initial SVE: 1.5/70/-3. Augmentation with: AROM, Pitocin, Cytotec, and IP Foley. Called to patient room for FHR in the 90's. Patient found to be complete. FSE placed given difficulty in tracing FHT's. FHR down to 50's-60's during pushing so Dr. Despina Hidden was called to bedside for delivery.  ROM: 2h 78m with clear fluid  Birth: At 2106 a viable female was delivered via spontaneous vaginal delivery (Presentation:cephalic; LOA). Nuchal cord present x1. Baby somersaulted and delivered through without difficulty. Shoulders and body delivered in usual fashion. Infant placed directly on mom's abdomen for bonding/skin-to-skin, baby dried and stimulated. Cord clamped x 2 after 1 minute and cut by FOB. Cord blood collected.  The placenta separated spontaneously and delivered via gentle cord traction. Pitocin infused rapidly IV per protocol. Fundus firm with massage.  Placenta inspected and appears to be intact with a 3 VC.  Placenta/Cord with the following complications: none. Cord pH: n/a Sponge and instrument count were correct x2.  Intrapartum complications:  gHTN Anesthesia:  epidural Episiotomy: none Lacerations:  none Suture Repair:  n/a EBL (mL): approximately   Infant: APGAR (1 MIN): 7   APGAR (5 MINS): 9  Infant weight: pending  Mom to postpartum.  Baby to Couplet care / Skin to Skin. Placenta to L&D   Plans to Breast and bottlefeed Contraception: tubal ligation Circumcision: unsure  Note sent to [facility]: [facility] for pp visit.  Brand Males, MSN, CNM 08/20/2020 9:19 PM

## 2020-02-03 ENCOUNTER — Other Ambulatory Visit: Payer: Self-pay

## 2020-02-03 ENCOUNTER — Other Ambulatory Visit: Payer: Self-pay | Admitting: Obstetrics and Gynecology

## 2020-02-03 NOTE — Patient Outreach (Signed)
Care Coordination  02/03/2020  Deborah Singleton November 18, 1995 [id number]   RNCM called patient at scheduled time.  Patient's sister answered the phone and stated patient not available at this time.  RNCM will reschedule appointment.  Kathi Der RN, BSN   [employer] - Managed Medicaid High Risk [phone]

## 2020-02-10 ENCOUNTER — Other Ambulatory Visit: Payer: Self-pay | Admitting: Obstetrics and Gynecology

## 2020-02-10 NOTE — Patient Outreach (Signed)
Care Coordination  02/10/2020  Deborah Singleton 05/17/1995 [id number]   RNCM called patient at scheduled time.  Patient requested that I call her back Friday morning.  RNCM will reschedule appointment to Friday morning at patient request.  Kathi Der RN, BSN [place]  [facility] Care Management Coordinator - Managed [facility] High Risk [phone].

## 2020-02-12 ENCOUNTER — Other Ambulatory Visit: Payer: Self-pay

## 2020-02-12 ENCOUNTER — Telehealth: Payer: Medicaid Other

## 2020-02-13 ENCOUNTER — Other Ambulatory Visit: Payer: Self-pay | Admitting: Obstetrics and Gynecology

## 2020-02-13 NOTE — Patient Outreach (Signed)
Care Coordination  02/13/2020  Deborah Singleton 1995-08-14 [id number]    Medicaid Managed Care   Unsuccessful Outreach Note  02/13/2020 Name: Deborah Singleton MRN: [id number] DOB: Nov 03, 1995  Referred by: Patient, No Pcp Per Reason for referral : High Risk Managed Medicaid (Unsuccessful telephone outreach)   An unsuccessful telephone outreach was attempted today. The patient was referred to the case management team for assistance with care management and care coordination.   Follow Up Plan: The Managed Medicaid  care management team will reach out to the patient again over the next 7 days.   Kathi Der RN, BSN [place]  [employer] - Managed Medicaid High Risk [phone]

## 2020-02-13 NOTE — Patient Instructions (Signed)
Hi Ms. Lucien, sorry we missed you today - as a part of your Medicaid benefit, you are eligible for care management and care coordination services at no cost or copay. I was unable to reach you by phone today but would be happy to help you with your health related needs. Please feel free to call me at [phone].  A member of the Managed Medicaid care management team will reach out to you again over the next 7 days.   Kathi Der RN, BSN [place]  [employer] - Managed Medicaid High Risk [phone].

## 2020-02-19 ENCOUNTER — Other Ambulatory Visit: Payer: Self-pay

## 2020-02-19 ENCOUNTER — Encounter: Payer: Self-pay | Admitting: Neurology

## 2020-02-19 ENCOUNTER — Telehealth (INDEPENDENT_AMBULATORY_CARE_PROVIDER_SITE_OTHER): Payer: Medicaid Other | Admitting: Neurology

## 2020-02-19 DIAGNOSIS — O99351 Diseases of the nervous system complicating pregnancy, first trimester: Secondary | ICD-10-CM

## 2020-02-19 DIAGNOSIS — G40309 Generalized idiopathic epilepsy and epileptic syndromes, not intractable, without status epilepticus: Secondary | ICD-10-CM

## 2020-02-19 DIAGNOSIS — G40909 Epilepsy, unspecified, not intractable, without status epilepticus: Secondary | ICD-10-CM | POA: Diagnosis not present

## 2020-02-19 DIAGNOSIS — G43009 Migraine without aura, not intractable, without status migrainosus: Secondary | ICD-10-CM | POA: Diagnosis not present

## 2020-02-19 MED ORDER — MAGNESIUM OXIDE 400 (241.3 MG) MG PO TABS: 400.0000 mg | ORAL_TABLET | Freq: Every day | ORAL | 11 refills | Status: DC

## 2020-02-19 NOTE — Progress Notes (Signed)
Virtual Visit via Video Note The purpose of this virtual visit is to provide medical care while limiting exposure to the novel coronavirus.    Consent was obtained for video visit:  Yes.   Answered questions that patient had about telehealth interaction:  Yes.   I discussed the limitations, risks, security and privacy concerns of performing an evaluation and management service by telemedicine. I also discussed with the patient that there may be a patient responsible charge related to this service. The patient expressed understanding and agreed to proceed.  Pt location: Home Physician Location: office Name of referring provider:  No ref. provider found I connected with Deborah Singleton at patients initiation/request on 02/19/2020 at 11:30 AM EST by video enabled telemedicine application and verified that I am speaking with the correct person using two identifiers. Pt MRN:  [id number] Pt DOB:  10-Jun-1995 Video Participants:  Deborah Singleton   History of Present Illness:  The patient had a virtual video visit on 02/19/2020. She was last seen a month ago as she had found out she was pregnant. 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 She had stopped the Topiramate when she found out she was pregnant. She was started on Levetiracetam 500mg  BID last month, which she is tolerating better this time, no side effects. She denies any seizures since 08/2019. She denies any staring/unresponsive episodes, myoclonic jerks. Her main concern today is the increase in migraines this week. She had been doing fine up until earlier this week she started having migraines with pain on the right side. She took Tylenol which did not help, the next day pain was on the left side. She has been having nausea and vomiting with her pregnancy. She fell last night when her foot went numb, no injuries. She does not sleep well, uncomfortable, tossing and turning.    History on Initial Assessment 04/24/2018:  This is a 25 year old right-handed woman with a history of seizures since age 24 presenting to establish care. She was previously seeing epileptologist Dr. 12 at [facility], records were reviewed and will be summarized below. Seizures started at age 68 soon after her son was born. She recalls feeling lightheaded and "going in and out" then had a seizure in the car. She reports body jerks where she would drop things. She had an EEG at [facility] in 08/2014 with interictal epileptiform discharges consisting of polyspikes which were not clearly lateralizing, they are frequently seen on the left side but also occasionally on the right hemisphere. There were also polyspikes that were generalized. MRI brain with and without contrast in 01/2014 reported a single T2 hyperintensity in the left frontal region otherwise normal, MRA normal. She was previously on Levetiracetam and Lamotrigine, currently on Topiramate 50mg  BID. Her last visit at [facility] was in 2018, at that time she was started on Levetiracetam. She was in the ER in 09/2017 where she was prescribed the Topiramate, then lost her insurance and was back in the ER for a seizure on 02/17/2018 and Topiramate was restarted. She reports seizures every 1-2 months. She has bitten her tongue a few times, no post-ictal focal weakness. She would lose her memory after seizures. She lives with her children's father who has mentioned staring spells, but not that often. Seizure triggers are alcohol and "being around people who smoke and drink." She has headaches if she does not get a lot of sleep or moves around a lot. No olfactory/gustatory hallucinations, dizziness, diplopia, dysarthria/dysphagia, neck/back  pain, focal numbness/tingling/weakness, bowel/bladder dysfunction. Sleep is good, sometimes she tosses and turns. She is not driving. She is currently unemployed. No pregnancy plans, she is sexually active and currently not on contraception. She felt the implant and Depo shots  were causing weight gain and more seizures.     Current Outpatient Medications on File Prior to Visit  Medication Sig Dispense Refill  . folic acid (FOLVITE) 1 MG tablet Take 4 tablets (4mg ) daily 120 tablet 11  . levETIRAcetam (KEPPRA) 500 MG tablet Take 1/2 tablet twice a day for 3 days, then increase to 1 tablet twice a day 60 tablet 11  . vitamin B-6 (PYRIDOXINE) 25 MG tablet Take 1 tablet (25 mg total) by mouth 3 (three) times daily as needed. Take 1 tablet every 8 hours as needed for nausea in pregnancy 30 tablet 0   No current facility-administered medications on file prior to visit.     Observations/Objective:   GEN:  The patient appears stated age and is in NAD.  Neurological examination: Patient is awake, alert. No aphasia or dysarthria. Intact fluency and comprehension.Cranial nerves: Extraocular movements intact with no nystagmus. No facial asymmetry. Motor: moves all extremities symmetrically, at least anti-gravity x 4.   Assessment and Plan:   This is a 25 yo RH woman with a history of seizures since age 41 suggestive of primary generalized epilepsy. 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 Last seizure was in August 2021. She is in her first trimester of pregnancy, tolerating Levetiracetam 500mg  BID. We discussed checking Keppra levels every month during pregnancy, we will likely need to increase dose. She is reporting migraines this week and will try magnesium 400mg  daily for migraine prophylaxis. Unfortunately options are limited during pregnancy. Continue folic acid and prenatal vitamins. Follow-up with OB as scheduled. She is aware of [place] driving laws to stop driving after a seizure until 6 months seizure-free. Follow-up in 3 months, she knows to call for any changes.    Follow Up Instructions:   -I discussed the assessment and  treatment plan with the patient. The patient was provided an opportunity to ask questions and all were answered. The patient agreed with the plan and demonstrated an understanding of the instructions.   The patient was advised to call back or seek an in-person evaluation if the symptoms worsen or if the condition fails to improve as anticipated.    September 2021, MD

## 2020-02-26 ENCOUNTER — Other Ambulatory Visit: Payer: Self-pay

## 2020-02-26 ENCOUNTER — Ambulatory Visit (INDEPENDENT_AMBULATORY_CARE_PROVIDER_SITE_OTHER): Payer: Medicaid Other | Admitting: Certified Nurse Midwife

## 2020-02-26 ENCOUNTER — Encounter: Payer: Self-pay | Admitting: Certified Nurse Midwife

## 2020-02-26 ENCOUNTER — Other Ambulatory Visit (HOSPITAL_COMMUNITY)
Admission: RE | Admit: 2020-02-26 | Discharge: 2020-02-26 | Disposition: A | Discharge status: Home / Self Care | Payer: Medicaid Other | Source: Ambulatory Visit | Attending: Certified Nurse Midwife | Admitting: Certified Nurse Midwife

## 2020-02-26 VITALS — BP 119/76 | HR 96 | Wt 237.8 lb

## 2020-02-26 DIAGNOSIS — Z3480 Encounter for supervision of other normal pregnancy, unspecified trimester: Secondary | ICD-10-CM | POA: Diagnosis not present

## 2020-02-26 DIAGNOSIS — Z3A16 16 weeks gestation of pregnancy: Secondary | ICD-10-CM

## 2020-02-26 DIAGNOSIS — N760 Acute vaginitis: Secondary | ICD-10-CM

## 2020-02-26 DIAGNOSIS — O99210 Obesity complicating pregnancy, unspecified trimester: Secondary | ICD-10-CM

## 2020-02-26 DIAGNOSIS — G40909 Epilepsy, unspecified, not intractable, without status epilepticus: Secondary | ICD-10-CM

## 2020-02-26 DIAGNOSIS — Z3482 Encounter for supervision of other normal pregnancy, second trimester: Secondary | ICD-10-CM | POA: Diagnosis not present

## 2020-02-26 DIAGNOSIS — B9689 Other specified bacterial agents as the cause of diseases classified elsewhere: Secondary | ICD-10-CM

## 2020-02-26 DIAGNOSIS — O99350 Diseases of the nervous system complicating pregnancy, unspecified trimester: Secondary | ICD-10-CM

## 2020-02-26 MED ORDER — ASPIRIN EC 81 MG PO TBEC: 81.0000 mg | DELAYED_RELEASE_TABLET | Freq: Every day | ORAL | 0 refills | Status: DC

## 2020-02-26 MED ORDER — BLOOD PRESSURE MONITORING DEVI: 1.0000 | 0 refills | Status: DC

## 2020-02-26 NOTE — Patient Instructions (Signed)
AREA PEDIATRIC/FAMILY PRACTICE PHYSICIANS  [place] ([address]) . [facility] o Chambliss, MD; Eniola, MD; Hale, MD; Hensel, MD; McDiarmid, MD; McIntyer, MD; Kynzleigh Bandel, MD; Walden, MD o [address] [place], [place] [address] o [phone] o Mon-Fri 8:30-12:30, 1:30-5:00 o Providers come to see babies at [facility] o Accepting Medicaid . [facility] o Limited providers who accept newborns: Koirala, MD; Morrow, MD; Wolters, MD o [address], [place], [place] [address] o [phone] o Mon-Fri 8:00-5:30 o Babies seen by providers at [facility] o Does NOT accept Medicaid o Please call early in hospitalization for appointment (limited availability)  . [facility] o Mulberry, MD o [address] [place], [place] [address] o [phone] o Mon, Tue, Thur, Fri 8:30-5:00, Wed 10:00-7:00 (closed 1-2pm) o Babies seen by [facility] providers o Accepting Medicaid . [facility] - Pediatrician o Rubin, MD o [address], [place], [place] [address] o [phone] o Mon-Fri 8:30-5:00, Sat 8:30-12:00 o Provider comes to see babies at [facility] o Accepting Medicaid o Must have been referred from current patients or contacted office prior to delivery . [facility] ([facility]) o Brown, MD; Chandler, MD; Ettefagh, MD; Grant, MD; Lester, MD; McCormick, MD; McQueen, MD; Prose, MD; Simha, MD; Stanley, MD; Stryffeler, NP; Tebben, NP o [address], [place], [place] [address] o [phone] o Mon, Tue, Thur, Fri 8:30-5:30, Wed 9:30-5:30, Sat 8:30-12:30 o Babies seen by [facility] providers o Accepting Medicaid o Only accepting infants of first-time parents or siblings of current patients o Hospital discharge coordinator will make follow-up appointment . Jack Amos o [address],  [place], [place]  [address] o [phone]   Fax - [phone] . [facility] o [address], [place], [place]  [address] o Phone - [phone]   Fax - [phone] . Shilpa Gosrani o [address], [place], [place]  [address] o [phone]  [place] ([address]) . [facility] o Bates, MD; Brassfield, MD; Cooper, Cox, MD; MD; Davis, MD; Dovico, MD; Ettefaugh, MD; Little, MD; Lowe, MD; Keiffer, MD; Melvin, MD; Sumner, MD; Williams, MD o [address], [place], [place] [address] o [phone] o Mon-Fri 8:30-5:00 (extended evenings Mon-Thur as needed), Sat-Sun 10:00-1:00 o Providers come to see babies at [facility] o Accepting Medicaid for families of first-time babies and families with all children in the household age 3 and under. Must register with office prior to making appointment (M-F only). . [facility] o Henson, NP; Knapp, MD; Lalonde, MD; Tysinger, PA o [address] [place], [place] [address] o [phone] o Mon-Fri 8:00-5:00 o Babies seen by providers at [facility] o Does NOT accept Medicaid/Commercial Insurance Only . [facility] ([facility]  o Artis, MD; Barnes, MD; Bratton, MD; Coccaro, MD; Lockett Gardner, MD; Kramer, MD; Marshall, MD; Netherton, MD; Poleto, MD; Skinner, MD o [address] [place], [place] [address] o [phone] o Mon-Fri 8:30-5:30, Sat (Oct.-Mar.) 9:00-1:00 o Babies seen by providers at [facility] o Accepting Medicaid  [place] ([address]) . [facility]  o Reid, MD; Warner, MD o [address], , [place] [address] o [phone] o Mon-Fri 8:30-5:00, Sat 8:30-12:00 o Providers come to see babies at [facility] o Does NOT accept Medicaid . [facility] o Becker, PA; Hagler, MD; Scifres, PA; Sun, MD; Swayne, MD o [address],  [place], [place] [address] o [phone] o Mon-Fri 8:00-5:00 o Babies seen by providers at [facility] o Does NOT accept Medicaid o Only accepting babies of parents who are patients o Please call early in hospitalization for appointment (limited availability) . [facility] o Clark, MD; Frye, MD; Kelleher, MD; Mack, NP; Miller, MD; O'Keller, MD; Patterson, NP; Pudlo, MD; Puzio, MD; Thomas, MD; Tucker, MD; Twiselton, MD o [address], [place], [place] [address] o [phone] o Mon-Fri 8:00-5:00, Sat 9:00-12:00 o Providers come to see babies at [facility] o Does NOT accept Medicaid  [place] ([address]) . [facility] o Limited providers accepting new patients: Brake, NP; Wharton, PA o [address], [place], [place] [address] o [phone] o Mon-Fri 8:00-5:00 o Babies seen by providers at [facility] o Does NOT accept Medicaid o Only accepting babies of parents who are patients o Please call early in hospitalization for appointment (limited availability) . [facility] o Gay, MD; Quinlan, MD o [address] [place], [place] [address] o [phone] (press [phone] to schedule appointment) o Mon-Fri 8:00-5:00 o Providers come to see babies at [facility] o Does NOT accept Medicaid . [facility] o Mazer, MD o [address] [place], [place] [address] o [phone] o Mon-Fri 8:30-5:00 (lunch 12:30-1:00), extended hours by appointment only Wed 5:00-6:30 o Babies seen by [facility] providers o Accepting Medicaid . [facility] o Banks, MD; Jordan, MD; Koberlein, MD o [address], [place], [place] [address] o [phone] o Mon-Fri 8:00-5:00 o Babies seen by [facility] providers o Does NOT accept Medicaid . [facility] o Parker, MD; Hunter, MD; Wallace, DO o [address] [place], [place]  [address] o [phone] o Mon-Fri 8:00-5:00 o Babies seen by [facility] providers o Does NOT accept Medicaid . [facility] o Brandon, PA; Brecken, PA; Christy, NP; Dees, MD; DeClaire, MD; DeWeese, MD; Hansen, NP; Mills, NP; Parrish, NP; Smoot, NP; Summer, MD; Vapne, MD o [address] [place], [place] [address] o [phone] o Mon-Fri 8:30-5:00, Sat 10:00-1:00 o Providers come to see babies at [facility] o Does NOT accept Medicaid o Free prenatal information session Tuesdays at 4:45pm . [facility] o Bouska, MD; Gordon, PA; Jeffery, PA; Weber, PA o [address] [place] [address] o [phone] o Mon-Fri 7:30-5:30 o Babies seen by [facility] providers . [facility] o [address], [place], [place]  [address] o [phone]   Fax - [phone]  [place] ([address] & [address]) . [facility] o [place], [place] o [address] [place], [place] [address] o [phone] o Mon-Thur 8:00-6:00 o Providers come to see babies at [facility] o Accepting Medicaid . [facility] o Anderson, NP; Badger, MD; Beal, PA; Spencer, PA o [address] [place], [place] [address] o [phone] o Mon-Thur 7:30-7:30, Fri 7:30-4:30 o Babies seen by [facility] providers o Accepting Medicaid . [facility] o Agbuya, MD; Klett, NP; Romgoolam, MD o [address], [place], [place] [address] o [phone] o Mon-Fri 8:30-5:00, Sat 8:30-12:00 o Providers come to see babies at [facility] o Accepting Medicaid o Must have "Meet & Greet" appointment at office prior to delivery . [facility] o McCord,   MD; Wallace, MD; Wood, MD o [address], [place], [place] [address] o [phone] o Mon-Wed 8:00-6:00, Thur-Fri 8:00-5:00, Sat 9:00-12:00 o Providers come to  see babies at [facility] o Does NOT accept Medicaid o Only accepting siblings of current patients . [facility]  o [address], [place], [place]  [address] o [phone]   Fax - [phone] . [facility] o [address], [place], [place]  [address] o [phone]   Fax - [phone]  [place] ([address] & [address]) . [facility] o Cirigliano, DO; Matthews, DO o [address] [place], [place] [address] o [phone] o Mon-Fri 7:00-5:00 o Babies seen by [facility] providers o Does NOT accept Medicaid . [facility] o Briscoe, MD; Howley, PA; Moreira, PA o [address], [place], [place] [address] o [phone] o Mon-Fri 8:00-5:00 o Babies seen by [facility] providers o Accepting Medicaid . [facility] o Boyd, MD; Church, PA; Jones, NP; Osborn, PA o [address], [place], [place] [address] o [phone] o Mon-Fri 8:00-5:00 o Babies seen by providers at [facility] o Accepting Medicaid  [place] ([address]) . [facility] [place] o Wendling, DO o [address] [place], [place] [address] o [phone] o Mon-Fri 8:00-5:00 o Babies seen by [facility] providers o Does NOT accept Medicaid o Limited availability, please call early in hospitalization to schedule follow-up . [facility] o Calderon, PA; Cummings, MD; Dillard, MD; Martin, PA; Olson, MD; VanDeven, PA o [address], [place], [place] [address] o [phone] o Mon-Fri 8:30-5:00, Sat 9:00-12:00 o Babies seen by providers at [facility] o Accepting Medicaid o Please register online then schedule online or call office o [email or web address] . [facility] ([facility] o Hunter, NP; Kumar, MD; Martin Rogers, PA o [address], [place], [place] [address] o [phone] o Mon-Fri 8:00-5:00 o Babies seen by providers at [facility] o Accepting Medicaid . [facility] ([facility] o Wide Ruins, MD; Kristi Fleenor, NP; West, MD o [address], [place], [place] [address] o [phone] o Mon-Fri 8:00-5:30, Sat&Sun by appointment (phones open at 8:30) o Babies seen by [facility] providers o Accepting Medicaid o Must be a first-time baby or sibling of current patient . [facility] [place]  o [address], [place], [place]  [address] o [phone]   Fax - [phone]  [place] ([address]) . [facility] o Brown, PA; Cowen, PA; Rice, MD; Helton, PA; Spry, MD o [address] [place], [place] [address] o [phone] o Mon-Thur 8:00-7:00, Fri 8:00-5:00, Sat 8:00-12:00, Sun 9:00-12:00 o Babies seen by [facility] providers o Accepting Medicaid . [facility] o Coe-Goins, MD; Marshall, MD; [place]-Louis, MD o [address] [place] [address], [place], [place] [address] o [phone] o Mon-Thur 8:00-5:00 o Babies seen by providers at [facility] o Accepting Medicaid . [facility] o Bratton, MD; Coe-Goins, MD; Hayes, MD; Lewis, MD; List, MD; Lott, MD; Marshall, MD; Moran, MD; O'Osher Oettinger, MD; Pierre-Louis, MD; Pitonzo, MD; Scholer, MD; Spangle, MD o [address] [place],    [address] o [phone] o Mon-Fri 8:00-5:30, Sat (Oct.-Mar.) 9:00-1:00 o Babies seen by providers at [facility] o Accepting Medicaid o Must fill out new patient packet, available online at [email or web address] . [facility] ([facility]) o Friddle, NP; Harris, NP; Kelly, NP; Logan, MD;  Melvin, PA; Poth, MD; Ramadoss, MD; [place], NP o [address], [place], [place] [address] o [phone] o Mon-Thur 8:00-5:30, Fri 8:00-5:00 o Babies seen by providers at [facility] o Accepting Medicaid  [place] ([address]) . [facility] o Dixon, PA; Panola, MD; Pickard, MD; Tapia, PA o [address], [place], [place] [address] o [phone] o Mon-Fri 8:00-5:00 o Babies seen by providers at [facility] o Accepting Medicaid   [place] ([address]) . [facility] [place] o Masneri, DO; Meyers, MD; Nelson, PA o [address], [place], [place] [address] o [phone] o Mon-Fri 8:00-5:00 o Babies seen by providers at [facility] o Does NOT accept Medicaid o Limited appointment availability, please call early in hospitalization  . [facility] [place] o Kunedd, DO; McGowen, MD o [address], [place], [place] [address] o [phone] o Mon-Fri 8:00-5:00 o Babies seen by [facility] providers o Does NOT accept Medicaid . [facility] [place] o Cameron, MD; MacDonald, MD; Michaels, PA; Nayak, MD o [address] [place] [address], [place], [place] [address] o [phone] o Mon-Fri 8:00-5:00 o After hours clinic ([address] [place], [place] [address]) [phone] Mon-Fri 5:00-8:00, Sat 12:00-6:00, Sun 10:00-4:00 o Babies seen by [facility] providers o Accepting Medicaid . [facility] [place] o [address], [place], [place]  [address] o [phone]   Fax - [phone]  [place] ([address]) . [facility] [place] [facility] o Andy, MD o [address], [place],  [address] o [phone] o Mon-Fri 8:00-5:00 o Babies seen by [facility] providers o Does NOT accept Medicaid . [facility] [place] ([facility] [place]) o Eksir, MD o [address], [place],   [address] o [phone] o Mon-Thur 8:00-7:00, Fri 8:00-5:00, Sat 8:00-12:00 o Babies seen by providers at [facility] o Accepting Medicaid - but does not have vaccinations in office (must be received elsewhere) o Limited availability, please call early in hospitalization  [place] ([address]) . [facility]  o Charlene Flemming, MD o [address],   [address] o [phone]  Fax [phone]   

## 2020-02-26 NOTE — Addendum Note (Signed)
Addended by: Henrietta Dine on: 02/26/2020 11:28 AM   Modules accepted: Orders

## 2020-02-26 NOTE — Progress Notes (Signed)
History:   Deborah Singleton is a 25 y.o. [id number] at [redacted]w[redacted]d by LMP being seen today for her first obstetrical visit.  Her obstetrical history is significant for seizures. Patient does intend to breast feed. Pregnancy history fully reviewed.  Patient reports no complaints.     HISTORY: OB History  Gravida Para Term Preterm AB Living  $Remov'4 2 2 'txqFnd$ 0 1 2  SAB IAB Ectopic Multiple Live Births  0 1 0 0 2    # Outcome Date GA Lbr Len/2nd Weight Sex Delivery Anes PTL Lv  4 Current           3 Term 10/06/16    F   N LIV  2 Term 05/18/13 [redacted]w[redacted]d   M   N LIV     Birth Comments: System Generated. Please review and update pregnancy details.  1 IAB              Past Medical History:  Diagnosis Date  . Insufficient prenatal care in second trimester 01/20/2013  . Miscarriage 06/02/2019  . Seizures ([place])    last seizure Aug 2021  . Supervision of normal first teen pregnancy in second trimester 01/20/2013   Clinic: [facility]  95  Anatomic [place]   Glucose Screen   GBS   Feeding Preference   Contraception   Circumcision   Flu Vaccine: TDaP:    Past Surgical History:  Procedure Laterality Date  . TOOTH EXTRACTION     Family History  Problem Relation Age of Onset  . Asthma Mother   . Seizures Mother   . Depression Sister   . Diabetes Sister        younger sister  . Diabetes Father    Social History   Tobacco Use  . Smoking status: Never Smoker  . Smokeless tobacco: Never Used  Vaping Use  . Vaping Use: Never used  Substance Use Topics  . Alcohol use: Not Currently  . Drug use: No   Allergies  Allergen Reactions  . Acetaminophen Itching   Current Outpatient Medications on File Prior to Visit  Medication Sig Dispense Refill  . folic acid (FOLVITE) 1 MG tablet Take 4 tablets ($RemoveBe'4mg'QagLTWRel$ ) daily 120 tablet 11  . levETIRAcetam (KEPPRA) 500 MG tablet Take 1/2 tablet twice a day for 3 days, then increase to 1 tablet twice a day 60 tablet 11  . magnesium oxide (MAG-OX) 400 (241.3 Mg) MG tablet  Take 1 tablet (400 mg total) by mouth daily. 30 tablet 11  . vitamin B-6 (PYRIDOXINE) 25 MG tablet Take 1 tablet (25 mg total) by mouth 3 (three) times daily as needed. Take 1 tablet every 8 hours as needed for nausea in pregnancy 30 tablet 0   No current facility-administered medications on file prior to visit.    Review of Systems Pertinent items noted in HPI and remainder of comprehensive ROS otherwise negative.  Physical Exam:   Vitals:   02/26/20 0929  BP: 119/76  Pulse: 96  Weight: 237 lb 12.8 oz (107.9 kg)   Fetal Heart Rate (bpm): 160-US Bedside Ultrasound for FHR check: Viable intrauterine pregnancy with positive cardiac activity and fetal movement noted, fetal heart rate 160bpm Patient informed that the ultrasound is considered a limited obstetric ultrasound and is not intended to be a complete ultrasound exam.  Patient also informed that the ultrasound is not being completed with the intent of assessing for fetal or placental anomalies or any pelvic abnormalities.  Explained that the purpose of today's  ultrasound is to assess for fetal heart rate.  Patient acknowledges the purpose of the exam and the limitations of the study. General: well-developed, well-nourished female in no acute distress  Breasts:  normal appearance, no masses or tenderness bilaterally  Skin: normal coloration and turgor, no rashes  Neurologic: oriented, normal, negative, normal mood  Extremities: normal strength, tone, and muscle mass, ROM of all joints is normal  HEENT PERRLA, extraocular movement intact and sclera clear  Neck supple and no masses  Cardiovascular: regular rate and rhythm  Respiratory:  no respiratory distress, normal breath sounds  Abdomen: soft, non-tender; bowel sounds normal; no masses,  no organomegaly  Pelvic: normal external genitalia, no lesions, normal vaginal mucosa, yellow mucous discharge present, normal cervix, pap smear done. Uterine size:      Assessment:     Pregnancy: [id number] Patient Active Problem List   Diagnosis Date Noted  . Supervision of other normal pregnancy, antepartum 02/26/2020  . History of seizures 02/26/2020  . Obesity during pregnancy, antepartum 02/26/2020  . Generalized idiopathic epilepsy and epileptic syndromes, not intractable, without status epilepticus ([place]) 04/24/2018     Plan:    1. Supervision of other normal pregnancy, antepartum - Welcomed patient to practice and introduced self to patient  - Reviewed safety, visitor policy, reassurance about COVID-19 for pregnancy at this time. Discussed possible changes to visits, including televisits, that may occur due to COVID-19.  The office remains open if pt needs to be seen and MAU is open 24 hours/day for OB emergencies. - Routine prenatal care - Anticipatory guidance on upcoming appointments, educated with patient that she is high risk due to hx of epilepsy  - CBC/D/Plt+RPR+Rh+ABO+Rub Ab... - CHL AMB BABYSCRIPTS SCHEDULE OPTIMIZATION - Culture, OB Urine - Flu Vaccine QUAD 36+ mos IM - Genetic Screening - Cytology - PAP( [place]) - [place] MFM OB DETAIL +14 WK; Future - AFP, Serum, Open Spina Bifida - Cervicovaginal ancillary only( [place])  2. Seizure disorder in pregnancy, antepartum ([place]) - Managed by neurologist, patient reports that she has follow up appointment scheduled for repeat labs  - Patient [facility]   - Patient reports that she is on keppra and folic acid   3. Obesity during pregnancy, antepartum - Discussed recommended weight gain during pregnancy  - Hemoglobin A1c - aspirin EC 81 MG tablet; Take 1 tablet (81 mg total) by mouth daily. Take after 12 weeks for prevention of preeclampsia later in pregnancy  Dispense: 300 tablet; Refill: 0 - Protein / creatinine ratio, urine - Comp Met (CMET)  4. [redacted] weeks gestation of pregnancy   Initial labs drawn. Continue prenatal vitamins. Problem list reviewed  and updated. Genetic Screening discussed, AFP and NIPS: ordered. Ultrasound discussed; fetal anatomic survey: ordered. Anticipatory guidance about prenatal visits given including labs, ultrasounds, and testing. Discussed usage of Babyscripts and virtual visits as additional source of managing and completing prenatal visits in midst of coronavirus and pandemic.   Encouraged to complete MyChart Registration for her ability to review results, send requests, and have questions addressed.  The nature of [address] for [facility] with multiple MDs and Advanced Practice Providers was explained to patient; also emphasized that residents, students are part of our team. Routine obstetric precautions reviewed. Encouraged to seek out care at office or emergency room [facility] MAU preferred) for urgent and/or emergent concerns. Return in about 4 weeks (around 03/25/2020) for HROB, in person.     Verdene Lennert  Genelle Gather, [place] for [employer], [place]

## 2020-02-27 LAB — CERVICOVAGINAL ANCILLARY ONLY
Bacterial Vaginitis (gardnerella): POSITIVE — AB
Candida Glabrata: NEGATIVE
Candida Vaginitis: NEGATIVE
Comment: NEGATIVE
Comment: NEGATIVE
Comment: NEGATIVE

## 2020-02-27 LAB — CBC/D/PLT+RPR+RH+ABO+RUB AB...
Antibody Screen: NEGATIVE
Basophils Absolute: 0 10*3/uL (ref 0.0–0.2)
Basos: 0 %
EOS (ABSOLUTE): 0.1 10*3/uL (ref 0.0–0.4)
Eos: 1 %
HCV Ab: <0.1 s/co ratio (ref 0.0–0.9)
HIV Screen 4th Generation wRfx: NONREACTIVE
Hematocrit: 36.6 % (ref 34.0–46.6)
Hemoglobin: 12 g/dL (ref 11.1–15.9)
Hepatitis B Surface Ag: NEGATIVE
Immature Grans (Abs): 0 10*3/uL (ref 0.0–0.1)
Immature Granulocytes: 0 %
Lymphocytes Absolute: 2.4 10*3/uL (ref 0.7–3.1)
Lymphs: 32 %
MCH: 27 pg (ref 26.6–33.0)
MCHC: 32.8 g/dL (ref 31.5–35.7)
MCV: 82 fL (ref 79–97)
Monocytes Absolute: 0.8 10*3/uL (ref 0.1–0.9)
Monocytes: 11 %
Neutrophils Absolute: 4 10*3/uL (ref 1.4–7.0)
Neutrophils: 56 %
Platelets: 283 10*3/uL (ref 150–450)
RBC: 4.44 x10E6/uL (ref 3.77–5.28)
RDW: 14.2 % (ref 11.7–15.4)
RPR Ser Ql: NONREACTIVE
Rh Factor: POSITIVE
Rubella Antibodies, IGG: 3.03 index (ref >0.99)
WBC: 7.4 10*3/uL (ref 3.4–10.8)

## 2020-02-27 LAB — PROTEIN / CREATININE RATIO, URINE
Creatinine, Urine: 126.2 mg/dL
Protein, Ur: 8.7 mg/dL
Protein/Creat Ratio: 69 mg/g creat (ref 0–200)

## 2020-02-27 LAB — HEMOGLOBIN A1C
Est. average glucose Bld gHb Est-mCnc: 105 mg/dL
Hgb A1c MFr Bld: 5.3 % (ref 4.8–5.6)

## 2020-02-27 LAB — COMPREHENSIVE METABOLIC PANEL
ALT: 23 IU/L (ref 0–32)
AST: 24 IU/L (ref 0–40)
Albumin/Globulin Ratio: 1.4 (ref 1.2–2.2)
Albumin: 3.6 g/dL — ABNORMAL LOW (ref 3.9–5.0)
Alkaline Phosphatase: 61 IU/L (ref 44–121)
BUN/Creatinine Ratio: 7 — ABNORMAL LOW (ref 9–23)
BUN: 4 mg/dL — ABNORMAL LOW (ref 6–20)
Bilirubin Total: 0.2 mg/dL (ref 0.0–1.2)
CO2: 18 mmol/L — ABNORMAL LOW (ref 20–29)
Calcium: 9.2 mg/dL (ref 8.7–10.2)
Chloride: 107 mmol/L — ABNORMAL HIGH (ref 96–106)
Creatinine, Ser: 0.55 mg/dL — ABNORMAL LOW (ref 0.57–1.00)
GFR calc Af Amer: 153 mL/min/{1.73_m2} (ref >59)
GFR calc non Af Amer: 133 mL/min/{1.73_m2} (ref >59)
Globulin, Total: 2.6 g/dL (ref 1.5–4.5)
Glucose: 92 mg/dL (ref 65–99)
Potassium: 4.1 mmol/L (ref 3.5–5.2)
Sodium: 137 mmol/L (ref 134–144)
Total Protein: 6.2 g/dL (ref 6.0–8.5)

## 2020-02-27 LAB — HCV INTERPRETATION

## 2020-02-27 MED ORDER — METRONIDAZOLE 500 MG PO TABS: 500.0000 mg | ORAL_TABLET | Freq: Two times a day (BID) | ORAL | 0 refills | Status: DC

## 2020-02-27 NOTE — Addendum Note (Signed)
Addended by: Sharyon Cable on: 02/27/2020 02:46 PM   Modules accepted: Orders

## 2020-02-28 LAB — AFP, SERUM, OPEN SPINA BIFIDA
AFP MoM: 0.71
AFP Value: 20.1 ng/mL
Gest. Age on Collection Date: 16 weeks
Maternal Age At EDD: 23.8 yr
OSBR Risk 1 IN: 10000
Test Results:: NEGATIVE
Weight: 237 [lb_av]

## 2020-03-01 LAB — URINE CULTURE, OB REFLEX

## 2020-03-01 LAB — CULTURE, OB URINE

## 2020-03-02 LAB — CYTOLOGY - PAP
Chlamydia: NEGATIVE
Comment: NEGATIVE
Comment: NEGATIVE
Comment: NORMAL
Diagnosis: NEGATIVE
Neisseria Gonorrhea: NEGATIVE
Trichomonas: NEGATIVE

## 2020-03-08 ENCOUNTER — Other Ambulatory Visit: Payer: Self-pay | Admitting: Certified Nurse Midwife

## 2020-03-15 ENCOUNTER — Other Ambulatory Visit: Payer: Self-pay | Admitting: Obstetrics and Gynecology

## 2020-03-15 ENCOUNTER — Other Ambulatory Visit: Payer: Self-pay

## 2020-03-15 DIAGNOSIS — Z3480 Encounter for supervision of other normal pregnancy, unspecified trimester: Secondary | ICD-10-CM

## 2020-03-15 NOTE — Patient Instructions (Signed)
Hi Deborah Singleton, thank you for speaking with me today.  Deborah Singleton was given information about Medicaid Managed Care team care coordination services as a part of their Healthy [facility] Medicaid benefit. Deborah Singleton verbally consented to engagement with the [facility] Managed Care team.   For questions related to your Healthy [facility] health plan, please call: [phone] or visit the homepage here: [email or web address]  If you would like to schedule transportation through your Healthy [facility] plan, please call the following number at least 2 days in advance of your appointment: [phone]  Deborah Singleton - following are the goals we discussed in your visit today:  Goals Addressed            This Visit's Progress   . Protect My Health       Timeframe:  Long-Range Goal Priority:  High Start Date:    03/15/20                         Expected End Date:   06/12/20                    Follow Up Date 03/22/20  - schedule appointment for vaccines needed due to my age or health - schedule recommended health tests (blood work, mammogram, colonoscopy, pap test) - schedule and keep appointment for annual check-up    Why is this important?    Screening tests can find diseases early when they are easier to treat.   Your doctor or nurse will talk with you about which tests are important for you.   Getting shots for common diseases like the flu and shingles will help prevent them.          Patient verbalizes understanding of instructions provided today.   The Managed Medicaid care management team will reach out to the patient again over the next 7 days.  The patient has been provided with contact information for the Managed Medicaid care management team and has been advised to call with any health related questions or concerns.   Kathi Der RN, BSN [place]  [facility] Care Management Coordinator - Managed Medicaid High  Risk [phone]  Following is a copy of your plan of care:  Patient Care Plan: General Plan of Care (Adult)    Problem Identified: Health Promotion or Disease Self-Management (General Plan of Care)     Long-Range Goal: Self-Management Plan Developed   Start Date: 03/15/2020  Expected End Date: 09/12/2020  This Visit's Progress: Not on track  Priority: High  Note:   CARE PLAN ENTRY Medicaid Managed Care (see longitudinal plan of care for additional care plan information)  Current Barriers: Marland Kitchen Knowledge Deficits related to recommended prenatal care . Care Coordination needs related to pregnancy-nausea and vomiting  Nurse Case Manager Clinical Goal(s):  Marland Kitchen Over the next 7 days, patient will verbalize understanding of recommended antepartum plan . Over the next 7 days, patient will work with provider to address needs related to antepartum care . Over the next 7 days, patient will verbalize understanding of plan for nausea and vomiting . Over the next 7 days, patient will work with provider to address needs related to nausea and vomiting in pregnancy. . Over the next 90 days, patient will attend all scheduled medical appointments:   Interventions:  . Inter-disciplinary care team collaboration (see longitudinal plan of care) . Evaluation of current treatment plan related to antepartum care and patient's adherence to plan as established  by provider. . Discussed plans with patient for ongoing care management follow up and provided patient with direct contact information for care management team . Evaluation of current treatment plan related to pregnancy and patient's adherence to plan as established by provider. . Advised patient to contact provider regarding nausea and vomiting. . Provided education to patient re: nausea and vomiting. . Reviewed medications with patient. . Discussed plans with patient for ongoing care management follow up and provided patient with direct contact  information for care management team . Reviewed scheduled/upcoming provider appointments.  Patient Self Care Activities:  . Patient verbalizes understanding of plan to contact provider. . Self administers medications as prescribed . Attends all scheduled provider appointments . Calls pharmacy for medication refills . Calls provider office for new concerns or questions   Evidence-based guidance:    Review biopsychosocial determinants of health screens.   Review need for preventive screening based on age, sex, family history and health history.   Determine level of modifiable health risk.   Assess level of child and caregiver activation, level of readiness, importance and confidence to make changes.   Discuss identified risks.   Identify areas where behavior change may lead to improved health.   Promote healthy lifestyle.   Evoke change talk using open-ended questions, pros and cons, as well as looking forward.   Identify and manage conditions or preconditions to reduce health risk.   Implement additional goals and interventions based on identified risk factors.   Include child's contributions based on age and understanding to self-management plan.   Support child and caregiver active participation in decision-making and self-management plan.

## 2020-03-15 NOTE — Patient Outreach (Signed)
Medicaid Managed Care   Nurse Care Manager Note  03/15/2020 Name:  Deborah Singleton MRN:  [id number] DOB:  01/07/1996  Deborah Singleton is an 25 y.o. year old female who is a primary patient of Patient, No Pcp Per.  The [facility] Managed Care Coordination team was consulted for assistance with:    Obstetrics healthcare management needs  Ms. Macy was given information about Medicaid Managed Care Coordination team services today. Lakeishia Neeb agreed to services and verbal consent obtained.  Engaged with patient by telephone for follow up visit in response to provider referral for case management and/or care coordination services.   Assessments/Interventions:  Review of past medical history, allergies, medications, health status, including review of consultants reports, laboratory and other test data, was performed as part of comprehensive evaluation and provision of chronic care management services.  SDOH (Social Determinants of Health) assessments and interventions performed:   Care Plan  Allergies  Allergen Reactions  . Acetaminophen Itching    Medications Reviewed Today    Reviewed by Danie Chandler, RN (Registered Nurse) on 03/15/20 at 1508  Med List Status: <None>  Medication Order Taking? Sig Documenting Provider Last Dose Status Informant  aspirin EC 81 MG tablet [id number] Yes Take 1 tablet (81 mg total) by mouth daily. Take after 12 weeks for prevention of preeclampsia later in pregnancy Sharyon Cable, CNM Taking Active   Blood Pressure Monitoring DEVI [id number] No 1 each by Does not apply route once a week.  Patient not taking: Reported on 03/15/2020   Sharyon Cable, CNM Not Taking Active   folic acid (FOLVITE) 1 MG tablet [id number] Yes Take 4 tablets (4mg ) daily , MD Taking Active   levETIRAcetam (KEPPRA) 500 MG tablet Van Clines Yes Take 1/2 tablet twice a day for 3 days, then increase to 1 tablet twice a day [id number], MD Taking Active   magnesium  oxide (MAG-OX) 400 (241.3 Mg) MG tablet Van Clines Yes Take 1 tablet (400 mg total) by mouth daily. [id number], MD Taking Active   metroNIDAZOLE (FLAGYL) 500 MG tablet Van Clines Yes Take 1 tablet (500 mg total) by mouth 2 (two) times daily. [id number], CNM Taking Active   vitamin B-6 (PYRIDOXINE) 25 MG tablet Sharyon Cable Yes Take 1 tablet (25 mg total) by mouth 3 (three) times daily as needed. Take 1 tablet every 8 hours as needed for nausea in pregnancy [id number], PA-C Taking Active           Patient Active Problem List   Diagnosis Date Noted  . Supervision of other normal pregnancy, antepartum 02/26/2020  . Seizure disorder in pregnancy, antepartum (HCC) 02/26/2020  . Obesity during pregnancy, antepartum 02/26/2020  . Generalized idiopathic epilepsy and epileptic syndromes, not intractable, without status epilepticus (HCC) 04/24/2018    Conditions to be addressed/monitored per PCP order:  obstetric healthcare management needs-nausea, vomiting, headaches, epilepsy  Care Plan : General Plan of Care (Adult)  Updates made by 04/26/2018, RN since 03/15/2020 12:00 AM    Problem: Health Promotion or Disease Self-Management (General Plan of Care)     Long-Range Goal: Self-Management Plan Developed   Start Date: 03/15/2020  Expected End Date: 09/12/2020  This Visit's Progress: Not on track  Priority: High  Note:   CARE PLAN ENTRY Medicaid Managed Care (see longitudinal plan of care for additional care plan information)  Current Barriers: 09/14/2020 Knowledge Deficits related to recommended prenatal care . Care Coordination needs related to pregnancy-nausea and vomiting  Nurse Case Manager Clinical Goal(s):  Marland Kitchen Over the next 7 days, patient will verbalize understanding of recommended antepartum plan . Over the next 7 days, patient will work with provider to address needs related to antepartum care . Over the next 7 days, patient will verbalize understanding of plan for nausea and  vomiting . Over the next 7 days, patient will work with provider to address needs related to nausea and vomiting in pregnancy. . Over the next 90 days, patient will attend all scheduled medical appointments:   Interventions:  . Inter-disciplinary care team collaboration (see longitudinal plan of care) . Evaluation of current treatment plan related to antepartum care and patient's adherence to plan as established by provider. . Discussed plans with patient for ongoing care management follow up and provided patient with direct contact information for care management team . Evaluation of current treatment plan related to pregnancy and patient's adherence to plan as established by provider. . Advised patient to contact provider regarding nausea and vomiting. . Provided education to patient re: nausea and vomiting. . Reviewed medications with patient. . Discussed plans with patient for ongoing care management follow up and provided patient with direct contact information for care management team . Reviewed scheduled/upcoming provider appointments.  Patient Self Care Activities:  . Patient verbalizes understanding of plan to contact provider. . Self administers medications as prescribed . Attends all scheduled provider appointments . Calls pharmacy for medication refills . Calls provider office for new concerns or questions   Evidence-based guidance:    Review biopsychosocial determinants of health screens.   Review need for preventive screening based on age, sex, family history and health history.   Determine level of modifiable health risk.   Assess level of child and caregiver activation, level of readiness, importance and confidence to make changes.   Discuss identified risks.   Identify areas where behavior change may lead to improved health.   Promote healthy lifestyle.   Evoke change talk using open-ended questions, pros and cons, as well as looking forward.   Identify and  manage conditions or preconditions to reduce health risk.   Implement additional goals and interventions based on identified risk factors.   Include child's contributions based on age and understanding to self-management plan.   Support child and caregiver active participation in decision-making and self-management plan.             Follow Up:  Patient agrees to Care Plan and Follow-up.  Plan: The Managed Medicaid care management team will reach out to the patient again over the next 7 days. and The patient has been provided with contact information for the Managed Medicaid care management team and has been advised to call with any health related questions or concerns.  Date/time of next scheduled RN care management/care coordination outreach:  03/22/20 at 3:00

## 2020-03-18 ENCOUNTER — Other Ambulatory Visit: Payer: Self-pay | Admitting: Certified Nurse Midwife

## 2020-03-22 ENCOUNTER — Other Ambulatory Visit: Payer: Self-pay | Admitting: Obstetrics and Gynecology

## 2020-03-22 NOTE — Patient Instructions (Signed)
Hi Deborah Singleton I missed you today-I hope you are feeling better. - as a part of your Medicaid benefit, you are eligible for care management and care coordination services at no cost or copay. I was unable to reach you by phone today but would be happy to help you with your health related needs. Please feel free to call meat [phone]  A member of the Managed Medicaid care management team will reach out to you again over the next 7 days.   Kathi Der RN, BSN [place]  [facility] [employer] - Managed Medicaid High Risk [phone].

## 2020-03-22 NOTE — Patient Outreach (Signed)
Care Coordination  03/22/2020  Maura Wolfman 1995-08-28 [id number]    Medicaid Managed Care   Unsuccessful Outreach Note  03/22/2020 Name: Anyjah Roundtree MRN: [id number] DOB: 1995/03/09  Referred by: Patient, No Pcp Per Reason for referral : High Risk Managed Medicaid (Unsuccessful telephone outreach)   An unsuccessful telephone outreach was attempted today. The patient was referred to the case management team for assistance with care management and care coordination.   Follow Up Plan: A member of the Managed Medicaid  care management team will reach out to the patient again over the next 7 days.   Kathi Der RN, BSN [place]  [facility] - Managed Medicaid High Risk [phone].

## 2020-03-25 ENCOUNTER — Encounter: Payer: Medicaid Other | Admitting: Family Medicine

## 2020-03-26 ENCOUNTER — Ambulatory Visit: Payer: Medicaid Other | Admitting: *Deleted

## 2020-03-26 ENCOUNTER — Encounter: Payer: Medicaid Other | Admitting: Family Medicine

## 2020-03-26 ENCOUNTER — Encounter: Payer: Self-pay | Admitting: *Deleted

## 2020-03-26 ENCOUNTER — Ambulatory Visit (INDEPENDENT_AMBULATORY_CARE_PROVIDER_SITE_OTHER): Payer: Medicaid Other | Admitting: Family Medicine

## 2020-03-26 ENCOUNTER — Other Ambulatory Visit: Payer: Self-pay

## 2020-03-26 ENCOUNTER — Ambulatory Visit: Payer: Medicaid Other | Attending: Certified Nurse Midwife

## 2020-03-26 ENCOUNTER — Other Ambulatory Visit: Payer: Self-pay | Admitting: *Deleted

## 2020-03-26 VITALS — BP 110/58 | HR 73 | Wt 234.7 lb

## 2020-03-26 DIAGNOSIS — O99350 Diseases of the nervous system complicating pregnancy, unspecified trimester: Secondary | ICD-10-CM

## 2020-03-26 DIAGNOSIS — G40909 Epilepsy, unspecified, not intractable, without status epilepticus: Secondary | ICD-10-CM

## 2020-03-26 DIAGNOSIS — O219 Vomiting of pregnancy, unspecified: Secondary | ICD-10-CM

## 2020-03-26 DIAGNOSIS — Z3480 Encounter for supervision of other normal pregnancy, unspecified trimester: Secondary | ICD-10-CM | POA: Insufficient documentation

## 2020-03-26 DIAGNOSIS — Z362 Encounter for other antenatal screening follow-up: Secondary | ICD-10-CM

## 2020-03-26 DIAGNOSIS — Z3A17 17 weeks gestation of pregnancy: Secondary | ICD-10-CM

## 2020-03-26 MED ORDER — ONDANSETRON 4 MG PO TBDP: 4.0000 mg | ORAL_TABLET | Freq: Four times a day (QID) | ORAL | 3 refills | Status: DC | PRN

## 2020-03-26 NOTE — Progress Notes (Signed)
   PRENATAL VISIT NOTE  Subjective:  Deborah Singleton is a 25 y.o. [id number] at [redacted]w[redacted]d being seen today for ongoing prenatal care.  She is currently monitored for the following issues for this high-risk pregnancy and has Generalized idiopathic epilepsy and epileptic syndromes, not intractable, without status epilepticus (HCC); Supervision of other normal pregnancy, antepartum; Seizure disorder in pregnancy, antepartum (HCC); Obesity during pregnancy, antepartum; and Maternal varicella, non-immune on their problem list.  Patient reports no complaints.  Contractions: Not present. Vag. Bleeding: None.  Movement: Present. Denies leaking of fluid.   The following portions of the patient's history were reviewed and updated as appropriate: allergies, current medications, past family history, past medical history, past social history, past surgical history and problem list.   Objective:   Vitals:   03/26/20 1018  BP: (!) 110/58  Pulse: 73  Weight: 234 lb 11.2 oz (106.5 kg)    Fetal Status:     Movement: Present     General:  Alert, oriented and cooperative. Patient is in no acute distress.  Skin: Skin is warm and dry. No rash noted.   Cardiovascular: Normal heart rate noted  Respiratory: Normal respiratory effort, no problems with respiration noted  Abdomen: Soft, gravid, appropriate for gestational age.  Pain/Pressure: Present     Pelvic: Cervical exam deferred        Extremities: Normal range of motion.  Edema: None  Mental Status: Normal mood and affect. Normal behavior. Normal judgment and thought content.   Assessment and Plan:  Pregnancy: [id number] at [redacted]w[redacted]d 1. [redacted] weeks gestation of pregnancy  2. Supervision of other normal pregnancy, antepartum FHT and FH normal. Rpt AFP. Change dating - AFP, Serum, Open Spina Bifida  3. Seizure disorder in pregnancy, antepartum (HCC) Increase keppra to 1 tab twice a day  4. Nausea and vomiting in pregnancy Start zofran  Preterm labor symptoms and  general obstetric precautions including but not limited to vaginal bleeding, contractions, leaking of fluid and fetal movement were reviewed in detail with the patient. Please refer to After Visit Summary for other counseling recommendations.   No follow-ups on file.  Future Appointments  Date Time Provider Department Center  04/22/2020  3:15 PM Hermina Staggers, MD [facility]  04/23/2020  2:45 PM [facility] NURSE [facility]  04/23/2020  3:00 PM [facility] US1 [facility]  05/13/2020  9:30 AM Van Clines, MD [facility] None    Levie Heritage, DO

## 2020-03-28 LAB — AFP, SERUM, OPEN SPINA BIFIDA
AFP MoM: 2.26
AFP Value: 74.3 ng/mL
Gest. Age on Collection Date: 17.1 weeks
Maternal Age At EDD: 23.9 yr
OSBR Risk 1 IN: 925
Test Results:: NEGATIVE
Weight: 234 [lb_av]

## 2020-04-02 ENCOUNTER — Encounter: Payer: Self-pay | Admitting: *Deleted

## 2020-04-14 NOTE — Progress Notes (Signed)
Please disregard ,opened in error 

## 2020-04-20 ENCOUNTER — Other Ambulatory Visit: Payer: Self-pay

## 2020-04-20 ENCOUNTER — Telehealth: Payer: Self-pay | Admitting: Lactation Services

## 2020-04-20 ENCOUNTER — Ambulatory Visit (INDEPENDENT_AMBULATORY_CARE_PROVIDER_SITE_OTHER): Payer: Medicaid Other | Admitting: Lactation Services

## 2020-04-20 ENCOUNTER — Encounter: Payer: Self-pay | Admitting: Lactation Services

## 2020-04-20 DIAGNOSIS — Z0130 Encounter for examination of blood pressure without abnormal findings: Secondary | ICD-10-CM

## 2020-04-20 MED ORDER — COMFORT FIT MATERNITY SUPP LG MISC: 1.0000 | Freq: Once | 0 refills | Status: AC

## 2020-04-20 NOTE — Telephone Encounter (Signed)
Called patient to ask her to come in today for BP check. She will come in at 2:30 for nurse visit for BP check.

## 2020-04-20 NOTE — Progress Notes (Signed)
Patient in the office with concerns with swelling in her feet that has been occurring for 1-2 weeks when standing a lot. She has been propping them up some and that helps. She reports she is driking a lot of water. She feels like she is eating well. She has no swelling today.   Patient reports Headache for about 24 hours that went away today. It was on the left side near her eye. She is not having blurred vision or dizziness.   Patient reports she does not have a BP cuff at home as she did not pick up previously. She had Medicaid prior to pregnancy, she is to check with [facility] on getting her BP cuff. Reviewed checking BP once a week and entering in [employer]. Reviewed an abnormal BP is >/= 140/90 and that office needs to know if she has high BP.   Reviewed with patient that some swelling is normal with pregnancy as the pregnancy progresses. Reviewed if swelling noted, please elevate feet, take frequent short walks and drink lots of water. If swelling does not decrease or is accompanied by headache, blurred Vision or dizziness to let us know.   Maternity Support Belt prescription written at patients request. She has no other concerns at this time.   Patient to follow up in the office on 3/24 and with MFM on 3/25.

## 2020-04-22 ENCOUNTER — Other Ambulatory Visit: Payer: Self-pay

## 2020-04-22 ENCOUNTER — Encounter: Payer: Self-pay | Admitting: Obstetrics and Gynecology

## 2020-04-22 ENCOUNTER — Ambulatory Visit (INDEPENDENT_AMBULATORY_CARE_PROVIDER_SITE_OTHER): Payer: Medicaid Other | Admitting: Obstetrics and Gynecology

## 2020-04-22 VITALS — BP 116/75 | HR 103 | Wt 235.8 lb

## 2020-04-22 DIAGNOSIS — G40909 Epilepsy, unspecified, not intractable, without status epilepticus: Secondary | ICD-10-CM

## 2020-04-22 DIAGNOSIS — Z3480 Encounter for supervision of other normal pregnancy, unspecified trimester: Secondary | ICD-10-CM | POA: Diagnosis not present

## 2020-04-22 DIAGNOSIS — O99350 Diseases of the nervous system complicating pregnancy, unspecified trimester: Secondary | ICD-10-CM

## 2020-04-22 NOTE — Progress Notes (Signed)
Subjective:  Deborah Singleton is a 25 y.o. [id number] at [redacted]w[redacted]d being seen today for ongoing prenatal care.  She is currently monitored for the following issues for this high-risk pregnancy and has Generalized idiopathic epilepsy and epileptic syndromes, not intractable, without status epilepticus (HCC); Supervision of other normal pregnancy, antepartum; Seizure disorder in pregnancy, antepartum (HCC); Obesity during pregnancy, antepartum; and Maternal varicella, non-immune on their problem list.  Patient reports no complaints.  Contractions: Not present. Vag. Bleeding: None.  Movement: Present. Denies leaking of fluid.   The following portions of the patient's history were reviewed and updated as appropriate: allergies, current medications, past family history, past medical history, past social history, past surgical history and problem list. Problem list updated.  Objective:   Vitals:   04/22/20 1540  BP: 116/75  Pulse: (!) 103  Weight: 235 lb 12.8 oz (107 kg)    Fetal Status: Fetal Heart Rate (bpm): 154   Movement: Present     General:  Alert, oriented and cooperative. Patient is in no acute distress.  Skin: Skin is warm and dry. No rash noted.   Cardiovascular: Normal heart rate noted  Respiratory: Normal respiratory effort, no problems with respiration noted  Abdomen: Soft, gravid, appropriate for gestational age. Pain/Pressure: Present     Pelvic:  Cervical exam deferred        Extremities: Normal range of motion.  Edema: None  Mental Status: Normal mood and affect. Normal behavior. Normal judgment and thought content.   Urinalysis:      Assessment and Plan:  Pregnancy: [id number] at [redacted]w[redacted]d  1. Supervision of other normal pregnancy, antepartum Stable Anatomy scan tomorrow   2. Seizure disorder in pregnancy, antepartum (HCC) Stable on Keppra  Preterm labor symptoms and general obstetric precautions including but not limited to vaginal bleeding, contractions, leaking of fluid and  fetal movement were reviewed in detail with the patient. Please refer to After Visit Summary for other counseling recommendations.  Return in about 4 weeks (around 05/20/2020) for OB visit, virtual.   Hermina Staggers, MD

## 2020-04-22 NOTE — Progress Notes (Signed)
Medicaid Home Form Completed-04/22/20

## 2020-04-22 NOTE — Patient Instructions (Signed)

## 2020-04-23 ENCOUNTER — Ambulatory Visit: Payer: Medicaid Other | Admitting: *Deleted

## 2020-04-23 ENCOUNTER — Other Ambulatory Visit: Payer: Self-pay

## 2020-04-23 ENCOUNTER — Encounter: Payer: Self-pay | Admitting: *Deleted

## 2020-04-23 ENCOUNTER — Ambulatory Visit: Payer: Medicaid Other | Attending: Obstetrics and Gynecology

## 2020-04-23 DIAGNOSIS — O99350 Diseases of the nervous system complicating pregnancy, unspecified trimester: Secondary | ICD-10-CM | POA: Insufficient documentation

## 2020-04-23 DIAGNOSIS — G40909 Epilepsy, unspecified, not intractable, without status epilepticus: Secondary | ICD-10-CM | POA: Insufficient documentation

## 2020-04-23 DIAGNOSIS — Z362 Encounter for other antenatal screening follow-up: Secondary | ICD-10-CM | POA: Insufficient documentation

## 2020-04-26 ENCOUNTER — Other Ambulatory Visit: Payer: Self-pay | Admitting: *Deleted

## 2020-04-26 DIAGNOSIS — Z6841 Body Mass Index (BMI) 40.0 and over, adult: Secondary | ICD-10-CM

## 2020-04-27 ENCOUNTER — Other Ambulatory Visit: Payer: Self-pay

## 2020-04-27 ENCOUNTER — Other Ambulatory Visit: Payer: Self-pay | Admitting: Obstetrics and Gynecology

## 2020-04-27 NOTE — Patient Outreach (Signed)
Care Coordination  04/27/2020  Marlyn Krawiec [birth year] [id number]   Raelea Moncada was referred to the [facility] Managed Care High Risk team for assistance with care coordination and care management services. Care coordination/care management services as part of the Medicaid benefit was offered to the patient today. The patient declined assistance offered today.   Plan: The Medicaid Managed Care High Risk team is available at any time in the future to assist with care coordination/care management services upon referral.   Kathi Der RN, BSN [place]  [facility] Care Management Coordinator - Managed [place] High Risk [phone].

## 2020-05-02 ENCOUNTER — Encounter (HOSPITAL_COMMUNITY): Payer: Self-pay | Admitting: Obstetrics and Gynecology

## 2020-05-02 ENCOUNTER — Other Ambulatory Visit: Payer: Self-pay

## 2020-05-02 ENCOUNTER — Inpatient Hospital Stay (HOSPITAL_COMMUNITY)
Admission: AD | Admit: 2020-05-02 | Discharge: 2020-05-02 | Disposition: A | Discharge status: Home / Self Care | Payer: Medicaid Other | Attending: Obstetrics and Gynecology | Admitting: Obstetrics and Gynecology

## 2020-05-02 ENCOUNTER — Ambulatory Visit
Admission: EM | Admit: 2020-05-02 | Discharge: 2020-05-02 | Discharge status: Against Medical Advice | Payer: Medicaid Other

## 2020-05-02 DIAGNOSIS — R1032 Left lower quadrant pain: Secondary | ICD-10-CM | POA: Diagnosis not present

## 2020-05-02 DIAGNOSIS — O99612 Diseases of the digestive system complicating pregnancy, second trimester: Secondary | ICD-10-CM | POA: Insufficient documentation

## 2020-05-02 DIAGNOSIS — O26892 Other specified pregnancy related conditions, second trimester: Secondary | ICD-10-CM | POA: Diagnosis not present

## 2020-05-02 DIAGNOSIS — Z3A22 22 weeks gestation of pregnancy: Secondary | ICD-10-CM | POA: Diagnosis not present

## 2020-05-02 DIAGNOSIS — Z8759 Personal history of other complications of pregnancy, childbirth and the puerperium: Secondary | ICD-10-CM | POA: Insufficient documentation

## 2020-05-02 DIAGNOSIS — R109 Unspecified abdominal pain: Secondary | ICD-10-CM | POA: Diagnosis not present

## 2020-05-02 DIAGNOSIS — Z7982 Long term (current) use of aspirin: Secondary | ICD-10-CM | POA: Diagnosis not present

## 2020-05-02 DIAGNOSIS — K5900 Constipation, unspecified: Secondary | ICD-10-CM | POA: Diagnosis not present

## 2020-05-02 LAB — URINALYSIS, ROUTINE W REFLEX MICROSCOPIC
Bilirubin Urine: NEGATIVE
Glucose, UA: NEGATIVE mg/dL
Hgb urine dipstick: NEGATIVE
Ketones, ur: NEGATIVE mg/dL
Nitrite: NEGATIVE
Protein, ur: NEGATIVE mg/dL
Specific Gravity, Urine: 1.017 (ref 1.005–1.030)
Squamous Epithelial / HPF: >50 — ABNORMAL HIGH (ref 0–5)
pH: 7 (ref 5.0–8.0)

## 2020-05-02 MED ORDER — IBUPROFEN 600 MG PO TABS
600.0000 mg | ORAL_TABLET | Freq: Once | ORAL | Status: AC
  Administered 2020-05-02: 600 mg via ORAL
  Filled 2020-05-02: qty 1

## 2020-05-02 NOTE — Discharge Instructions (Signed)
PREGNANCY SUPPORT BELT: You are not alone, Seventy-five percent of women have some sort of abdominal or back pain at some point in their pregnancy. Your baby is growing at a fast pace, which means that your whole body is rapidly trying to adjust to the changes. As your uterus grows, your back may start feeling a bit under stress and this can result in back or abdominal pain that can go from mild, and therefore bearable, to severe pains that will not allow you to sit or lay down comfortably, When it comes to dealing with pregnancy-related pains and cramps, some pregnant women usually prefer natural remedies, which the market is filled with nowadays. For example, wearing a pregnancy support belt can help ease and lessen your discomfort and pain. WHAT ARE THE BENEFITS OF WEARING A PREGNANCY SUPPORT BELT? A pregnancy support belt provides support to the lower portion of the belly taking some of the weight of the growing uterus and distributing to the other parts of your body. It is designed make you comfortable and gives you extra support. Over the years, the pregnancy apparel market has been studying the needs and wants of pregnant women and they have come up with the most comfortable pregnancy support belts that woman could ever ask for. In fact, you will no longer have to wear a stretched-out or bulky pregnancy belt that is visible underneath your clothes and makes you feel even more uncomfortable. Nowadays, a pregnancy support belt is made of comfortable and stretchy materials that will not irritate your skin but will actually make you feel at ease and you will not even notice you are wearing it. They are easy to put on and adjust during the day and can be worn at night for additional support.  BENEFITS: . Relives Back pain . Relieves Abdominal Muscle and Leg Pain . Stabilizes the Pelvic Ring . Offers a Cushioned Abdominal Lift Pad . Relieves pressure on the Sciatic Nerve Within  Minutes     Constipation, Adult Constipation is when a person has fewer than three bowel movements in a week, has difficulty having a bowel movement, or has stools (feces) that are dry, hard, or larger than normal. Constipation may be caused by an underlying condition. It may become worse with age if a person takes certain medicines and does not take in enough fluids. Follow these instructions at home: Eating and drinking  Eat foods that have a lot of fiber, such as beans, whole grains, and fresh fruits and vegetables.  Limit foods that are low in fiber and high in fat and processed sugars, such as fried or sweet foods. These include french fries, hamburgers, cookies, candies, and soda.  Drink enough fluid to keep your urine pale yellow.   General instructions  Exercise regularly or as told by your health care provider. Try to do 150 minutes of moderate exercise each week.  Use the bathroom when you have the urge to go. Do not hold it in.  Take over-the-counter and prescription medicines only as told by your health care provider. This includes any fiber supplements.  During bowel movements: ? Practice deep breathing while relaxing the lower abdomen. ? Practice pelvic floor relaxation.  Watch your condition for any changes. Let your health care provider know about them.  Keep all follow-up visits as told by your health care provider. This is important. Contact a health care provider if:  You have pain that gets worse.  You have a fever.  You do not  have a bowel movement after 4 days.  You vomit.  You are not hungry or you lose weight.  You are bleeding from the opening between the buttocks (anus).  You have thin, pencil-like stools. Get help right away if:  You have a fever and your symptoms suddenly get worse.  You leak stool or have blood in your stool.  Your abdomen is bloated.  You have severe pain in your abdomen.  You feel dizzy or you  faint. Summary  Constipation is when a person has fewer than three bowel movements in a week, has difficulty having a bowel movement, or has stools (feces) that are dry, hard, or larger than normal.  Eat foods that have a lot of fiber, such as beans, whole grains, and fresh fruits and vegetables.  Drink enough fluid to keep your urine pale yellow.  Take over-the-counter and prescription medicines only as told by your health care provider. This includes any fiber supplements. This information is not intended to replace advice given to you by your health care provider. Make sure you discuss any questions you have with your health care provider. Document Revised: 12/04/2018 Document Reviewed: 12/04/2018 Elsevier Patient Education  2021 [employer].

## 2020-05-02 NOTE — ED Notes (Signed)
Pt sts abd and back pain; pt is currently 6 months pregnant; told limitations and pt to go to MAU for eval

## 2020-05-02 NOTE — MAU Note (Addendum)
Pt reports intermittent pain on lower left abdomen that goes to her back. This started at 4 a.m. today.Does not think it's contractions. Rating pain 8.  She has tried ice, but did not help.States she has fluid come out when she sneezes, does not know when this began, but only happens then. Denies, VB, or other VAG discharge.

## 2020-05-02 NOTE — MAU Provider Note (Signed)
History     CSN: [id number]  Arrival date and time: 05/02/20 1230   Event Date/Time   First Provider Initiated Contact with Patient 05/02/20 1358      Chief Complaint  Patient presents with  . Abdominal Pain   HPI Deborah Singleton is a 25 y.o. [id number] at [redacted]w[redacted]d who presents with abdominal pain. Symptoms started early this morning. Reports constant cramping pain in LLQ that radiates to her left lower back. Tried applying ice to area without relief. Rates pain 6/10. Nothing makes pain better or worse. Last BM was 2 days ago; reports she normally goes daily & is not taking anything to treat constipation. Denies dysuria, fever, n/v/d, vaginal bleeding, or vaginal discharge.   OB History    Gravida  4   Para  2   Term  2   Preterm      AB  1   Living  2     SAB      IAB  1   Ectopic      Multiple      Live Births  2           Past Medical History:  Diagnosis Date  . Miscarriage 06/02/2019  . Seizures (HCC)    last seizure Aug 2021    Past Surgical History:  Procedure Laterality Date  . TOOTH EXTRACTION      Family History  Problem Relation Age of Onset  . Asthma Mother   . Seizures Mother   . Depression Sister   . Diabetes Sister        younger sister  . Diabetes Father     Social History   Tobacco Use  . Smoking status: Never Smoker  . Smokeless tobacco: Never Used  Vaping Use  . Vaping Use: Never used  Substance Use Topics  . Alcohol use: Not Currently  . Drug use: No    Allergies:  Allergies  Allergen Reactions  . Acetaminophen Itching    Medications Prior to Admission  Medication Sig Dispense Refill Last Dose  . aspirin EC 81 MG tablet Take 1 tablet (81 mg total) by mouth daily. Take after 12 weeks for prevention of preeclampsia later in pregnancy 300 tablet 0 05/01/2020 at Unknown time  . folic acid (FOLVITE) 1 MG tablet Take 4 tablets (4mg ) daily 120 tablet 11 05/01/2020 at Unknown time  . levETIRAcetam (KEPPRA) 500 MG tablet Take  1/2 tablet twice a day for 3 days, then increase to 1 tablet twice a day 60 tablet 11 05/01/2020 at Unknown time  . magnesium oxide (MAG-OX) 400 (241.3 Mg) MG tablet Take 1 tablet (400 mg total) by mouth daily. 30 tablet 11 05/01/2020 at Unknown time  . Prenatal Vit-Fe Fumarate-FA (PRENATAL MULTIVITAMIN) TABS tablet Take 1 tablet by mouth daily at 12 noon.   05/01/2020 at Unknown time  . Blood Pressure Monitoring DEVI 1 each by Does not apply route once a week. (Patient not taking: No sig reported) 1 each 0     Review of Systems  Constitutional: Negative.   Gastrointestinal: Positive for abdominal pain and constipation. Negative for diarrhea, nausea and vomiting.  Genitourinary: Negative.    Physical Exam   Blood pressure 121/68, pulse 92, temperature 98 F (36.7 C), temperature source Oral, resp. rate 18, height 5' (1.524 m), weight 108.5 kg, last menstrual period 11/06/2019, SpO2 98 %.  Physical Exam Vitals and nursing note reviewed. Exam conducted with a chaperone present.  Constitutional:      General:  She is not in acute distress.    Appearance: She is well-developed. She is obese.  HENT:     Head: Normocephalic and atraumatic.  Pulmonary:     Effort: Pulmonary effort is normal. No respiratory distress.  Abdominal:     Palpations: Abdomen is soft.     Tenderness: There is no abdominal tenderness. There is no right CVA tenderness or left CVA tenderness.  Genitourinary:    Comments: Dilation: Closed Effacement (%): Thick Cervical Position: Posterior Station: Ballotable,-3 Exam by:: Judeth Horn NP  Skin:    General: Skin is warm and dry.  Neurological:     Mental Status: She is alert.  Psychiatric:        Mood and Affect: Mood normal.        Behavior: Behavior normal.     MAU Course  Procedures Results for orders placed or performed during the hospital encounter of 05/02/20 (from the past 24 hour(s))  Urinalysis, Routine w reflex microscopic Urine, Clean Catch      Status: Abnormal   Collection Time: 05/02/20  1:30 PM  Result Value Ref Range   Color, Urine YELLOW YELLOW   APPearance CLOUDY (A) CLEAR   Specific Gravity, Urine 1.017 1.005 - 1.030   pH 7.0 5.0 - 8.0   Glucose, UA NEGATIVE NEGATIVE mg/dL   Hgb urine dipstick NEGATIVE NEGATIVE   Bilirubin Urine NEGATIVE NEGATIVE   Ketones, ur NEGATIVE NEGATIVE mg/dL   Protein, ur NEGATIVE NEGATIVE mg/dL   Nitrite NEGATIVE NEGATIVE   Leukocytes,Ua SMALL (A) NEGATIVE   RBC / HPF 0-5 0 - 5 RBC/hpf   WBC, UA 6-10 0 - 5 WBC/hpf   Bacteria, UA RARE (A) NONE SEEN   Squamous Epithelial / LPF >50 (H) 0 - 5   Mucus PRESENT     MDM FHT present via doppler Cervix closed/thick U/a negative Benign abdominal exam Pain c/w round ligament pain vs constipation. Discussed maternity support belt & stool softeners  Ibuprofen 600 mg given in MAU (Pt allergic to tylenol) Assessment and Plan   1. Abdominal pain during pregnancy in second trimester   2. Constipation during pregnancy in second trimester   3. [redacted] weeks gestation of pregnancy    -take colace BID -start wearing maternity support belt -reviewed reasons to return to MAU  Judeth Horn 05/02/2020, 1:58 PM

## 2020-05-13 ENCOUNTER — Ambulatory Visit: Payer: Medicaid Other | Admitting: Neurology

## 2020-05-20 ENCOUNTER — Encounter: Payer: Self-pay | Admitting: Family Medicine

## 2020-05-20 ENCOUNTER — Telehealth (INDEPENDENT_AMBULATORY_CARE_PROVIDER_SITE_OTHER): Payer: Medicaid Other | Admitting: Family Medicine

## 2020-05-20 DIAGNOSIS — G40909 Epilepsy, unspecified, not intractable, without status epilepticus: Secondary | ICD-10-CM

## 2020-05-20 DIAGNOSIS — O99352 Diseases of the nervous system complicating pregnancy, second trimester: Secondary | ICD-10-CM

## 2020-05-20 DIAGNOSIS — E669 Obesity, unspecified: Secondary | ICD-10-CM

## 2020-05-20 DIAGNOSIS — Z3A25 25 weeks gestation of pregnancy: Secondary | ICD-10-CM

## 2020-05-20 DIAGNOSIS — O99212 Obesity complicating pregnancy, second trimester: Secondary | ICD-10-CM

## 2020-05-20 DIAGNOSIS — O99350 Diseases of the nervous system complicating pregnancy, unspecified trimester: Secondary | ICD-10-CM

## 2020-05-20 DIAGNOSIS — Z3480 Encounter for supervision of other normal pregnancy, unspecified trimester: Secondary | ICD-10-CM

## 2020-05-20 NOTE — Progress Notes (Signed)
Pt states still has not rcvd BP Cuff. Will contact [facility] to check on the status.

## 2020-05-20 NOTE — Progress Notes (Signed)
I connected with Deborah Singleton 05/20/20 at 10:55 AM EDT by: MyChart video and verified that I am speaking with the correct person using two identifiers.  Patient is located at Home and provider is located at [facility].     The purpose of this virtual visit is to provide medical care while limiting exposure to the novel coronavirus. I discussed the limitations, risks, security and privacy concerns of performing an evaluation and management service by MyChart video and the availability of in person appointments. I also discussed with the patient that there may be a patient responsible charge related to this service. By engaging in this virtual visit, you consent to the provision of healthcare.  Additionally, you authorize for your insurance to be billed for the services provided during this visit.  The patient expressed understanding and agreed to proceed.  The following staff members participated in the virtual visit:  Corinda Gubler    PRENATAL VISIT NOTE  Subjective:  Deborah Singleton is a 25 y.o. [id number] at [redacted]w[redacted]d  for phone visit for ongoing prenatal care.  She is currently monitored for the following issues for this low-risk pregnancy and has Generalized idiopathic epilepsy and epileptic syndromes, not intractable, without status epilepticus (HCC); Supervision of other normal pregnancy, antepartum; Seizure disorder in pregnancy, antepartum (HCC); Obesity during pregnancy, antepartum; and Maternal varicella, non-immune on their problem list.  Patient reports no complaints.  Contractions: Irritability. Vag. Bleeding: None.  Movement: Present. Denies leaking of fluid.   The following portions of the patient's history were reviewed and updated as appropriate: allergies, current medications, past family history, past medical history, past social history, past surgical history and problem list.   Objective:  There were no vitals filed for this visit. Self-Obtained  Fetal Status:     Movement: Present      Assessment and Plan:  Pregnancy: [id number] at [redacted]w[redacted]d 1. Seizure disorder in pregnancy, antepartum (HCC) On keppra No recent sz  2. Supervision of other normal pregnancy, antepartum Updated pregnancy box Reviewed routine care DIscussed upcoming [place] Desires BTL-- recommended signing paperwork at 28 wk visit and discuss further given permanent nature of BTS   Preterm labor symptoms and general obstetric precautions including but not limited to vaginal bleeding, contractions, leaking of fluid and fetal movement were reviewed in detail with the patient.  Return in about 4 weeks (around 06/17/2020) for Routine prenatal care, 28 wk labs.  Future Appointments  Date Time Provider Department Center  05/28/2020  1:45 PM [facility] NURSE [facility]  05/28/2020  2:00 PM [facility]  02/07/2021  3:30 PM Van Clines, MD [facility] None     Time spent on virtual visit: 10 minutes  Federico Flake, MD

## 2020-05-20 NOTE — Patient Instructions (Signed)
AREA PEDIATRIC/FAMILY PRACTICE PHYSICIANS  [place] ([address]) . [facility] o Chambliss, MD; Eniola, MD; Hale, MD; Hensel, MD; McDiarmid, MD; McIntyer, MD; Neal, MD; Walden, MD o [address] [place], [place] [address] o [phone] o Mon-Fri 8:30-12:30, 1:30-5:00 o Providers come to see babies at [facility] o Accepting Medicaid . [facility] o Limited providers who accept newborns: Koirala, MD; Morrow, MD; Wolters, MD o [address], [place], [place] [address] o [phone] o Mon-Fri 8:00-5:30 o Babies seen by providers at [facility] o Does NOT accept Medicaid o Please call early in hospitalization for appointment (limited availability)  . [facility] o Mulberry, MD o [address] [place], [place] [address] o [phone] o Mon, Tue, Thur, Fri 8:30-5:00, Wed 10:00-7:00 (closed 1-2pm) o Babies seen by [facility] providers o Accepting Medicaid . [facility] - Pediatrician o Rubin, MD o [address], [place], [place] [address] o [phone] o Mon-Fri 8:30-5:00, Sat 8:30-12:00 o Provider comes to see babies at [facility] o Accepting Medicaid o Must have been referred from current patients or contacted office prior to delivery . [facility] ([facility]) o Brown, MD; Chandler, MD; Ettefagh, MD; Grant, MD; Lester, MD; McCormick, MD; McQueen, MD; Prose, MD; Simha, MD; Stanley, MD; Stryffeler, NP; Tebben, NP o [address], [place], [place] [address] o [phone] o Mon, Tue, Thur, Fri 8:30-5:30, Wed 9:30-5:30, Sat 8:30-12:30 o Babies seen by [facility] providers o Accepting Medicaid o Only accepting infants of first-time parents or siblings of current patients o Hospital discharge coordinator will make follow-up appointment . Jack Amos o [address],  [place], [place]  [address] o [phone]   Fax - [phone] . [facility] o [address], [place], [place]  [address] o Phone - [phone]   Fax - [phone] . Shilpa Gosrani o [address], [place], [place]  [address] o [phone]  [place] ([address]) . [facility] o Bates, MD; Brassfield, MD; Cooper, Cox, MD; MD; Davis, MD; Dovico, MD; Ettefaugh, MD; Little, MD; Lowe, MD; Keiffer, MD; Melvin, MD; Sumner, MD; Williams, MD o [address], [place], [place] [address] o [phone] o Mon-Fri 8:30-5:00 (extended evenings Mon-Thur as needed), Sat-Sun 10:00-1:00 o Providers come to see babies at [facility] o Accepting Medicaid for families of first-time babies and families with all children in the household age 3 and under. Must register with office prior to making appointment (M-F only). . [facility] o Henson, NP; Knapp, MD; Lalonde, MD; Tysinger, PA o [address] [place], [place] [address] o [phone] o Mon-Fri 8:00-5:00 o Babies seen by providers at [facility] o Does NOT accept Medicaid/Commercial Insurance Only . [facility] ([facility]  o Artis, MD; Barnes, MD; Bratton, MD; Coccaro, MD; Lockett Gardner, MD; Kramer, MD; Marshall, MD; Netherton, MD; Poleto, MD; Skinner, MD o [address] [place], [place] [address] o [phone] o Mon-Fri 8:30-5:30, Sat (Oct.-Mar.) 9:00-1:00 o Babies seen by providers at [facility] o Accepting Medicaid  [place] ([address]) . [facility] o Reid, MD; Warner, MD o [address], [place],  [address] o [phone] o Mon-Fri 8:30-5:00, Sat 8:30-12:00 o Providers come to see babies at [facility] o Does NOT accept Medicaid . [facility] o Becker, PA; Hagler, MD; Scifres, PA; Sun, MD; Swayne, MD o [address],  [place], [place] [address] o [phone] o Mon-Fri 8:00-5:00 o Babies seen by providers at [facility] o Does NOT accept Medicaid o Only accepting babies of parents who are patients o Please call early in hospitalization for appointment (limited availability) . [facility] o Clark, MD; Frye, MD; Kelleher, MD; Mack, NP; Miller, MD; O'Keller, MD; Patterson, NP; Pudlo, MD; Puzio, MD; Thomas, MD; Tucker, MD; Twiselton, MD o [address], [place], [place] [address] o [phone] o Mon-Fri 8:00-5:00, Sat 9:00-12:00 o Providers come to see babies at [facility] o Does NOT accept Medicaid  [place] ([address]) . [facility] o Limited providers accepting new patients: Brake, NP; Wharton, PA o [address], [place], [place] [address] o [phone] o Mon-Fri 8:00-5:00 o Babies seen by providers at [facility] o Does NOT accept Medicaid o Only accepting babies of parents who are patients o Please call early in hospitalization for appointment (limited availability) . [facility] o Gay, MD; Quinlan, MD o [address] [place], [place] [address] o [phone] (press [phone] to schedule appointment) o Mon-Fri 8:00-5:00 o Providers come to see babies at [facility] o Does NOT accept Medicaid . [facility] o Mazer, MD o [address] [place], [place] [address] o [phone] o Mon-Fri 8:30-5:00 (lunch 12:30-1:00), extended hours by appointment only Wed 5:00-6:30 o Babies seen by [facility] providers o Accepting Medicaid . [facility] o Banks, MD; Jordan, MD; Koberlein, MD o [address], [place], [place] [address] o [phone] o Mon-Fri 8:00-5:00 o Babies seen by [facility] providers o Does NOT accept Medicaid . [facility] o Parker, MD; Hunter, MD; Wallace, DO o [address] [place], [place]  [address] o [phone] o Mon-Fri 8:00-5:00 o Babies seen by [facility] providers o Does NOT accept Medicaid . [facility] o Brandon, PA; Brecken, PA; Christy, NP; Dees, MD; DeClaire, MD; DeWeese, MD; Hansen, NP; Mills, NP; Parrish, NP; Smoot, NP; Summer, MD; Vapne, MD o [address] [place], [place] [address] o [phone] o Mon-Fri 8:30-5:00, Sat 10:00-1:00 o Providers come to see babies at [facility] o Does NOT accept Medicaid o Free prenatal information session Tuesdays at 4:45pm . [facility] o Bouska, MD; Gordon, PA; Jeffery, PA; Weber, PA o [address] [place] [address] o [phone] o Mon-Fri 7:30-5:30 o Babies seen by [facility] providers . [facility] o [address], [place], [place]  [address] o [phone]   Fax - [phone]  [place] ([address] & [address]) . [facility] o [place], [place] o [address] [place], [place] [address] o [phone] o Mon-Thur 8:00-6:00 o Providers come to see babies at [facility] o Accepting Medicaid . [facility] o Anderson, NP; Badger, MD; Beal, PA; Spencer, PA o [address] [place], [place] [address] o [phone] o Mon-Thur 7:30-7:30, Fri 7:30-4:30 o Babies seen by [facility] providers o Accepting Medicaid . [facility] o Agbuya, MD; Klett, NP; Romgoolam, MD o [address], [place], [place] [address] o [phone] o Mon-Fri 8:30-5:00, Sat 8:30-12:00 o Providers come to see babies at [facility] o Accepting Medicaid o Must have "Meet & Greet" appointment at office prior to delivery . [facility] o McCord,   MD; Wallace, MD; Wood, MD o [address], [place], [place] [address] o [phone] o Mon-Wed 8:00-6:00, Thur-Fri 8:00-5:00, Sat 9:00-12:00 o Providers come to  see babies at [facility] o Does NOT accept Medicaid o Only accepting siblings of current patients . [facility]  o [address], [place], [place]  [address] o [phone]   Fax - [phone] . [facility] o [address], [place], [place]  [address] o [phone]   Fax - [phone]  [place] ([address] & [address]) . [facility] o Cirigliano, DO; Matthews, DO o [address] [place], [place] [address] o [phone] o Mon-Fri 7:00-5:00 o Babies seen by [facility] providers o Does NOT accept Medicaid . [facility] o Briscoe, MD; Howley, PA; Moreira, PA o [address], [place], [place] [address] o [phone] o Mon-Fri 8:00-5:00 o Babies seen by [facility] providers o Accepting Medicaid . [facility] o Boyd, MD; Church, PA; Jones, NP; Osborn, PA o [address], [place], [place] [address] o [phone] o Mon-Fri 8:00-5:00 o Babies seen by providers at [facility] o Accepting Medicaid  [place] ([address]) . [facility] [place] o Wendling, DO o [address] [place], [place] [address] o [phone] o Mon-Fri 8:00-5:00 o Babies seen by [facility] providers o Does NOT accept Medicaid o Limited availability, please call early in hospitalization to schedule follow-up . [facility] o Calderon, PA; Cummings, MD; Dillard, MD; Martin, PA; Olson, MD; VanDeven, PA o [address], [place], [place] [address] o [phone] o Mon-Fri 8:30-5:00, Sat 9:00-12:00 o Babies seen by providers at [facility] o Accepting Medicaid o Please register online then schedule online or call office o [email or web address] . [facility] ([facility] o Hunter, NP; Kumar, MD; Martin Rogers, PA o [address], [place], [place] [address] o [phone] o Mon-Fri 8:00-5:00 o Babies seen by providers at [facility] o Accepting Medicaid . [facility] ([facility] o P[place], MD; Kristi Fleenor, NP; West, MD o [address], [place], [place] [address] o [phone] o Mon-Fri 8:00-5:30, Sat&Sun by appointment (phones open at 8:30) o Babies seen by [facility] providers o Accepting Medicaid o Must be a first-time baby or sibling of current patient . [facility] [place]  o [address], [place], [place]  [address] o [phone]   Fax - [phone]  [place] ([address]) . [facility] o Brown, PA; Cowen, PA; Rice, MD; Helton, PA; Spry, MD o [address] [place], [place] [address] o [phone] o Mon-Thur 8:00-7:00, Fri 8:00-5:00, Sat 8:00-12:00, Sun 9:00-12:00 o Babies seen by [facility] providers o Accepting Medicaid . [facility] o Coe-Goins, MD; [place], MD; Pierre-Louis, MD o [address], [place], [place] [address] o [phone] o Mon-Thur 8:00-5:00 o Babies seen by providers at [facility] o Accepting Medicaid . [facility] o Bratton, MD; Coe-Goins, MD; Hayes, MD; Lewis, MD; List, MD; Lott, MD; [place], MD; Moran, MD; O'Neal, MD; Pierre-Louis, MD; Pitonzo, MD; Scholer, MD; Spangle, MD o [address] [place],    [address] o [phone] o Mon-Fri 8:00-5:30, Sat (Oct.-Mar.) 9:00-1:00 o Babies seen by providers at [facility] o Accepting Medicaid o Must fill out new patient packet, available online at [email or web address] . [facility] ([facility] o Friddle, NP; Harris, NP; Kelly, NP; Logan, MD;  Melvin, PA; Poth, MD; Ramadoss, MD; Stanton, NP o [address], [place], [place] [address] o [phone] o Mon-Thur 8:00-5:30, Fri 8:00-5:00 o Babies seen by providers at [facility] o Accepting Medicaid  [place] ([address]) . [facility] o Dixon, PA; , MD; Pickard, MD; Tapia, PA o [address], [place], [place] [address] o [phone] o Mon-Fri 8:00-5:00 o Babies seen by providers at [facility] o Accepting Medicaid   [place] ([address]) . [facility] [place] o Masneri, DO; Meyers, MD; Nelson, PA o [address], [place], [place] [address] o [phone] o Mon-Fri 8:00-5:00 o Babies seen by providers at [facility] o Does NOT accept Medicaid o Limited appointment availability, please call early in hospitalization  . [facility] [place] o Kunedd, DO; McGowen, MD o [address], [place], [place] [address] o [phone] o Mon-Fri 8:00-5:00 o Babies seen by [facility] providers o Does NOT accept Medicaid . [facility] [place] o Cameron, MD; MacDonald, MD; Michaels, PA; Nayak, MD o [address] [place] [address], [place], [place] [address] o [phone] o Mon-Fri 8:00-5:00 o After hours clinic ([address] [place], [place] [address]) [phone] Mon-Fri 5:00-8:00, Sat 12:00-6:00, Sun 10:00-4:00 o Babies seen by [facility] providers o Accepting Medicaid . [facility] [place] o [address], [place], [place]  [address] o [phone]   Fax - [phone]  [place] ([address]) . [facility] [place] [facility] o Andy, MD o [address], [place], [place] [address] o [phone] o Mon-Fri 8:00-5:00 o Babies seen by [facility] providers o Does NOT accept Medicaid . [facility] [place] ([facility] [place]) o Eksir, MD o [address], [place], [place]  [address] o [phone] o Mon-Thur 8:00-7:00, Fri 8:00-5:00, Sat 8:00-12:00 o Babies seen by providers at [facility] o Accepting Medicaid - but does not have vaccinations in office (must be received elsewhere) o Limited availability, please call early in hospitalization  [place] ([address]) . [facility]  o Charlene Flemming, MD o [address],  [place] [address] o [phone]  Fax [phone]   

## 2020-05-28 ENCOUNTER — Other Ambulatory Visit: Payer: Self-pay

## 2020-05-28 ENCOUNTER — Encounter: Payer: Self-pay | Admitting: *Deleted

## 2020-05-28 ENCOUNTER — Ambulatory Visit: Payer: Medicaid Other | Attending: Obstetrics

## 2020-05-28 ENCOUNTER — Ambulatory Visit: Payer: Medicaid Other | Admitting: *Deleted

## 2020-05-28 ENCOUNTER — Other Ambulatory Visit: Payer: Self-pay | Admitting: *Deleted

## 2020-05-28 DIAGNOSIS — O99212 Obesity complicating pregnancy, second trimester: Secondary | ICD-10-CM | POA: Diagnosis not present

## 2020-05-28 DIAGNOSIS — Z3A26 26 weeks gestation of pregnancy: Secondary | ICD-10-CM | POA: Diagnosis not present

## 2020-05-28 DIAGNOSIS — O99350 Diseases of the nervous system complicating pregnancy, unspecified trimester: Secondary | ICD-10-CM

## 2020-05-28 DIAGNOSIS — O99352 Diseases of the nervous system complicating pregnancy, second trimester: Secondary | ICD-10-CM

## 2020-05-28 DIAGNOSIS — Z6841 Body Mass Index (BMI) 40.0 and over, adult: Secondary | ICD-10-CM

## 2020-05-28 DIAGNOSIS — G40909 Epilepsy, unspecified, not intractable, without status epilepticus: Secondary | ICD-10-CM

## 2020-06-14 ENCOUNTER — Other Ambulatory Visit: Payer: Self-pay

## 2020-06-14 ENCOUNTER — Inpatient Hospital Stay (HOSPITAL_COMMUNITY)
Admission: AD | Admit: 2020-06-14 | Discharge: 2020-06-14 | Disposition: A | Discharge status: Home / Self Care | Payer: Medicaid Other | Attending: Family Medicine | Admitting: Family Medicine

## 2020-06-14 ENCOUNTER — Encounter (HOSPITAL_COMMUNITY): Payer: Self-pay | Admitting: Obstetrics & Gynecology

## 2020-06-14 DIAGNOSIS — O99350 Diseases of the nervous system complicating pregnancy, unspecified trimester: Secondary | ICD-10-CM

## 2020-06-14 DIAGNOSIS — G40909 Epilepsy, unspecified, not intractable, without status epilepticus: Secondary | ICD-10-CM

## 2020-06-14 DIAGNOSIS — Z7982 Long term (current) use of aspirin: Secondary | ICD-10-CM | POA: Diagnosis not present

## 2020-06-14 DIAGNOSIS — R109 Unspecified abdominal pain: Secondary | ICD-10-CM | POA: Diagnosis present

## 2020-06-14 DIAGNOSIS — Z0371 Encounter for suspected problem with amniotic cavity and membrane ruled out: Secondary | ICD-10-CM

## 2020-06-14 DIAGNOSIS — Z3A28 28 weeks gestation of pregnancy: Secondary | ICD-10-CM | POA: Insufficient documentation

## 2020-06-14 DIAGNOSIS — O26893 Other specified pregnancy related conditions, third trimester: Secondary | ICD-10-CM | POA: Insufficient documentation

## 2020-06-14 LAB — AMNISURE RUPTURE OF MEMBRANE (ROM) NOT AT ARMC: Amnisure ROM: NEGATIVE

## 2020-06-14 MED ORDER — CYCLOBENZAPRINE HCL 10 MG PO TABS: 10.0000 mg | ORAL_TABLET | Freq: Two times a day (BID) | ORAL | 0 refills | Status: DC | PRN

## 2020-06-14 MED ORDER — CYCLOBENZAPRINE HCL 5 MG PO TABS
10.0000 mg | ORAL_TABLET | Freq: Once | ORAL | Status: AC
  Administered 2020-06-14: 10 mg via ORAL
  Filled 2020-06-14: qty 2

## 2020-06-14 NOTE — MAU Provider Note (Signed)
History     CSN: [id number]  Arrival date and time: 06/14/20 1706   Event Date/Time   First Provider Initiated Contact with Patient 06/14/20 1908      Chief Complaint  Patient presents with  . Abdominal Pain  . Vaginal Discharge  . Rupture of Membranes   HPI Deborah Singleton is a 25 y.o. [id number] at [redacted]w[redacted]d who presents to MAU with chief complaint of abdominal pain. This is a new problem, onset 2-3 days ago. Patient endorses generalized pain on the left side of her abdomen. Pain score is 7/10. She denies aggravating or alleviating factors. She has not taken medication or tried other treatments for this complaint.  Patient also endorses two episodes of gushing fluid within the past 24 hours. Fluid was clear.   Patient also endorses pain in her lower back. Pain score is also 7/10, pain does not radiate. She has not taken medication or tried other treatments for this complaint.  She denies vaginal bleeding, leaking of fluid, decreased fetal movement, fever, falls, or recent illness. She is remote from sexual intercourse. She denies constipation. Patient receives care with [facility].   OB History    Gravida  4   Para  2   Term  2   Preterm      AB  1   Living  2     SAB      IAB  1   Ectopic      Multiple      Live Births  2           Past Medical History:  Diagnosis Date  . Miscarriage 06/02/2019  . Seizures (HCC)    last seizure Aug 2021    Past Surgical History:  Procedure Laterality Date  . TOOTH EXTRACTION      Family History  Problem Relation Age of Onset  . Asthma Mother   . Seizures Mother   . Depression Sister   . Diabetes Sister        younger sister  . Diabetes Father     Social History   Tobacco Use  . Smoking status: Never Smoker  . Smokeless tobacco: Never Used  Vaping Use  . Vaping Use: Never used  Substance Use Topics  . Alcohol use: Not Currently  . Drug use: No    Allergies:  Allergies  Allergen Reactions  .  Acetaminophen Itching    Medications Prior to Admission  Medication Sig Dispense Refill Last Dose  . aspirin EC 81 MG tablet Take 1 tablet (81 mg total) by mouth daily. Take after 12 weeks for prevention of preeclampsia later in pregnancy 300 tablet 0 06/13/2020 at Unknown time  . folic acid (FOLVITE) 1 MG tablet Take 4 tablets (4mg ) daily 120 tablet 11 06/13/2020 at Unknown time  . levETIRAcetam (KEPPRA) 500 MG tablet Take 1/2 tablet twice a day for 3 days, then increase to 1 tablet twice a day 60 tablet 11 06/13/2020 at Unknown time  . magnesium oxide (MAG-OX) 400 (241.3 Mg) MG tablet Take 1 tablet (400 mg total) by mouth daily. 30 tablet 11 06/13/2020 at Unknown time  . Prenatal Vit-Fe Fumarate-FA (PRENATAL MULTIVITAMIN) TABS tablet Take 1 tablet by mouth daily at 12 noon.   06/13/2020 at Unknown time  . Blood Pressure Monitoring DEVI 1 each by Does not apply route once a week. (Patient not taking: No sig reported) 1 each 0     Review of Systems  Gastrointestinal: Positive for abdominal pain.  All  other systems reviewed and are negative.  Physical Exam   Blood pressure 116/60, pulse 96, temperature 98.1 F (36.7 C), temperature source Oral, resp. rate 15, height 5' (1.524 m), weight 108.3 kg, last menstrual period 11/06/2019, SpO2 98 %.  Physical Exam Vitals and nursing note reviewed. Exam conducted with a chaperone present.  Constitutional:      Appearance: She is well-developed. She is obese.  Cardiovascular:     Rate and Rhythm: Normal rate.     Heart sounds: Normal heart sounds.  Pulmonary:     Effort: Pulmonary effort is normal.     Breath sounds: Normal breath sounds.  Abdominal:     Palpations: Abdomen is soft.     Tenderness: There is no abdominal tenderness. There is no right CVA tenderness or left CVA tenderness.       Comments: Gravid  Genitourinary:    Comments: Pelvic exam: External genitalia normal, vaginal walls pink and well rugated, cervix visually closed, no  lesions noted.  Skin:    General: Skin is warm.     Capillary Refill: Capillary refill takes less than 2 seconds.  Neurological:     Mental Status: She is alert and oriented to person, place, and time.     MAU Course  Procedures: sterile speculum exam  --Reactive tracing: baseline 140, mod var, + accels, no decels --Toco: quiet --Negative pooling, negative fern, negative Amnisure --Tylenol canceled due to patient report of allergy (itching)  Patient Vitals for the past 24 hrs:  BP Temp Temp src Pulse Resp SpO2 Height Weight  06/14/20 1855 116/60 -- -- 96 15 98 % -- --  06/14/20 1745 -- -- -- -- -- 99 % -- --  06/14/20 1740 -- -- -- -- -- 98 % -- --  06/14/20 1735 (!) 141/65 -- -- (!) 110 -- 98 % -- --  06/14/20 1717 137/75 98.1 F (36.7 C) Oral (!) 105 20 98 % 5' (1.524 m) 108.3 kg   Results for orders placed or performed during the hospital encounter of 06/14/20 (from the past 24 hour(s))  Amnisure rupture of membrane (rom)not at [facility]     Status: None   Collection Time: 06/14/20  5:51 PM  Result Value Ref Range   Amnisure ROM NEGATIVE    Meds ordered this encounter  Medications  . cyclobenzaprine (FLEXERIL) tablet 10 mg  . cyclobenzaprine (FLEXERIL) 10 MG tablet    Sig: Take 1 tablet (10 mg total) by mouth 2 (two) times daily as needed for muscle spasms.    Dispense:  20 tablet    Refill:  0    Order Specific Question:   Supervising Provider    Answer:   Levie Heritage [[id number]]   Assessment and Plan  --25 y.o. [id number] at [redacted]w[redacted]d  --Reactive tracing --Intact amniotic sac --Closed cervix --Pain resolved with Flexeril, outpatient rx --Advised maternity belt --Elevated BP x 1 without history of HTN --Discharge home in stable condition  Calvert Cantor, CNM 06/14/2020, 7:53 PM

## 2020-06-14 NOTE — MAU Note (Signed)
Throbbing pain in LUQ, constant pain.  First started a couple days ago.  Clear liquid d/c, first noted at 2300 last night, saw again today. Not wearing a pad. No bleeding.

## 2020-06-14 NOTE — Discharge Instructions (Signed)
Abdominal Pain During Pregnancy Abdominal pain is common during pregnancy and has many possible causes. Some causes are more serious than others, and sometimes the cause is not known. Abdominal pain can be a sign that labor is starting. It can also be caused by normal growth of your baby causing stretching of muscles and ligaments during pregnancy. Always tell your health care provider if you have any abdominal pain. Follow these instructions at home:  Do not have sex or put anything in your vagina until your pain goes away completely.  Get plenty of rest until your pain improves.  Drink enough fluid to keep your urine pale yellow.  Take over-the-counter and prescription medicines only as told by your health care provider.  Keep all follow-up visits. This is important.   Contact a health care provider if:  Your pain continues or gets worse after resting.  You have lower abdominal pain that: ? Comes and goes at regular intervals. ? Spreads to your back. ? Is similar to menstrual cramps.  You have pain or burning when you urinate. Get help right away if:  You have a fever, chills, or shortness of breath.  You have vaginal bleeding.  You are leaking fluid or passing tissue from your vagina.  You have vomiting or diarrhea that lasts for more than 24 hours.  Your baby is moving less than usual.  You feel very weak or faint.  You develop severe pain in your upper abdomen. Summary  Abdominal pain is common during pregnancy and has many possible causes.  If you experience abdominal pain during pregnancy, tell your health care provider right away.  Follow your health care provider's home care instructions and keep all follow-up visits as told. This information is not intended to replace advice given to you by your health care provider. Make sure you discuss any questions you have with your health care provider. Document Revised: 09/30/2019 Document Reviewed: 09/30/2019 Elsevier  Patient Education  2021 [employer].  

## 2020-06-21 ENCOUNTER — Other Ambulatory Visit: Payer: Self-pay | Admitting: General Practice

## 2020-06-21 DIAGNOSIS — Z3480 Encounter for supervision of other normal pregnancy, unspecified trimester: Secondary | ICD-10-CM

## 2020-06-22 ENCOUNTER — Ambulatory Visit (INDEPENDENT_AMBULATORY_CARE_PROVIDER_SITE_OTHER): Payer: Medicaid Other | Admitting: Obstetrics and Gynecology

## 2020-06-22 ENCOUNTER — Other Ambulatory Visit: Payer: Medicaid Other

## 2020-06-22 ENCOUNTER — Other Ambulatory Visit: Payer: Self-pay

## 2020-06-22 VITALS — BP 114/67 | HR 96 | Wt 237.0 lb

## 2020-06-22 DIAGNOSIS — O99350 Diseases of the nervous system complicating pregnancy, unspecified trimester: Secondary | ICD-10-CM

## 2020-06-22 DIAGNOSIS — Z3A29 29 weeks gestation of pregnancy: Secondary | ICD-10-CM

## 2020-06-22 DIAGNOSIS — Z3480 Encounter for supervision of other normal pregnancy, unspecified trimester: Secondary | ICD-10-CM

## 2020-06-22 DIAGNOSIS — G40909 Epilepsy, unspecified, not intractable, without status epilepticus: Secondary | ICD-10-CM

## 2020-06-22 DIAGNOSIS — Z3009 Encounter for other general counseling and advice on contraception: Secondary | ICD-10-CM

## 2020-06-22 MED ORDER — ONDANSETRON 4 MG PO TBDP: 4.0000 mg | ORAL_TABLET | Freq: Four times a day (QID) | ORAL | 0 refills | Status: DC | PRN

## 2020-06-22 NOTE — Progress Notes (Signed)
   PRENATAL VISIT NOTE  Subjective:  Deborah Singleton is a 25 y.o. [id number] at [redacted]w[redacted]d being seen today for ongoing prenatal care.  She is currently monitored for the following issues for this high-risk pregnancy and has Generalized idiopathic epilepsy and epileptic syndromes, not intractable, without status epilepticus (HCC); Supervision of other normal pregnancy, antepartum; Seizure disorder in pregnancy, antepartum (HCC); Obesity during pregnancy, antepartum; and Maternal varicella, non-immune on their problem list.  Patient reports no complaints.  Contractions: Irritability. Vag. Bleeding: None.  Movement: Present. Denies leaking of fluid.   Vomited immediately after taking glucola.   The following portions of the patient's history were reviewed and updated as appropriate: allergies, current medications, past family history, past medical history, past social history, past surgical history and problem list.   Objective:   Vitals:   06/22/20 0855  BP: 114/67  Pulse: 96  Weight: 237 lb (107.5 kg)    Fetal Status: Fetal Heart Rate (bpm): 140   Movement: Present     General:  Alert, oriented and cooperative. Patient is in no acute distress.  Skin: Skin is warm and dry. No rash noted.   Cardiovascular: Normal heart rate noted  Respiratory: Normal respiratory effort, no problems with respiration noted  Abdomen: Soft, gravid, appropriate for gestational age.  Pain/Pressure: Present     Pelvic: Cervical exam deferred        Extremities: Normal range of motion.  Edema: Trace  Mental Status: Normal mood and affect. Normal behavior. Normal judgment and thought content.   Assessment and Plan:  Pregnancy: [id number] at [redacted]w[redacted]d 1. Supervision of other normal pregnancy, antepartum - HIV Antibody (routine testing w rflx) - RPR - CBC - Glucose Tolerance, 2 Hours w/1 Hour -will have patient return for reattempt at glucola. Script sent for zofran prior to drinking the juice.   2. Seizure disorder in  pregnancy, antepartum (HCC) -on keppra, last seizure was last year - tonic-clonic seizure; no seizures during pregnancy.   3. [redacted] weeks gestation of pregnancy   4. Unwanted fertility Patient desires permanent sterilization.  Other reversible forms of contraception were discussed with patient; she declines all other modalities. Risks of procedure discussed with patient including but not limited to: risk of regret, permanence of method, bleeding, infection, injury to surrounding organs and need for additional procedures.  Failure risk of about 1% with increased risk of ectopic gestation if pregnancy occurs was also discussed with patient.  Also discussed possibility of post-tubal pain syndrome. Patient verbalized understanding of these risks and wants to proceed with sterilization.  Papers signed today.    Preterm labor symptoms and general obstetric precautions including but not limited to vaginal bleeding, contractions, leaking of fluid and fetal movement were reviewed in detail with the patient. Please refer to After Visit Summary for other counseling recommendations.   Return in about 2 weeks (around 07/06/2020) for OB.  Future Appointments  Date Time Provider Department Center  06/25/2020 10:30 AM [facility] NURSE [facility]  06/25/2020 10:45 AM [facility]  02/07/2021  3:30 PM Van Clines, MD [facility] None    Gita Kudo, MD

## 2020-06-23 LAB — CBC
Hematocrit: 33.4 % — ABNORMAL LOW (ref 34.0–46.6)
Hemoglobin: 10.5 g/dL — ABNORMAL LOW (ref 11.1–15.9)
MCH: 24.3 pg — ABNORMAL LOW (ref 26.6–33.0)
MCHC: 31.4 g/dL — ABNORMAL LOW (ref 31.5–35.7)
MCV: 77 fL — ABNORMAL LOW (ref 79–97)
Platelets: 280 10*3/uL (ref 150–450)
RBC: 4.32 x10E6/uL (ref 3.77–5.28)
RDW: 13.9 % (ref 11.7–15.4)
WBC: 8.8 10*3/uL (ref 3.4–10.8)

## 2020-06-23 LAB — HIV ANTIBODY (ROUTINE TESTING W REFLEX): HIV Screen 4th Generation wRfx: NONREACTIVE

## 2020-06-23 LAB — RPR: RPR Ser Ql: NONREACTIVE

## 2020-06-25 ENCOUNTER — Ambulatory Visit: Payer: Medicaid Other | Admitting: *Deleted

## 2020-06-25 ENCOUNTER — Ambulatory Visit: Payer: Medicaid Other | Attending: Maternal & Fetal Medicine

## 2020-06-25 ENCOUNTER — Other Ambulatory Visit: Payer: Self-pay

## 2020-06-25 ENCOUNTER — Encounter: Payer: Self-pay | Admitting: *Deleted

## 2020-06-25 DIAGNOSIS — O99213 Obesity complicating pregnancy, third trimester: Secondary | ICD-10-CM | POA: Insufficient documentation

## 2020-06-25 DIAGNOSIS — O99350 Diseases of the nervous system complicating pregnancy, unspecified trimester: Secondary | ICD-10-CM

## 2020-06-25 DIAGNOSIS — G40909 Epilepsy, unspecified, not intractable, without status epilepticus: Secondary | ICD-10-CM | POA: Diagnosis not present

## 2020-06-25 DIAGNOSIS — Z6841 Body Mass Index (BMI) 40.0 and over, adult: Secondary | ICD-10-CM

## 2020-06-25 DIAGNOSIS — O99353 Diseases of the nervous system complicating pregnancy, third trimester: Secondary | ICD-10-CM | POA: Diagnosis not present

## 2020-06-25 DIAGNOSIS — O99212 Obesity complicating pregnancy, second trimester: Secondary | ICD-10-CM | POA: Diagnosis not present

## 2020-06-25 DIAGNOSIS — Z3A30 30 weeks gestation of pregnancy: Secondary | ICD-10-CM | POA: Diagnosis not present

## 2020-06-25 DIAGNOSIS — Z362 Encounter for other antenatal screening follow-up: Secondary | ICD-10-CM | POA: Diagnosis not present

## 2020-06-29 ENCOUNTER — Other Ambulatory Visit: Payer: Self-pay | Admitting: *Deleted

## 2020-06-29 DIAGNOSIS — Z6841 Body Mass Index (BMI) 40.0 and over, adult: Secondary | ICD-10-CM

## 2020-07-07 ENCOUNTER — Encounter (HOSPITAL_COMMUNITY): Payer: Self-pay | Admitting: Obstetrics and Gynecology

## 2020-07-07 ENCOUNTER — Other Ambulatory Visit: Payer: Self-pay | Admitting: Student

## 2020-07-07 ENCOUNTER — Other Ambulatory Visit: Payer: Self-pay

## 2020-07-07 ENCOUNTER — Inpatient Hospital Stay (HOSPITAL_COMMUNITY)
Admission: AD | Admit: 2020-07-07 | Discharge: 2020-07-07 | Disposition: A | Discharge status: Home / Self Care | Payer: Medicaid Other | Attending: Obstetrics and Gynecology | Admitting: Obstetrics and Gynecology

## 2020-07-07 ENCOUNTER — Encounter: Payer: Medicaid Other | Admitting: Obstetrics and Gynecology

## 2020-07-07 DIAGNOSIS — O26893 Other specified pregnancy related conditions, third trimester: Secondary | ICD-10-CM | POA: Diagnosis not present

## 2020-07-07 DIAGNOSIS — M545 Low back pain, unspecified: Secondary | ICD-10-CM | POA: Insufficient documentation

## 2020-07-07 DIAGNOSIS — Z3A31 31 weeks gestation of pregnancy: Secondary | ICD-10-CM | POA: Insufficient documentation

## 2020-07-07 DIAGNOSIS — O99353 Diseases of the nervous system complicating pregnancy, third trimester: Secondary | ICD-10-CM | POA: Diagnosis not present

## 2020-07-07 DIAGNOSIS — Z7982 Long term (current) use of aspirin: Secondary | ICD-10-CM | POA: Insufficient documentation

## 2020-07-07 DIAGNOSIS — G40909 Epilepsy, unspecified, not intractable, without status epilepticus: Secondary | ICD-10-CM | POA: Insufficient documentation

## 2020-07-07 DIAGNOSIS — O99350 Diseases of the nervous system complicating pregnancy, unspecified trimester: Secondary | ICD-10-CM

## 2020-07-07 LAB — URINALYSIS, ROUTINE W REFLEX MICROSCOPIC
Bilirubin Urine: NEGATIVE
Glucose, UA: NEGATIVE mg/dL
Hgb urine dipstick: NEGATIVE
Ketones, ur: NEGATIVE mg/dL
Nitrite: NEGATIVE
Protein, ur: NEGATIVE mg/dL
Specific Gravity, Urine: 1.019 (ref 1.005–1.030)
pH: 6 (ref 5.0–8.0)

## 2020-07-07 LAB — WET PREP, GENITAL
Clue Cells Wet Prep HPF POC: NONE SEEN
Sperm: NONE SEEN
Trich, Wet Prep: NONE SEEN
Yeast Wet Prep HPF POC: NONE SEEN

## 2020-07-07 MED ORDER — CYCLOBENZAPRINE HCL 10 MG PO TABS: 10.0000 mg | ORAL_TABLET | Freq: Two times a day (BID) | ORAL | 0 refills | Status: DC | PRN

## 2020-07-07 MED ORDER — CYCLOBENZAPRINE HCL 5 MG PO TABS
10.0000 mg | ORAL_TABLET | Freq: Once | ORAL | Status: AC
  Administered 2020-07-07: 10 mg via ORAL
  Filled 2020-07-07: qty 2

## 2020-07-07 MED ORDER — CYCLOBENZAPRINE HCL 10 MG PO TABS: 10.0000 mg | ORAL_TABLET | Freq: Two times a day (BID) | ORAL | 1 refills | Status: DC | PRN

## 2020-07-07 NOTE — Discharge Instructions (Signed)
Back Pain in Pregnancy Back pain during pregnancy is common. Back pain may be caused by several factors that are related to changes during your pregnancy. Follow these instructions at home: Managing pain, stiffness, and swelling  If directed, for sudden (acute) back pain, put ice on the painful area. ? Put ice in a plastic bag. ? Place a towel between your skin and the bag. ? Leave the ice on for 20 minutes, 2-3 times per day.  If directed, apply heat to the affected area before you exercise. Use the heat source that your health care provider recommends, such as a moist heat pack or a heating pad. ? Place a towel between your skin and the heat source. ? Leave the heat on for 20-30 minutes. ? Remove the heat if your skin turns bright red. This is especially important if you are unable to feel pain, heat, or cold. You may have a greater risk of getting burned.  If directed, massage the affected area.      Activity  Exercise as told by your health care provider. Gentle exercise is the best way to prevent or manage back pain.  Listen to your body when lifting. If lifting hurts, ask for help or bend your knees. This uses your leg muscles instead of your back muscles.  Squat down when picking up something from the floor. Do not bend over.  Only use bed rest for short periods as told by your health care provider. Bed rest should only be used for the most severe episodes of back pain. Standing, sitting, and lying down  Do not stand in one place for long periods of time.  Use good posture when sitting. Make sure your head rests over your shoulders and is not hanging forward. Use a pillow on your lower back if necessary.  Try sleeping on your side, preferably the left side, with a pregnancy support pillow or 1-2 regular pillows between your legs. ? If you have back pain after a night's rest, your bed may be too soft. ? A firm mattress may provide more support for your back during  pregnancy. General instructions  Do not wear high heels.  Eat a healthy diet. Try to gain weight within your health care provider's recommendations.  Use a maternity girdle, elastic sling, or back brace as told by your health care provider.  Take over-the-counter and prescription medicines only as told by your health care provider.  Work with a physical therapist or massage therapist to find ways to manage back pain. Acupuncture or massage therapy may be helpful.  Keep all follow-up visits as told by your health care provider. This is important. Contact a health care provider if:  Your back pain interferes with your daily activities.  You have increasing pain in other parts of your body. Get help right away if:  You develop numbness, tingling, weakness, or problems with the use of your arms or legs.  You develop severe back pain that is not controlled with medicine.  You have a change in bowel or bladder control.  You develop shortness of breath, dizziness, or you faint.  You develop nausea, vomiting, or sweating.  You have back pain that is a rhythmic, cramping pain similar to labor pains. Labor pain is usually 1-2 minutes apart, lasts for about 1 minute, and involves a bearing down feeling or pressure in your pelvis.  You have back pain and your water breaks or you have vaginal bleeding.  You have back pain or   numbness that travels down your leg.  Your back pain developed after you fell.  You develop pain on one side of your back.  You see blood in your urine.  You develop skin blisters in the area of your back pain. Summary  Back pain may be caused by several factors that are related to changes during your pregnancy.  Follow instructions as told by your health care provider for managing pain, stiffness, and swelling.  Exercise as told by your health care provider. Gentle exercise is the best way to prevent or manage back pain.  Take over-the-counter and  prescription medicines only as told by your health care provider.  Keep all follow-up visits as told by your health care provider. This is important. This information is not intended to replace advice given to you by your health care provider. Make sure you discuss any questions you have with your health care provider. Document Revised: 05/07/2018 Document Reviewed: 07/04/2017 Elsevier Patient Education  2021 [employer] Inc[employer]  

## 2020-07-07 NOTE — MAU Provider Note (Signed)
Patient Deborah Singleton is a 25 y.o. [phone]  at [redacted]w[redacted]d here with complaints of back pain in the lumbar region and on her right side. She denies NV, diarrhea, constipation, LOF, vaginal bleeding, decreased fetal movements. She denies abnormal vaginal discharge, dysuria, other complaints.   She denies any complications in this pregnancy; no h/o of C/section, DM, HTN. She has a history of one pre-term delivery at 37 weeks and one 38 week delivery.   History     CSN: [id number]  Arrival date and time: 07/07/20 1221   Event Date/Time   First Provider Initiated Contact with Patient 07/07/20 1309      Chief Complaint  Patient presents with  . Back Pain   Abdominal Pain This is a new problem. The current episode started today. The problem occurs intermittently. The pain is located in the RLQ and RUQ. The pain is at a severity of 6/10. The abdominal pain does not radiate. Pertinent negatives include no diarrhea, dysuria or nausea.  Back Pain This is a new problem. The current episode started today. The problem occurs intermittently. The pain is present in the lumbar spine. The quality of the pain is described as aching. The pain is at a severity of 6/10. Pertinent negatives include no abdominal pain or dysuria.    OB History    Gravida  4   Para  2   Term  1   Preterm  1   AB  1   Living  2     SAB      IAB  1   Ectopic      Multiple      Live Births  2           Past Medical History:  Diagnosis Date  . Miscarriage 06/02/2019  . Seizures (HCC)    last seizure Aug 2021    Past Surgical History:  Procedure Laterality Date  . TOOTH EXTRACTION      Family History  Problem Relation Age of Onset  . Asthma Mother   . Seizures Mother   . Depression Sister   . Diabetes Sister        younger sister  . Diabetes Father     Social History   Tobacco Use  . Smoking status: Never Smoker  . Smokeless tobacco: Never Used  Vaping Use  . Vaping Use: Never used   Substance Use Topics  . Alcohol use: Not Currently  . Drug use: No    Allergies:  Allergies  Allergen Reactions  . Acetaminophen Itching    Medications Prior to Admission  Medication Sig Dispense Refill Last Dose  . aspirin EC 81 MG tablet Take 1 tablet (81 mg total) by mouth daily. Take after 12 weeks for prevention of preeclampsia later in pregnancy 300 tablet 0   . Blood Pressure Monitoring DEVI 1 each by Does not apply route once a week. 1 each 0   . cyclobenzaprine (FLEXERIL) 10 MG tablet Take 1 tablet (10 mg total) by mouth 2 (two) times daily as needed for muscle spasms. 20 tablet 0   . folic acid (FOLVITE) 1 MG tablet Take 4 tablets (4mg ) daily 120 tablet 11   . levETIRAcetam (KEPPRA) 500 MG tablet Take 1/2 tablet twice a day for 3 days, then increase to 1 tablet twice a day 60 tablet 11   . magnesium oxide (MAG-OX) 400 (241.3 Mg) MG tablet Take 1 tablet (400 mg total) by mouth daily. 30 tablet 11   .  ondansetron (ZOFRAN ODT) 4 MG disintegrating tablet Take 1 tablet (4 mg total) by mouth every 6 (six) hours as needed for nausea. 20 tablet 0   . Prenatal Vit-Fe Fumarate-FA (PRENATAL MULTIVITAMIN) TABS tablet Take 1 tablet by mouth daily at 12 noon.       Review of Systems  Constitutional: Negative.   HENT: Negative.   Respiratory: Negative.   Cardiovascular: Negative.   Gastrointestinal: Negative for abdominal pain, diarrhea and nausea.       Reports pain on her right side, on her hip.   Genitourinary: Negative.  Negative for dysuria, vaginal bleeding, vaginal discharge and vaginal pain.  Musculoskeletal: Positive for back pain.   Physical Exam   Blood pressure 117/62, pulse (!) 105, temperature 97.8 F (36.6 C), temperature source Oral, resp. rate 16, height 5' (1.524 m), weight 108.5 kg, last menstrual period 11/06/2019, SpO2 100 %.  Physical Exam Constitutional:      Appearance: Normal appearance.  Pulmonary:     Effort: Pulmonary effort is normal.  Abdominal:      General: Abdomen is flat. There is no distension.     Tenderness: There is no abdominal tenderness.  Genitourinary:    General: Normal vulva.  Skin:    General: Skin is warm and dry.  Neurological:     Mental Status: She is alert.   NO CVA tenderness, no abdominal tenderness.   MAU Course  Procedures  MDM -NST: 135 bpm,  Mod var, present acel, no decels, no contractions  -wet prep: negative Cervix is closed, long, thick, ffn collected but not sent -UA is normal, no signs of UTI -Flexeril brought pain to a 0/10 -Patient declines repeat cervical exam Assessment and Plan   1. Acute midline low back pain without sciatica   2. Seizure disorder in pregnancy, antepartum (HCC)   -GC pending -RX given for flexeril  -keep follow up on the 23rd of June -will place order for Physical Therapy   Charlesetta Garibaldi [facility] 07/07/2020, 3:27 PM

## 2020-07-07 NOTE — MAU Note (Signed)
Deborah Singleton is a 25 y.o. at [redacted]w[redacted]d here in MAU reporting: contractions that started this morning. This morning also thinks her mucus plug came out. States she could not stop peeing when she tried so she is unsure about LOF. No bleeding. +FM  Onset of complaint: today  Pain score: 7/10  Vitals:   07/07/20 1246  BP: 124/71  Pulse: (!) 114  Resp: 16  Temp: 97.8 F (36.6 C)  SpO2: 100%     FHT: 142  Lab orders placed from triage: UA

## 2020-07-08 LAB — GC/CHLAMYDIA PROBE AMP (~~LOC~~) NOT AT ARMC
Chlamydia: NEGATIVE
Comment: NEGATIVE
Comment: NORMAL
Neisseria Gonorrhea: NEGATIVE

## 2020-07-22 ENCOUNTER — Other Ambulatory Visit: Payer: Self-pay | Admitting: *Deleted

## 2020-07-22 ENCOUNTER — Encounter: Payer: Self-pay | Admitting: *Deleted

## 2020-07-22 ENCOUNTER — Ambulatory Visit: Payer: Medicaid Other | Attending: Obstetrics and Gynecology

## 2020-07-22 ENCOUNTER — Other Ambulatory Visit: Payer: Self-pay

## 2020-07-22 ENCOUNTER — Ambulatory Visit: Payer: Medicaid Other | Admitting: *Deleted

## 2020-07-22 VITALS — BP 114/73 | HR 94

## 2020-07-22 DIAGNOSIS — Z3A34 34 weeks gestation of pregnancy: Secondary | ICD-10-CM | POA: Diagnosis not present

## 2020-07-22 DIAGNOSIS — O99213 Obesity complicating pregnancy, third trimester: Secondary | ICD-10-CM

## 2020-07-22 DIAGNOSIS — G40909 Epilepsy, unspecified, not intractable, without status epilepticus: Secondary | ICD-10-CM

## 2020-07-22 DIAGNOSIS — O99350 Diseases of the nervous system complicating pregnancy, unspecified trimester: Secondary | ICD-10-CM | POA: Insufficient documentation

## 2020-07-22 DIAGNOSIS — O99353 Diseases of the nervous system complicating pregnancy, third trimester: Secondary | ICD-10-CM | POA: Diagnosis not present

## 2020-07-22 DIAGNOSIS — O99212 Obesity complicating pregnancy, second trimester: Secondary | ICD-10-CM | POA: Insufficient documentation

## 2020-07-22 DIAGNOSIS — E669 Obesity, unspecified: Secondary | ICD-10-CM

## 2020-07-22 DIAGNOSIS — Z6841 Body Mass Index (BMI) 40.0 and over, adult: Secondary | ICD-10-CM | POA: Diagnosis present

## 2020-07-26 ENCOUNTER — Inpatient Hospital Stay (HOSPITAL_COMMUNITY)
Admission: AD | Admit: 2020-07-26 | Discharge: 2020-07-26 | Disposition: A | Discharge status: Home / Self Care | Payer: Medicaid Other | Attending: Obstetrics and Gynecology | Admitting: Obstetrics and Gynecology

## 2020-07-26 ENCOUNTER — Encounter (HOSPITAL_COMMUNITY): Payer: Self-pay | Admitting: Obstetrics and Gynecology

## 2020-07-26 DIAGNOSIS — Z3A34 34 weeks gestation of pregnancy: Secondary | ICD-10-CM | POA: Diagnosis not present

## 2020-07-26 DIAGNOSIS — Z0371 Encounter for suspected problem with amniotic cavity and membrane ruled out: Secondary | ICD-10-CM | POA: Insufficient documentation

## 2020-07-26 DIAGNOSIS — Z79899 Other long term (current) drug therapy: Secondary | ICD-10-CM | POA: Diagnosis not present

## 2020-07-26 DIAGNOSIS — G40909 Epilepsy, unspecified, not intractable, without status epilepticus: Secondary | ICD-10-CM

## 2020-07-26 LAB — POCT FERN TEST: POCT Fern Test: NEGATIVE

## 2020-07-26 LAB — AMNISURE RUPTURE OF MEMBRANE (ROM) NOT AT ARMC: Amnisure ROM: NEGATIVE

## 2020-07-26 NOTE — MAU Provider Note (Signed)
None     Chief Complaint:  Rupture of Membranes   Deborah Singleton is  25 y.o. [id number] at 100w4d presents complaining of Rupture of Membranes .  She was bouncing on a birth ball, went to the bathroom to void, but "a whole lot more fluid came out" and soaked her shorts after voiding.  She states irregular and mild contractions are associated with none vaginal bleeding, along with active fetal movement.   Obstetrical/Gynecological History: OB History     Gravida  4   Para  2   Term  1   Preterm  1   AB  1   Living  2      SAB      IAB  1   Ectopic      Multiple      Live Births  2          Past Medical History: Past Medical History:  Diagnosis Date   Miscarriage 06/02/2019   Seizures (HCC)    last seizure Aug 2021    Past Surgical History: Past Surgical History:  Procedure Laterality Date   TOOTH EXTRACTION      Family History: Family History  Problem Relation Age of Onset   Asthma Mother    Seizures Mother    Depression Sister    Diabetes Sister        younger sister   Diabetes Father     Social History: Social History   Tobacco Use   Smoking status: Never   Smokeless tobacco: Never  Vaping Use   Vaping Use: Never used  Substance Use Topics   Alcohol use: Not Currently   Drug use: No    Allergies:  Allergies  Allergen Reactions   Acetaminophen Itching    Meds:  Medications Prior to Admission  Medication Sig Dispense Refill Last Dose   aspirin EC 81 MG tablet Take 1 tablet (81 mg total) by mouth daily. Take after 12 weeks for prevention of preeclampsia later in pregnancy 300 tablet 0 07/25/2020   cyclobenzaprine (FLEXERIL) 10 MG tablet Take 1 tablet (10 mg total) by mouth 2 (two) times daily as needed for muscle spasms. 20 tablet 0 07/25/2020   folic acid (FOLVITE) 1 MG tablet Take 4 tablets (4mg ) daily 120 tablet 11 07/26/2020   levETIRAcetam (KEPPRA) 500 MG tablet Take 1/2 tablet twice a day for 3 days, then increase to 1 tablet  twice a day 60 tablet 11 07/26/2020   Prenatal Vit-Fe Fumarate-FA (PRENATAL MULTIVITAMIN) TABS tablet Take 1 tablet by mouth daily at 12 noon.   07/26/2020   Blood Pressure Monitoring DEVI 1 each by Does not apply route once a week. 1 each 0    cyclobenzaprine (FLEXERIL) 10 MG tablet Take 1 tablet (10 mg total) by mouth 2 (two) times daily as needed for muscle spasms. 30 tablet 1     Review of Systems   Constitutional: Negative for fever and chills Eyes: Negative for visual disturbances Respiratory: Negative for shortness of breath, dyspnea Cardiovascular: Negative for chest pain or palpitations  Gastrointestinal: Negative for vomiting, diarrhea and constipation Genitourinary: Negative for dysuria and urgency Musculoskeletal: Negative for back pain, joint pain, myalgias.  Normal ROM  Neurological: Negative for dizziness and headaches    Physical Exam  Blood pressure 123/67, pulse (!) 115, temperature 98 F (36.7 C), temperature source Oral, resp. rate 20, height 5' (1.524 m), weight 110.9 kg, last menstrual period 11/06/2019.  .fcdvital GENERAL: Well-developed, well-nourished female in no acute distress.  LUNGS: Normal respiratory effort HEART: Regular rate and rhythm. ABDOMEN: Soft, nontender, nondistended, gravid.  EXTREMITIES: Nontender, no edema, 2+ distal pulses. DTR's 2+ PELVIC EXAM: SSE:  no pooling, normal appearing discharge w/o odor. Fern negative. Dilatation 3 cm   Effacement 0%   Station -3   Presentation: cephalic FHT:  Baseline rate 145 bpm   Variability moderate  Accelerations present   Decelerations none Contractions: rare   Labs: Results for orders placed or performed during the hospital encounter of 07/26/20 (from the past 24 hour(s))  Amnisure rupture of membrane (rom)not at [facility]   Collection Time: 07/26/20  9:32 PM  Result Value Ref Range   Amnisure ROM NEGATIVE   Fern Test   Collection Time: 07/26/20  9:49 PM  Result Value Ref Range   POCT Fern Test  Negative = intact amniotic membranes    Imaging Studies:    Assessment: Deborah Singleton is  25 y.o. [id number] at [redacted]w[redacted]d presents with no evidence of ROM.  Plan: DC home  Jacklyn Shell 6/27/202210:33 PM

## 2020-07-26 NOTE — Discharge Instructions (Signed)

## 2020-07-26 NOTE — MAU Note (Addendum)
Pt says at 6pm- she was feeling UC- she got on birthing ball- then shower. Layed down - then  Underwear wet- and still feels wetness.  [facility] - clinic  Denies HSV  Feels pain in groins

## 2020-07-27 ENCOUNTER — Telehealth: Payer: Self-pay

## 2020-07-27 ENCOUNTER — Other Ambulatory Visit: Payer: Self-pay

## 2020-07-27 ENCOUNTER — Ambulatory Visit (INDEPENDENT_AMBULATORY_CARE_PROVIDER_SITE_OTHER): Payer: Medicaid Other

## 2020-07-27 VITALS — BP 106/70 | HR 95 | Wt 241.9 lb

## 2020-07-27 DIAGNOSIS — O99350 Diseases of the nervous system complicating pregnancy, unspecified trimester: Secondary | ICD-10-CM

## 2020-07-27 DIAGNOSIS — G40909 Epilepsy, unspecified, not intractable, without status epilepticus: Secondary | ICD-10-CM

## 2020-07-27 NOTE — Telephone Encounter (Signed)
Called pt at 1215; pt reports pain has improved but is still having significant pelvic pressure. Reports taking Tylenol and Flexeril. Pt has been drinking water and has eaten this morning. Recommended pt come for nurse visit this afternoon for further evaluation of symptoms. Will see a provider if needed.

## 2020-07-27 NOTE — Telephone Encounter (Signed)
Pt called front office at 0930, call transferred to clinical staff. Pt reports continued pelvic pressure, back pain, and menstrual period like cramping. Per chart review pt was seen in MAU overnight. Found to be 3cm dilated, no active labor, no rupture of membranes. Explained to pt that these are normal things to experience at end of pregnancy. Recommended patient take 2 extra strength Tylenol, flexeril as prescribed, and to increase water intake.   Will call patient in 2 hours to reevaluate.

## 2020-07-27 NOTE — Progress Notes (Signed)
FHT- 145  Pt states having lower abd and back pain since yesterday. Was seen at MAU yesterday and was dilated to 3cm and negative testing for all ROM.  Dr Crissie Reese to come and see pt.  Cervix today: 3cm same as at MAU.   Pt advised PTL precautions and when to go to MAU. Pt agreeable and verbalized understanding to plan of care.   Judeth Cornfield, RN

## 2020-07-28 NOTE — Progress Notes (Signed)
Chart reviewed for nurse visit. Agree with plan of care.   Venora Maples, MD 07/28/20 1:55 PM

## 2020-07-30 ENCOUNTER — Other Ambulatory Visit: Payer: Self-pay

## 2020-07-30 ENCOUNTER — Inpatient Hospital Stay (HOSPITAL_COMMUNITY)
Admission: AD | Admit: 2020-07-30 | Discharge: 2020-07-30 | Disposition: A | Discharge status: Home / Self Care | Payer: Medicaid Other | Attending: Family Medicine | Admitting: Family Medicine

## 2020-07-30 ENCOUNTER — Encounter (HOSPITAL_COMMUNITY): Payer: Self-pay | Admitting: Family Medicine

## 2020-07-30 DIAGNOSIS — Z3A35 35 weeks gestation of pregnancy: Secondary | ICD-10-CM | POA: Insufficient documentation

## 2020-07-30 DIAGNOSIS — O212 Late vomiting of pregnancy: Secondary | ICD-10-CM | POA: Diagnosis not present

## 2020-07-30 DIAGNOSIS — M549 Dorsalgia, unspecified: Secondary | ICD-10-CM | POA: Diagnosis not present

## 2020-07-30 DIAGNOSIS — O26893 Other specified pregnancy related conditions, third trimester: Secondary | ICD-10-CM | POA: Insufficient documentation

## 2020-07-30 DIAGNOSIS — G40909 Epilepsy, unspecified, not intractable, without status epilepticus: Secondary | ICD-10-CM | POA: Insufficient documentation

## 2020-07-30 DIAGNOSIS — Z79899 Other long term (current) drug therapy: Secondary | ICD-10-CM | POA: Diagnosis not present

## 2020-07-30 DIAGNOSIS — O99350 Diseases of the nervous system complicating pregnancy, unspecified trimester: Secondary | ICD-10-CM | POA: Diagnosis not present

## 2020-07-30 DIAGNOSIS — R102 Pelvic and perineal pain: Secondary | ICD-10-CM | POA: Insufficient documentation

## 2020-07-30 LAB — URINALYSIS, ROUTINE W REFLEX MICROSCOPIC
Bilirubin Urine: NEGATIVE
Glucose, UA: NEGATIVE mg/dL
Hgb urine dipstick: NEGATIVE
Ketones, ur: NEGATIVE mg/dL
Leukocytes,Ua: NEGATIVE
Nitrite: NEGATIVE
Protein, ur: NEGATIVE mg/dL
Specific Gravity, Urine: 1.023 (ref 1.005–1.030)
pH: 6 (ref 5.0–8.0)

## 2020-07-30 MED ORDER — CYCLOBENZAPRINE HCL 10 MG PO TABS: 10.0000 mg | ORAL_TABLET | Freq: Two times a day (BID) | ORAL | 2 refills | Status: DC | PRN

## 2020-07-30 MED ORDER — CYCLOBENZAPRINE HCL 5 MG PO TABS
10.0000 mg | ORAL_TABLET | Freq: Once | ORAL | Status: AC
  Administered 2020-07-30: 10 mg via ORAL
  Filled 2020-07-30: qty 2

## 2020-07-30 NOTE — MAU Note (Addendum)
...  Deborah Singleton is a 25 y.o. at [redacted]w[redacted]d here in MAU reporting: constant pressure in her lower back that she rates 7/10 that is worse when standing. She also states she has thrown up three times in the last 24 hours randomly. She states she has still been able to eat and drink fluids. Last meal an hour ago. She states she has had vaginal spotting for the past two days. No LOF. +FM.  States she has been "taking pills to stop my CTX but it is not working. I was 3cm the last time I was here." When asking patient about further clarification of the pills she was taking to stop CTX, patient stated she had not taken them yet and could not recall the name.  Lab orders placed from triage:  UA

## 2020-07-30 NOTE — MAU Provider Note (Signed)
History     CSN: [id number]  Arrival date and time: 07/30/20 1545   Event Date/Time   First Provider Initiated Contact with Patient 07/30/20 1626      Chief Complaint  Patient presents with   Nausea   Emesis   Back Pain   HPI Deborah Singleton is a 25 y.o. [id number] at [redacted]w[redacted]d who presents to MAU with multiple complaints:   Vomiting This is a new problem, onset within the past 24 hours. Patient states she has vomited 3 times. She is able to tolerate PO intake.   Pelvic pressure, back pain, generalized discomfort These are recurrent problems. Patient denies contractions but states her pelvis and back are so uncomfortable she is concerned she is changing her cervix. Pain score is 7/10. She has not taken medication or tried other treatments for this complaint.  Patient denies vaginal bleeding, leaking of fluid, decreased fetal movement, fever, falls, or recent illness.   She receives care with [facility].  OB History     Gravida  4   Para  2   Term  1   Preterm  1   AB  1   Living  2      SAB      IAB  1   Ectopic      Multiple      Live Births  2           Past Medical History:  Diagnosis Date   Miscarriage 06/02/2019   Seizures (HCC)    last seizure Aug 2021    Past Surgical History:  Procedure Laterality Date   TOOTH EXTRACTION      Family History  Problem Relation Age of Onset   Asthma Mother    Seizures Mother    Depression Sister    Diabetes Sister        younger sister   Diabetes Father     Social History   Tobacco Use   Smoking status: Never   Smokeless tobacco: Never  Vaping Use   Vaping Use: Never used  Substance Use Topics   Alcohol use: Not Currently   Drug use: No    Allergies:  Allergies  Allergen Reactions   Acetaminophen Itching    Patient states "whatever I was given when I went home after my last baby had tylenol and I had black spots over my legs."    Medications Prior to Admission  Medication Sig Dispense Refill  Last Dose   aspirin EC 81 MG tablet Take 1 tablet (81 mg total) by mouth daily. Take after 12 weeks for prevention of preeclampsia later in pregnancy 300 tablet 0 07/29/2020   folic acid (FOLVITE) 1 MG tablet Take 4 tablets (4mg ) daily 120 tablet 11 07/29/2020   levETIRAcetam (KEPPRA) 500 MG tablet Take 1/2 tablet twice a day for 3 days, then increase to 1 tablet twice a day 60 tablet 11 07/29/2020   Prenatal Vit-Fe Fumarate-FA (PRENATAL MULTIVITAMIN) TABS tablet Take 1 tablet by mouth daily at 12 noon.   07/29/2020   Blood Pressure Monitoring DEVI 1 each by Does not apply route once a week. 1 each 0    [DISCONTINUED] cyclobenzaprine (FLEXERIL) 10 MG tablet Take 1 tablet (10 mg total) by mouth 2 (two) times daily as needed for muscle spasms. 20 tablet 0     Review of Systems  Genitourinary:  Positive for pelvic pain.  All other systems reviewed and are negative. Physical Exam   Blood pressure 119/65, pulse (!) 105,  temperature 98.4 F (36.9 C), temperature source Oral, resp. rate 19, last menstrual period 11/06/2019, SpO2 98 %.  Physical Exam Vitals and nursing note reviewed. Exam conducted with a chaperone present.  Constitutional:      Appearance: Normal appearance. She is obese. She is not ill-appearing.  Cardiovascular:     Rate and Rhythm: Normal rate and regular rhythm.     Pulses: Normal pulses.     Heart sounds: Normal heart sounds.  Pulmonary:     Effort: Pulmonary effort is normal.     Breath sounds: Normal breath sounds.  Abdominal:     Comments: gravid   Skin:    Capillary Refill: Capillary refill takes less than 2 seconds.  Neurological:     Mental Status: She is alert and oriented to person, place, and time.  Psychiatric:        Mood and Affect: Mood normal.        Behavior: Behavior normal.        Thought Content: Thought content normal.        Judgment: Judgment normal.    MAU Course  Procedures  --Reactive tracing: baseline 130, mod var, + accels, no  decels --Toco: quiet --Pain improving with Flexeril. Pain score now 5/10, previously 7/10. Refills added to previous prescription.  Orders Placed This Encounter  Procedures   Urinalysis, Routine w reflex microscopic Urine, Clean Catch   Discharge patient   Patient Vitals for the past 24 hrs:  BP Temp Temp src Pulse Resp SpO2  07/30/20 1738 131/69 -- -- 83 -- 100 %  07/30/20 1622 119/65 -- -- (!) 105 -- 98 %  07/30/20 1618 117/60 98.4 F (36.9 C) Oral 100 19 99 %   Results for orders placed or performed during the hospital encounter of 07/30/20 (from the past 24 hour(s))  Urinalysis, Routine w reflex microscopic Urine, Clean Catch     Status: None   Collection Time: 07/30/20  4:06 PM  Result Value Ref Range   Color, Urine YELLOW YELLOW   APPearance CLEAR CLEAR   Specific Gravity, Urine 1.023 1.005 - 1.030   pH 6.0 5.0 - 8.0   Glucose, UA NEGATIVE NEGATIVE mg/dL   Hgb urine dipstick NEGATIVE NEGATIVE   Bilirubin Urine NEGATIVE NEGATIVE   Ketones, ur NEGATIVE NEGATIVE mg/dL   Protein, ur NEGATIVE NEGATIVE mg/dL   Nitrite NEGATIVE NEGATIVE   Leukocytes,Ua NEGATIVE NEGATIVE   Meds ordered this encounter  Medications   cyclobenzaprine (FLEXERIL) tablet 10 mg   cyclobenzaprine (FLEXERIL) 10 MG tablet    Sig: Take 1 tablet (10 mg total) by mouth 2 (two) times daily as needed for muscle spasms.    Dispense:  20 tablet    Refill:  2    Order Specific Question:   Supervising Provider    Answer:   Reva Bores [[id number]]   Assessment and Plan  --25 y.o. [phone] at [redacted]w[redacted]d  --Reactive tracing --Cervix 3/thick/ballotable, unchanged from previous --Recheck of cervix declined by patient --Vomiting resolved without intervention, no ketonuria --Discharge home in stable condition  F/U: --Next appointment [facility] is 08/05/2020  Calvert Cantor, CNM 07/30/2020, 8:24 PM

## 2020-08-05 ENCOUNTER — Ambulatory Visit (INDEPENDENT_AMBULATORY_CARE_PROVIDER_SITE_OTHER): Payer: Medicaid Other

## 2020-08-05 ENCOUNTER — Ambulatory Visit: Payer: Medicaid Other | Admitting: *Deleted

## 2020-08-05 ENCOUNTER — Ambulatory Visit (INDEPENDENT_AMBULATORY_CARE_PROVIDER_SITE_OTHER): Payer: Medicaid Other | Admitting: Obstetrics and Gynecology

## 2020-08-05 ENCOUNTER — Other Ambulatory Visit (HOSPITAL_COMMUNITY)
Admission: RE | Admit: 2020-08-05 | Discharge: 2020-08-05 | Disposition: A | Discharge status: Home / Self Care | Payer: Medicaid Other | Source: Ambulatory Visit | Attending: Obstetrics and Gynecology | Admitting: Obstetrics and Gynecology

## 2020-08-05 ENCOUNTER — Other Ambulatory Visit: Payer: Self-pay

## 2020-08-05 VITALS — BP 117/76 | HR 94 | Wt 242.0 lb

## 2020-08-05 DIAGNOSIS — O99350 Diseases of the nervous system complicating pregnancy, unspecified trimester: Secondary | ICD-10-CM

## 2020-08-05 DIAGNOSIS — O99210 Obesity complicating pregnancy, unspecified trimester: Secondary | ICD-10-CM

## 2020-08-05 DIAGNOSIS — Z3480 Encounter for supervision of other normal pregnancy, unspecified trimester: Secondary | ICD-10-CM | POA: Diagnosis not present

## 2020-08-05 DIAGNOSIS — Z3A36 36 weeks gestation of pregnancy: Secondary | ICD-10-CM | POA: Insufficient documentation

## 2020-08-05 DIAGNOSIS — G40909 Epilepsy, unspecified, not intractable, without status epilepticus: Secondary | ICD-10-CM

## 2020-08-05 DIAGNOSIS — Z2839 Other underimmunization status: Secondary | ICD-10-CM

## 2020-08-05 DIAGNOSIS — O09899 Supervision of other high risk pregnancies, unspecified trimester: Secondary | ICD-10-CM

## 2020-08-05 DIAGNOSIS — G40309 Generalized idiopathic epilepsy and epileptic syndromes, not intractable, without status epilepticus: Secondary | ICD-10-CM

## 2020-08-05 NOTE — Progress Notes (Signed)
   PRENATAL VISIT NOTE  Subjective:  Deborah Singleton is a 25 y.o. [id number] at [redacted]w[redacted]d being seen today for ongoing prenatal care.  She is currently monitored for the following issues for this high-risk pregnancy and has Generalized idiopathic epilepsy and epileptic syndromes, not intractable, without status epilepticus ([place]); Supervision of other normal pregnancy, antepartum; Seizure disorder in pregnancy, antepartum ([place]); Obesity during pregnancy, antepartum; Maternal varicella, non-immune; and [redacted] weeks gestation of pregnancy on their problem list.  Patient doing well with no acute concerns today. She reports  spotting the previous day, none today .  Contractions: Irritability. Vag. Bleeding: Small.  Movement: Present. Denies leaking of fluid.   The patient is upset she cannot be induced because she is uncomfortable.  Discussed induction guidelines that have to be met for early delivery.  Pt may be candidate for elective induction at 39 weeks.  The following portions of the patient's history were reviewed and updated as appropriate: allergies, current medications, past family history, past medical history, past social history, past surgical history and problem list. Problem list updated.  Objective:   Vitals:   08/05/20 1057  BP: 117/76  Pulse: 94  Weight: 242 lb (109.8 kg)    Fetal Status: Fetal Heart Rate (bpm): 143 Fundal Height: 36 cm Movement: Present     General:  Alert, oriented and cooperative. Patient is in no acute distress.  Skin: Skin is warm and dry. No rash noted.   Cardiovascular: Normal heart rate noted  Respiratory: Normal respiratory effort, no problems with respiration noted  Abdomen: Soft, gravid, appropriate for gestational age.  Pain/Pressure: Present     Pelvic: Cervical exam performed Dilation: 1.5 Effacement (%): 40 Station: Ballotable  Extremities: Normal range of motion.  Edema: None  Mental Status:  Normal mood and affect. Normal behavior. Normal judgment and  thought content.   Assessment and Plan:  Pregnancy: [id number] at [redacted]w[redacted]d  1. Maternal varicella, non-immune Consider vaccination after delivery  2. Supervision of other normal pregnancy, antepartum Continue routine care - Strep Gp B NAA - GC/Chlamydia probe amp ([place])not at [facility]  3. Generalized idiopathic epilepsy and epileptic syndromes, not intractable, without status epilepticus ([place]) Pt continues with keppra  4. Seizure disorder in pregnancy, antepartum ([place])   5. [redacted] weeks gestation of pregnancy   Preterm labor symptoms and general obstetric precautions including but not limited to vaginal bleeding, contractions, leaking of fluid and fetal movement were reviewed in detail with the patient.  Please refer to After Visit Summary for other counseling recommendations.   Return in about 1 week (around 08/12/2020) for [facility], in person.   Lynnda Shields, MD Faculty Attending Center for [facility]

## 2020-08-05 NOTE — Progress Notes (Signed)

## 2020-08-06 LAB — GC/CHLAMYDIA PROBE AMP (~~LOC~~) NOT AT ARMC
Chlamydia: NEGATIVE
Comment: NEGATIVE
Comment: NORMAL
Neisseria Gonorrhea: NEGATIVE

## 2020-08-07 ENCOUNTER — Encounter (HOSPITAL_COMMUNITY): Payer: Self-pay | Admitting: Obstetrics & Gynecology

## 2020-08-07 ENCOUNTER — Inpatient Hospital Stay (HOSPITAL_COMMUNITY)
Admission: AD | Admit: 2020-08-07 | Discharge: 2020-08-08 | Disposition: A | Discharge status: Home / Self Care | Payer: Medicaid Other | Attending: Obstetrics & Gynecology | Admitting: Obstetrics & Gynecology

## 2020-08-07 ENCOUNTER — Other Ambulatory Visit: Payer: Self-pay

## 2020-08-07 DIAGNOSIS — O133 Gestational [pregnancy-induced] hypertension without significant proteinuria, third trimester: Secondary | ICD-10-CM | POA: Insufficient documentation

## 2020-08-07 DIAGNOSIS — O479 False labor, unspecified: Secondary | ICD-10-CM

## 2020-08-07 DIAGNOSIS — Z3A36 36 weeks gestation of pregnancy: Secondary | ICD-10-CM | POA: Insufficient documentation

## 2020-08-07 LAB — STREP GP B NAA: Strep Gp B NAA: NEGATIVE

## 2020-08-07 NOTE — MAU Note (Signed)
Deborah Singleton is a 25 y.o. at [redacted]w[redacted]d here in MAU reporting: having contractions since this morning around 0200. Pt states they went away but now are close together. Denies abnormal discharge or LOF. +FM  Onset of complaint: 08/07/2020 @0200  Pain score: 8/10 Vitals:   08/07/20 2315  BP: 134/76  Resp: 18  Temp: 98.5 F (36.9 C)  SpO2: 100%     FHT:158 Lab orders placed from triage:  UA

## 2020-08-08 DIAGNOSIS — Z3A36 36 weeks gestation of pregnancy: Secondary | ICD-10-CM | POA: Diagnosis not present

## 2020-08-08 DIAGNOSIS — O133 Gestational [pregnancy-induced] hypertension without significant proteinuria, third trimester: Secondary | ICD-10-CM | POA: Diagnosis not present

## 2020-08-08 LAB — URINALYSIS, ROUTINE W REFLEX MICROSCOPIC
Bilirubin Urine: NEGATIVE
Glucose, UA: NEGATIVE mg/dL
Hgb urine dipstick: NEGATIVE
Ketones, ur: 5 mg/dL — AB
Nitrite: NEGATIVE
Protein, ur: NEGATIVE mg/dL
Specific Gravity, Urine: 1.025 (ref 1.005–1.030)
pH: 5 (ref 5.0–8.0)

## 2020-08-12 ENCOUNTER — Ambulatory Visit: Payer: Medicaid Other | Admitting: *Deleted

## 2020-08-12 ENCOUNTER — Inpatient Hospital Stay (HOSPITAL_COMMUNITY)
Admission: AD | Admit: 2020-08-12 | Discharge: 2020-08-12 | Disposition: A | Discharge status: Home / Self Care | Payer: Medicaid Other | Attending: Obstetrics & Gynecology | Admitting: Obstetrics & Gynecology

## 2020-08-12 ENCOUNTER — Ambulatory Visit (INDEPENDENT_AMBULATORY_CARE_PROVIDER_SITE_OTHER): Payer: Medicaid Other

## 2020-08-12 ENCOUNTER — Encounter (HOSPITAL_COMMUNITY): Payer: Self-pay | Admitting: Obstetrics & Gynecology

## 2020-08-12 ENCOUNTER — Other Ambulatory Visit: Payer: Self-pay

## 2020-08-12 VITALS — BP 139/74 | HR 91

## 2020-08-12 DIAGNOSIS — O4703 False labor before 37 completed weeks of gestation, third trimester: Secondary | ICD-10-CM | POA: Diagnosis not present

## 2020-08-12 DIAGNOSIS — O479 False labor, unspecified: Secondary | ICD-10-CM

## 2020-08-12 DIAGNOSIS — O471 False labor at or after 37 completed weeks of gestation: Secondary | ICD-10-CM | POA: Diagnosis not present

## 2020-08-12 DIAGNOSIS — O99210 Obesity complicating pregnancy, unspecified trimester: Secondary | ICD-10-CM | POA: Diagnosis not present

## 2020-08-12 DIAGNOSIS — Z3689 Encounter for other specified antenatal screening: Secondary | ICD-10-CM

## 2020-08-12 DIAGNOSIS — Z3A37 37 weeks gestation of pregnancy: Secondary | ICD-10-CM | POA: Diagnosis not present

## 2020-08-12 NOTE — MAU Note (Signed)
.  I have communicated with Donette Larry, CNM and reviewed vital signs:  Vitals:   08/12/20 0745 08/12/20 0750  BP:  131/73  Pulse:  79  Resp:    Temp:    SpO2: 99% 99%    Vaginal exam:  Dilation: 1 Effacement (%): 50 Station: -3 Presentation: Vertex Exam by:: Nancee Liter, RN,   Also reviewed contraction pattern and that non-stress test is reactive.  It has been documented that patient is contracting every 6-8 minutes with no cervical change over 1.5 hours not indicating active labor.  Patient denies any other complaints.  Based on this report provider has given order for discharge.  A discharge order and diagnosis entered by a provider.   Labor discharge instructions reviewed with patient.

## 2020-08-12 NOTE — MAU Provider Note (Signed)
Per RN: Deborah Singleton is a 25 y.o. [phone] at [redacted]w[redacted]d  who presents to MAU today complaining contractions q 2-3 minutes since 0500. No VB or LOF. +FM.   O: BP 131/73   Pulse 79   Temp 98.3 F (36.8 C) (Oral)   Resp 15   LMP 11/06/2019   SpO2 99%   Cervical exam:  Dilation: 1 Effacement (%): 50 Station: -3 Presentation: Vertex Exam by:: Nancee Liter, RN  Fetal Monitoring: Baseline: 130 Variability: mod Accelerations: + Decelerations: no Contractions: q6-9   A: SIUP at [redacted]w[redacted]d  False labor Reactive NST  P: Discharge home Follow up @[facility]  as scheduled Labor precautions  , CNM 08/12/2020 9:23 AM

## 2020-08-12 NOTE — Progress Notes (Signed)
Pt informed that the ultrasound is considered a limited OB ultrasound and is not intended to be a complete ultrasound exam.  Patient also informed that the ultrasound is not being completed with the intent of assessing for fetal or placental anomalies or any pelvic abnormalities.  Explained that the purpose of today's ultrasound is to assess for presentation, BPP and amniotic fluid volume.  Patient acknowledges the purpose of the exam and the limitations of the study.   Pt seen @ MAU 2 hours prior to BPP today and had reactive FHR tracing, therefore NST not performed as originally scheduled.  Dr. Macon Large in agreement.

## 2020-08-12 NOTE — MAU Note (Signed)
.  Deborah Singleton is a 25 y.o. at [redacted]w[redacted]d here in MAU reporting: ctx that started around 0500 this morning. States that they were 2-3 minutes apart. Denies VB or LOF. Endorses good fetal movement.   Pain score: 8 Vitals:   08/12/20 0744 08/12/20 0745  BP: 137/83   Pulse: 82   Resp: 15   Temp: 98.3 F (36.8 C)   SpO2:  99%     FHT:137

## 2020-08-16 ENCOUNTER — Other Ambulatory Visit: Payer: Self-pay

## 2020-08-16 ENCOUNTER — Ambulatory Visit (INDEPENDENT_AMBULATORY_CARE_PROVIDER_SITE_OTHER): Payer: Medicaid Other | Admitting: Obstetrics and Gynecology

## 2020-08-16 VITALS — BP 117/70 | HR 102 | Wt 245.0 lb

## 2020-08-16 DIAGNOSIS — Z23 Encounter for immunization: Secondary | ICD-10-CM

## 2020-08-16 DIAGNOSIS — G40309 Generalized idiopathic epilepsy and epileptic syndromes, not intractable, without status epilepticus: Secondary | ICD-10-CM

## 2020-08-16 DIAGNOSIS — Z3480 Encounter for supervision of other normal pregnancy, unspecified trimester: Secondary | ICD-10-CM | POA: Diagnosis not present

## 2020-08-16 DIAGNOSIS — Z2839 Other underimmunization status: Secondary | ICD-10-CM

## 2020-08-16 DIAGNOSIS — Z3A37 37 weeks gestation of pregnancy: Secondary | ICD-10-CM | POA: Insufficient documentation

## 2020-08-16 DIAGNOSIS — O09899 Supervision of other high risk pregnancies, unspecified trimester: Secondary | ICD-10-CM

## 2020-08-16 NOTE — Progress Notes (Signed)
   PRENATAL VISIT NOTE  Subjective:  Deborah Singleton is a 25 y.o. [id number] at [redacted]w[redacted]d being seen today for ongoing prenatal care.  She is currently monitored for the following issues for this high-risk pregnancy and has Generalized idiopathic epilepsy and epileptic syndromes, not intractable, without status epilepticus (HCC); Supervision of other normal pregnancy, antepartum; Seizure disorder in pregnancy, antepartum (HCC); Obesity during pregnancy, antepartum; Maternal varicella, non-immune; and [redacted] weeks gestation of pregnancy on their problem list.  Patient doing well with no acute concerns today. She reports no complaints.  Contractions: Irritability. Vag. Bleeding: Small.  Movement: Present. Denies leaking of fluid.   The following portions of the patient's history were reviewed and updated as appropriate: allergies, current medications, past family history, past medical history, past social history, past surgical history and problem list. Problem list updated.  Objective:   Vitals:   08/16/20 1107  BP: 117/70  Pulse: (!) 102  Weight: 245 lb (111.1 kg)    Fetal Status: Fetal Heart Rate (bpm): 138 Fundal Height: 38 cm Movement: Present     General:  Alert, oriented and cooperative. Patient is in no acute distress.  Skin: Skin is warm and dry. No rash noted.   Cardiovascular: Normal heart rate noted  Respiratory: Normal respiratory effort, no problems with respiration noted  Abdomen: Soft, gravid, appropriate for gestational age.  Pain/Pressure: Present     Pelvic: Cervical exam deferred        Extremities: Normal range of motion.  Edema: None  Mental Status:  Normal mood and affect. Normal behavior. Normal judgment and thought content.   Assessment and Plan:  Pregnancy: [id number] at [redacted]w[redacted]d  1. Supervision of other normal pregnancy, antepartum Continue routine care - Tdap vaccine greater than or equal to 7yo IM  2. Generalized idiopathic epilepsy and epileptic syndromes, not  intractable, without status epilepticus (HCC) Pt continues with keppra, weekly testing until delivery per MFM  3. Maternal varicella, non-immune Offer vaccine after delivery  4. [redacted] weeks gestation of pregnancy   Term labor symptoms and general obstetric precautions including but not limited to vaginal bleeding, contractions, leaking of fluid and fetal movement were reviewed in detail with the patient.  Please refer to After Visit Summary for other counseling recommendations.   Return in about 1 week (around 08/23/2020) for [facility], in person.   Mariel Aloe, MD Faculty Attending Center for [facility]

## 2020-08-19 ENCOUNTER — Encounter (HOSPITAL_COMMUNITY): Payer: Self-pay | Admitting: Obstetrics and Gynecology

## 2020-08-19 ENCOUNTER — Inpatient Hospital Stay (HOSPITAL_COMMUNITY)
Admission: AD | Admit: 2020-08-19 | Discharge: 2020-08-22 | DRG: 797 | Disposition: A | Discharge status: Home / Self Care | Payer: Medicaid Other | Attending: Obstetrics & Gynecology | Admitting: Obstetrics & Gynecology

## 2020-08-19 ENCOUNTER — Ambulatory Visit (HOSPITAL_BASED_OUTPATIENT_CLINIC_OR_DEPARTMENT_OTHER): Payer: Medicaid Other

## 2020-08-19 ENCOUNTER — Other Ambulatory Visit: Payer: Self-pay

## 2020-08-19 ENCOUNTER — Ambulatory Visit: Payer: Medicaid Other | Admitting: *Deleted

## 2020-08-19 ENCOUNTER — Other Ambulatory Visit: Payer: Self-pay | Admitting: Obstetrics and Gynecology

## 2020-08-19 VITALS — BP 141/71 | HR 94

## 2020-08-19 DIAGNOSIS — O99353 Diseases of the nervous system complicating pregnancy, third trimester: Secondary | ICD-10-CM | POA: Diagnosis not present

## 2020-08-19 DIAGNOSIS — O99354 Diseases of the nervous system complicating childbirth: Secondary | ICD-10-CM | POA: Diagnosis present

## 2020-08-19 DIAGNOSIS — O134 Gestational [pregnancy-induced] hypertension without significant proteinuria, complicating childbirth: Secondary | ICD-10-CM | POA: Diagnosis not present

## 2020-08-19 DIAGNOSIS — E669 Obesity, unspecified: Secondary | ICD-10-CM

## 2020-08-19 DIAGNOSIS — Z20822 Contact with and (suspected) exposure to covid-19: Secondary | ICD-10-CM | POA: Diagnosis not present

## 2020-08-19 DIAGNOSIS — O99214 Obesity complicating childbirth: Secondary | ICD-10-CM | POA: Diagnosis present

## 2020-08-19 DIAGNOSIS — O99350 Diseases of the nervous system complicating pregnancy, unspecified trimester: Secondary | ICD-10-CM

## 2020-08-19 DIAGNOSIS — Z3A38 38 weeks gestation of pregnancy: Secondary | ICD-10-CM | POA: Diagnosis not present

## 2020-08-19 DIAGNOSIS — Z362 Encounter for other antenatal screening follow-up: Secondary | ICD-10-CM | POA: Diagnosis not present

## 2020-08-19 DIAGNOSIS — O99213 Obesity complicating pregnancy, third trimester: Secondary | ICD-10-CM

## 2020-08-19 DIAGNOSIS — G40909 Epilepsy, unspecified, not intractable, without status epilepticus: Secondary | ICD-10-CM | POA: Diagnosis present

## 2020-08-19 DIAGNOSIS — Z302 Encounter for sterilization: Secondary | ICD-10-CM

## 2020-08-19 DIAGNOSIS — O133 Gestational [pregnancy-induced] hypertension without significant proteinuria, third trimester: Secondary | ICD-10-CM | POA: Diagnosis not present

## 2020-08-19 DIAGNOSIS — Z6841 Body Mass Index (BMI) 40.0 and over, adult: Secondary | ICD-10-CM

## 2020-08-19 LAB — CBC WITH DIFFERENTIAL/PLATELET
Abs Immature Granulocytes: 0.04 10*3/uL (ref 0.00–0.07)
Basophils Absolute: 0 10*3/uL (ref 0.0–0.1)
Basophils Relative: 0 %
Eosinophils Absolute: 0.1 10*3/uL (ref 0.0–0.5)
Eosinophils Relative: 1 %
HCT: 34.5 % — ABNORMAL LOW (ref 36.0–46.0)
Hemoglobin: 10.4 g/dL — ABNORMAL LOW (ref 12.0–15.0)
Immature Granulocytes: 0 %
Lymphocytes Relative: 25 %
Lymphs Abs: 2.3 10*3/uL (ref 0.7–4.0)
MCH: 22.7 pg — ABNORMAL LOW (ref 26.0–34.0)
MCHC: 30.1 g/dL (ref 30.0–36.0)
MCV: 75.3 fL — ABNORMAL LOW (ref 80.0–100.0)
Monocytes Absolute: 0.9 10*3/uL (ref 0.1–1.0)
Monocytes Relative: 10 %
Neutro Abs: 5.9 10*3/uL (ref 1.7–7.7)
Neutrophils Relative %: 64 %
Platelets: 298 10*3/uL (ref 150–400)
RBC: 4.58 MIL/uL (ref 3.87–5.11)
RDW: 15.9 % — ABNORMAL HIGH (ref 11.5–15.5)
WBC: 9.2 10*3/uL (ref 4.0–10.5)
nRBC: 0 % (ref 0.0–0.2)

## 2020-08-19 LAB — URINALYSIS, ROUTINE W REFLEX MICROSCOPIC
Bilirubin Urine: NEGATIVE
Glucose, UA: NEGATIVE mg/dL
Hgb urine dipstick: NEGATIVE
Ketones, ur: NEGATIVE mg/dL
Leukocytes,Ua: NEGATIVE
Nitrite: NEGATIVE
Protein, ur: NEGATIVE mg/dL
Specific Gravity, Urine: 1.013 (ref 1.005–1.030)
pH: 6 (ref 5.0–8.0)

## 2020-08-19 LAB — COMPREHENSIVE METABOLIC PANEL
ALT: 15 U/L (ref 0–44)
AST: 25 U/L (ref 15–41)
Albumin: 2.6 g/dL — ABNORMAL LOW (ref 3.5–5.0)
Alkaline Phosphatase: 177 U/L — ABNORMAL HIGH (ref 38–126)
Anion gap: 7 (ref 5–15)
BUN: 6 mg/dL (ref 6–20)
CO2: 21 mmol/L — ABNORMAL LOW (ref 22–32)
Calcium: 8.8 mg/dL — ABNORMAL LOW (ref 8.9–10.3)
Chloride: 106 mmol/L (ref 98–111)
Creatinine, Ser: 0.77 mg/dL (ref 0.44–1.00)
GFR, Estimated: >60 mL/min (ref >60)
Glucose, Bld: 114 mg/dL — ABNORMAL HIGH (ref 70–99)
Potassium: 3.7 mmol/L (ref 3.5–5.1)
Sodium: 134 mmol/L — ABNORMAL LOW (ref 135–145)
Total Bilirubin: 0.3 mg/dL (ref 0.3–1.2)
Total Protein: 6 g/dL — ABNORMAL LOW (ref 6.5–8.1)

## 2020-08-19 LAB — PROTEIN / CREATININE RATIO, URINE
Creatinine, Urine: 111.69 mg/dL
Protein Creatinine Ratio: 0.07 mg/mg{Cre} (ref 0.00–0.15)
Total Protein, Urine: 8 mg/dL

## 2020-08-19 LAB — TYPE AND SCREEN
ABO/RH(D): O POS
Antibody Screen: NEGATIVE

## 2020-08-19 MED ORDER — LIDOCAINE HCL (PF) 1 % IJ SOLN: 30.0000 mL | INTRAMUSCULAR | Status: DC | PRN

## 2020-08-19 MED ORDER — SOD CITRATE-CITRIC ACID 500-334 MG/5ML PO SOLN: 30.0000 mL | ORAL | Status: DC | PRN

## 2020-08-19 MED ORDER — ZOLPIDEM TARTRATE 5 MG PO TABS: 5.0000 mg | ORAL_TABLET | Freq: Every evening | ORAL | Status: DC | PRN

## 2020-08-19 MED ORDER — OXYTOCIN BOLUS FROM INFUSION
333.0000 mL | Freq: Once | INTRAVENOUS | Status: AC
  Administered 2020-08-20: 333 mL via INTRAVENOUS

## 2020-08-19 MED ORDER — OXYTOCIN-SODIUM CHLORIDE 30-0.9 UT/500ML-% IV SOLN
1.0000 m[IU]/min | INTRAVENOUS | Status: DC
  Administered 2020-08-20 (×2): 2 m[IU]/min via INTRAVENOUS

## 2020-08-19 MED ORDER — OXYCODONE-ACETAMINOPHEN 5-325 MG PO TABS: 2.0000 | ORAL_TABLET | ORAL | Status: DC | PRN

## 2020-08-19 MED ORDER — LACTATED RINGERS IV SOLN: INTRAVENOUS | Status: DC

## 2020-08-19 MED ORDER — OXYCODONE-ACETAMINOPHEN 5-325 MG PO TABS: 1.0000 | ORAL_TABLET | ORAL | Status: DC | PRN

## 2020-08-19 MED ORDER — FLEET ENEMA 7-19 GM/118ML RE ENEM
1.0000 | ENEMA | RECTAL | Status: DC | PRN
Start: 2020-08-19 — End: 2020-08-20

## 2020-08-19 MED ORDER — ACETAMINOPHEN 500 MG PO TABS
1000.0000 mg | ORAL_TABLET | Freq: Once | ORAL | Status: AC
  Administered 2020-08-19: 1000 mg via ORAL
  Filled 2020-08-19: qty 2

## 2020-08-19 MED ORDER — ONDANSETRON HCL 4 MG/2ML IJ SOLN
4.0000 mg | Freq: Four times a day (QID) | INTRAMUSCULAR | Status: DC | PRN
  Filled 2020-08-19: qty 2

## 2020-08-19 MED ORDER — MISOPROSTOL 25 MCG QUARTER TABLET
25.0000 ug | ORAL_TABLET | ORAL | Status: DC | PRN
Start: 2020-08-19 — End: 2020-08-20

## 2020-08-19 MED ORDER — FENTANYL CITRATE (PF) 100 MCG/2ML IJ SOLN
100.0000 ug | INTRAMUSCULAR | Status: DC | PRN
  Administered 2020-08-20 (×2): 100 ug via INTRAVENOUS
  Filled 2020-08-19 (×3): qty 2

## 2020-08-19 MED ORDER — ACETAMINOPHEN 325 MG PO TABS: 650.0000 mg | ORAL_TABLET | ORAL | Status: DC | PRN

## 2020-08-19 MED ORDER — OXYTOCIN-SODIUM CHLORIDE 30-0.9 UT/500ML-% IV SOLN
2.5000 [IU]/h | INTRAVENOUS | Status: DC
  Filled 2020-08-19: qty 500

## 2020-08-19 MED ORDER — TERBUTALINE SULFATE 1 MG/ML IJ SOLN: 0.2500 mg | Freq: Once | INTRAMUSCULAR | Status: DC | PRN

## 2020-08-19 MED ORDER — LACTATED RINGERS IV SOLN
500.0000 mL | INTRAVENOUS | Status: DC | PRN
  Administered 2020-08-20: 500 mL via INTRAVENOUS

## 2020-08-19 NOTE — MAU Provider Note (Signed)
Chief Complaint  Patient presents with   Hypertension   Headache     Event Date/Time   First Provider Initiated Contact with Patient 08/19/20 2129      S: Deborah Singleton  is a 25 y.o. y.o. year old [phone] female at [redacted]w[redacted]d weeks gestation who presents to MAU with elevated blood pressures. denies Hx of hypertension. Reports 7/10 headache that started while she was on her way to the hospital.  Was in MFM today for an ultrasound & had an elevated blood pressure.   Associated symptoms: endorses Headache, denies vision changes, denies epigastric pain Contractions: denies Vaginal bleeding: denies Fetal movement: good  O: Patient Vitals for the past 24 hrs:  BP Temp Pulse Resp SpO2 Height Weight  08/19/20 2145 (!) 147/94 -- 97 -- 99 % -- --  08/19/20 2132 (!) 143/82 -- 96 -- -- -- --  08/19/20 2131 (!) 156/95 -- 100 -- -- -- --  08/19/20 2130 -- -- -- -- 100 % -- --  08/19/20 2125 -- -- -- -- 99 % -- --  08/19/20 2120 -- -- -- -- 99 % -- --  08/19/20 2115 -- -- -- -- 99 % -- --  08/19/20 2050 138/75 -- 95 -- 99 % -- --  08/19/20 2045 -- 98.1 F (36.7 C) -- 18 -- 5' (1.524 m) 112 kg   General: NAD Heart: Regular rate Lungs: Normal rate and effort Abd: Soft, NT, Gravid, S=D Neuro: 2+ deep tendon reflexes, No clonus  Fetal Tracing:  Baseline: 135 Variability: moderate Accelerations: 15x15 Decelerations: none  Toco: irregular ctx   Results for orders placed or performed during the hospital encounter of 08/19/20 (from the past 24 hour(s))  CBC with Differential/Platelet     Status: Abnormal   Collection Time: 08/19/20  9:09 PM  Result Value Ref Range   WBC 9.2 4.0 - 10.5 K/uL   RBC 4.58 3.87 - 5.11 MIL/uL   Hemoglobin 10.4 (L) 12.0 - 15.0 g/dL   HCT 10.2 (L) 58.5 - 27.7 %   MCV 75.3 (L) 80.0 - 100.0 fL   MCH 22.7 (L) 26.0 - 34.0 pg   MCHC 30.1 30.0 - 36.0 g/dL   RDW 82.4 (H) 23.5 - 36.1 %   Platelets 298 150 - 400 K/uL   nRBC 0.0 0.0 - 0.2 %   Neutrophils Relative % 64  %   Neutro Abs 5.9 1.7 - 7.7 K/uL   Lymphocytes Relative 25 %   Lymphs Abs 2.3 0.7 - 4.0 K/uL   Monocytes Relative 10 %   Monocytes Absolute 0.9 0.1 - 1.0 K/uL   Eosinophils Relative 1 %   Eosinophils Absolute 0.1 0.0 - 0.5 K/uL   Basophils Relative 0 %   Basophils Absolute 0.0 0.0 - 0.1 K/uL   Immature Granulocytes 0 %   Abs Immature Granulocytes 0.04 0.00 - 0.07 K/uL  Comprehensive metabolic panel     Status: Abnormal   Collection Time: 08/19/20  9:09 PM  Result Value Ref Range   Sodium 134 (L) 135 - 145 mmol/L   Potassium 3.7 3.5 - 5.1 mmol/L   Chloride 106 98 - 111 mmol/L   CO2 21 (L) 22 - 32 mmol/L   Glucose, Bld 114 (H) 70 - 99 mg/dL   BUN 6 6 - 20 mg/dL   Creatinine, Ser 4.43 0.44 - 1.00 mg/dL   Calcium 8.8 (L) 8.9 - 10.3 mg/dL   Total Protein 6.0 (L) 6.5 - 8.1 g/dL   Albumin 2.6 (L)  3.5 - 5.0 g/dL   AST 25 15 - 41 U/L   ALT 15 0 - 44 U/L   Alkaline Phosphatase 177 (H) 38 - 126 U/L   Total Bilirubin 0.3 0.3 - 1.2 mg/dL   GFR, Estimated >33 >54 mL/min   Anion gap 7 5 - 15  Protein / creatinine ratio, urine     Status: None   Collection Time: 08/19/20  9:18 PM  Result Value Ref Range   Creatinine, Urine 111.69 mg/dL   Total Protein, Urine 8 mg/dL   Protein Creatinine Ratio 0.07 0.00 - 0.15 mg/mg[Cre]  Urinalysis, Routine w reflex microscopic Urine, Clean Catch     Status: Abnormal   Collection Time: 08/19/20  9:18 PM  Result Value Ref Range   Color, Urine YELLOW YELLOW   APPearance HAZY (A) CLEAR   Specific Gravity, Urine 1.013 1.005 - 1.030   pH 6.0 5.0 - 8.0   Glucose, UA NEGATIVE NEGATIVE mg/dL   Hgb urine dipstick NEGATIVE NEGATIVE   Bilirubin Urine NEGATIVE NEGATIVE   Ketones, ur NEGATIVE NEGATIVE mg/dL   Protein, ur NEGATIVE NEGATIVE mg/dL   Nitrite NEGATIVE NEGATIVE   Leukocytes,Ua NEGATIVE NEGATIVE  Type and screen [facility]     Status: None (Preliminary result)   Collection Time: 08/19/20 10:20 PM  Result Value Ref Range    ABO/RH(D) PENDING    Antibody Screen PENDING    Sample Expiration      08/22/2020,2359 Performed at [facility], [address] [place], [place] [address]     MDM Documented elevated BPs more than 4 hours apart gives patient diagnosis of gestational hypertension. Dr. Donavan Foil notified & will admit for IOL.   A:  1. Gestational hypertension, third trimester   2. [redacted] weeks gestation of pregnancy     P:  Admit to birthing suites for IOL for gestational hypertension at term Vertex per ultrasound today in MFM  Judeth Horn, NP 08/19/2020 10:38 PM

## 2020-08-19 NOTE — MAU Note (Signed)
Pt was at MFM today and was told B/P was high and that she should come to ED. She does have blurry vision and "I am catching a migraine" now. Also some pelvic pain.

## 2020-08-19 NOTE — H&P (Signed)
Deborah Singleton is a 25 y.o. female [phone] with IUP at [redacted]w[redacted]d presenting for IOL for GHTN  Prenatal History/Complications:  Had [place] at MFM today and BP was elevated; pt instructed to come to MAU for further evaluation.  Dx of GHTN is made, proceed w/IOL>   Hx 35 week PTD; not on Makena  Seizure disorder, on Keppra; last seizure August 2021   Past Medical History: Past Medical History:  Diagnosis Date   Miscarriage 06/02/2019   Seizures (HCC)    last seizure Aug 2021    Past Surgical History: Past Surgical History:  Procedure Laterality Date   TOOTH EXTRACTION      Obstetrical History: OB History     Gravida  4   Para  2   Term  1   Preterm  1   AB  1   Living  2      SAB      IAB  1   Ectopic      Multiple      Live Births  2           Social History: Social History   Socioeconomic History   Marital status: Single    Spouse name: Not on file   Number of children: 2   Years of education: Not on file   Highest education level: High school graduate  Occupational History   Not on file  Tobacco Use   Smoking status: Never   Smokeless tobacco: Never  Vaping Use   Vaping Use: Never used  Substance and Sexual Activity   Alcohol use: Not Currently   Drug use: No   Sexual activity: Yes    Birth control/protection: None  Other Topics Concern   Not on file  Social History Narrative   In [place], lives with children   Right handed   One story home   Drinks no caffeine   Social Determinants of Health   Financial Resource Strain: Not on file  Food Insecurity: No Food Insecurity   Worried About Programme researcher, broadcasting/film/video in the Last Year: Never true   Ran Out of Food in the Last Year: Never true  Transportation Needs: No Transportation Needs   Lack of Transportation (Medical): No   Lack of Transportation (Non-Medical): No  Physical Activity: Not on file  Stress: Not on file  Social Connections: Not on file    Family History: Family History   Problem Relation Age of Onset   Asthma Mother    Seizures Mother    Diabetes Father    Depression Sister    Diabetes Sister        younger sister   Cancer Maternal Aunt        on dad's side   Cancer Maternal Uncle        on dad's side    Allergies: Allergies  Allergen Reactions   Acetaminophen Itching    Patient states "whatever I was given when I went home after my last baby had tylenol and I had black spots over my legs."    Medications Prior to Admission  Medication Sig Dispense Refill Last Dose   aspirin EC 81 MG tablet Take 1 tablet (81 mg total) by mouth daily. Take after 12 weeks for prevention of preeclampsia later in pregnancy 300 tablet 0    cyclobenzaprine (FLEXERIL) 10 MG tablet Take 1 tablet (10 mg total) by mouth 2 (two) times daily as needed for muscle spasms. 20 tablet 2    folic  acid (FOLVITE) 1 MG tablet Take 4 tablets (4mg ) daily 120 tablet 11    levETIRAcetam (KEPPRA) 500 MG tablet Take 1/2 tablet twice a day for 3 days, then increase to 1 tablet twice a day 60 tablet 11    Prenatal Vit-Fe Fumarate-FA (PRENATAL MULTIVITAMIN) TABS tablet Take 1 tablet by mouth daily at 12 noon.           Review of Systems   Constitutional: Negative for fever and chills Eyes: Negative for visual disturbances Respiratory: Negative for shortness of breath, dyspnea Cardiovascular: Negative for chest pain or palpitations  Gastrointestinal: Negative for abdominal pain, vomiting, diarrhea and constipation.   Genitourinary: Negative for dysuria and urgency Musculoskeletal: Negative for back pain, joint pain, myalgias  Neurological: Negative for dizziness and headaches      Blood pressure (!) 147/94, pulse 97, temperature 98.8 F (37.1 C), temperature source Oral, resp. rate 14, height 5' (1.524 m), weight 112 kg, last menstrual period 11/06/2019, SpO2 99 %. General appearance: alert, cooperative, and no distress Lungs: normal respiratory effort Heart: regular rate and  rhythm Abdomen: soft, non-tender; bowel sounds normal Extremities: Homans sign is negative, no sign of DVT DTR's 2+ Presentation: cephalic Fetal monitoring  Baseline: 150 bpm, Variability: Good {> 6 bpm), Accelerations: Reactive, and Decelerations: Absent Uterine activity  rare  Dilation: 1.5 Effacement (%): 70 Station: -2 Exam by:: [email or web address], CNM   Prenatal labs: ABO, Rh: --/--/O POS (07/21 2220) Antibody: NEG (07/21 2220) Rubella: immune RPR: Non Reactive (05/24 0924)  HBsAg: Negative (01/27 1034)  HIV: Non Reactive (05/24 0924)  GBS: Negative/-- (07/07 1120)    Nursing Staff Provider  Office Location  [facility] Dating   09-10-1981  Language  English Anatomy US  F/u in 4 weeks,  EDC changed per MFM  Flu Vaccine  02/26/20 Genetic Screen  NIPS: low risk female  AFP: negative   TDaP Vaccine  08/16/20  Hgb A1C or  GTT Early A1c 5.3 Third trimester   COVID Vaccine Yes   LAB RESULTS   Rhogam  NA Blood Type O/Positive/-- (01/27 1034)   Feeding Plan Breast Antibody Negative (01/27 1034)  Contraception BTL Rubella 3.03 (01/27 1034)  Circumcision Yes RPR Non Reactive (01/27 1034)   Pediatrician   HBsAg Negative (01/27 1034)   Support Person Family HCVAb  Non reactive  Prenatal Classes  HIV Non Reactive (01/27 1034)     BTL Consent 06/22/20 GBS  Negative (For PCN allergy, check sensitivities)   VBAC Consent NA Pap 02/26/20- negative    Hgb Electro    BP [facility]-02/26/20 CF     SMA     Waterbirth  [ ]  Class [ ]  Consent [ ]  CNM visit    Induction  [ ]  Orders Entered [ ] Foley Y/N    Prenatal Transfer Tool  Maternal Diabetes: No Genetic Screening: Normal Maternal Ultrasounds/Referrals: Normal Fetal Ultrasounds or other Referrals:  None Maternal Substance Abuse:  No Significant Maternal Medications:  None Significant Maternal Lab Results: Group B Strep negative    Results for orders placed or performed during the hospital encounter of 08/19/20 (from the past 24 hour(s))  CBC  with Differential/Platelet   Collection Time: 08/19/20  9:09 PM  Result Value Ref Range   WBC 9.2 4.0 - 10.5 K/uL   RBC 4.58 3.87 - 5.11 MIL/uL   Hemoglobin 10.4 (L) 12.0 - 15.0 g/dL   HCT (L) - %   MCV 75.3 (L) 80.0 - 100.0 fL  MCH 22.7 (L) 26.0 - 34.0 pg   MCHC 30.1 30.0 - 36.0 g/dL   RDW 54.6 (H) 27.0 - 35.0 %   Platelets 298 150 - 400 K/uL   nRBC 0.0 0.0 - 0.2 %   Neutrophils Relative % 64 %   Neutro Abs 5.9 1.7 - 7.7 K/uL   Lymphocytes Relative 25 %   Lymphs Abs 2.3 0.7 - 4.0 K/uL   Monocytes Relative 10 %   Monocytes Absolute 0.9 0.1 - 1.0 K/uL   Eosinophils Relative 1 %   Eosinophils Absolute 0.1 0.0 - 0.5 K/uL   Basophils Relative 0 %   Basophils Absolute 0.0 0.0 - 0.1 K/uL   Immature Granulocytes 0 %   Abs Immature Granulocytes 0.04 0.00 - 0.07 K/uL  Comprehensive metabolic panel   Collection Time: 08/19/20  9:09 PM  Result Value Ref Range   Sodium 134 (L) 135 - 145 mmol/L   Potassium 3.7 3.5 - 5.1 mmol/L   Chloride 106 98 - 111 mmol/L   CO2 21 (L) 22 - 32 mmol/L   Glucose, Bld 114 (H) 70 - 99 mg/dL   BUN 6 6 - 20 mg/dL   Creatinine, Ser 0.93 0.44 - 1.00 mg/dL   Calcium 8.8 (L) 8.9 - 10.3 mg/dL   Total Protein 6.0 (L) 6.5 - 8.1 g/dL   Albumin 2.6 (L) 3.5 - 5.0 g/dL   AST 25 15 - 41 U/L   ALT 15 0 - 44 U/L   Alkaline Phosphatase 177 (H) 38 - 126 U/L   Total Bilirubin 0.3 0.3 - 1.2 mg/dL   GFR, Estimated >81 >82 mL/min   Anion gap 7 5 - 15  Protein / creatinine ratio, urine   Collection Time: 08/19/20  9:18 PM  Result Value Ref Range   Creatinine, Urine 111.69 mg/dL   Total Protein, Urine 8 mg/dL   Protein Creatinine Ratio 0.07 0.00 - 0.15 mg/mg[Cre]  Urinalysis, Routine w reflex microscopic Urine, Clean Catch   Collection Time: 08/19/20  9:18 PM  Result Value Ref Range   Color, Urine YELLOW YELLOW   APPearance HAZY (A) CLEAR   Specific Gravity, Urine 1.013 1.005 - 1.030   pH 6.0 5.0 - 8.0   Glucose, UA NEGATIVE NEGATIVE mg/dL   Hgb  urine dipstick NEGATIVE NEGATIVE   Bilirubin Urine NEGATIVE NEGATIVE   Ketones, ur NEGATIVE NEGATIVE mg/dL   Protein, ur NEGATIVE NEGATIVE mg/dL   Nitrite NEGATIVE NEGATIVE   Leukocytes,Ua NEGATIVE NEGATIVE  Type and screen [facility]   Collection Time: 08/19/20 10:20 PM  Result Value Ref Range   ABO/RH(D) O POS    Antibody Screen NEG    Sample Expiration      08/22/2020,2359 Performed at [facility], [address], [place], [place] [address]     Assessment: Deborah Singleton is a 25 y.o. [phone] with an IUP at [redacted]w[redacted]d presenting for IOL for GHTN.  Plan: #Labor: Foley inserted and inflated w/80cc H20;  will start pitocin when it falls out. pitocin #Pain:  Per request #FWB Cat 1 #ID: GBS: neg  # GHTN:  HA/blurred vision were temporary and resolved w/o intervention. Preeclampsia labs neg  Jacklyn Shell 08/19/2020, 11:16 PM

## 2020-08-20 ENCOUNTER — Encounter (HOSPITAL_COMMUNITY): Payer: Self-pay | Admitting: Obstetrics and Gynecology

## 2020-08-20 ENCOUNTER — Inpatient Hospital Stay (HOSPITAL_COMMUNITY): Payer: Medicaid Other | Admitting: Anesthesiology

## 2020-08-20 DIAGNOSIS — D649 Anemia, unspecified: Secondary | ICD-10-CM | POA: Diagnosis not present

## 2020-08-20 DIAGNOSIS — O134 Gestational [pregnancy-induced] hypertension without significant proteinuria, complicating childbirth: Secondary | ICD-10-CM | POA: Diagnosis not present

## 2020-08-20 DIAGNOSIS — O9902 Anemia complicating childbirth: Secondary | ICD-10-CM | POA: Diagnosis not present

## 2020-08-20 DIAGNOSIS — Z3A38 38 weeks gestation of pregnancy: Secondary | ICD-10-CM | POA: Diagnosis not present

## 2020-08-20 LAB — CBC
HCT: 34.6 % — ABNORMAL LOW (ref 36.0–46.0)
Hemoglobin: 10.1 g/dL — ABNORMAL LOW (ref 12.0–15.0)
MCH: 22.2 pg — ABNORMAL LOW (ref 26.0–34.0)
MCHC: 29.2 g/dL — ABNORMAL LOW (ref 30.0–36.0)
MCV: 76.2 fL — ABNORMAL LOW (ref 80.0–100.0)
Platelets: 284 10*3/uL (ref 150–400)
RBC: 4.54 MIL/uL (ref 3.87–5.11)
RDW: 16.1 % — ABNORMAL HIGH (ref 11.5–15.5)
WBC: 9 10*3/uL (ref 4.0–10.5)
nRBC: 0 % (ref 0.0–0.2)

## 2020-08-20 LAB — COMPREHENSIVE METABOLIC PANEL
ALT: 15 U/L (ref 0–44)
AST: 23 U/L (ref 15–41)
Albumin: 2.4 g/dL — ABNORMAL LOW (ref 3.5–5.0)
Alkaline Phosphatase: 184 U/L — ABNORMAL HIGH (ref 38–126)
Anion gap: 6 (ref 5–15)
BUN: <5 mg/dL — ABNORMAL LOW (ref 6–20)
CO2: 21 mmol/L — ABNORMAL LOW (ref 22–32)
Calcium: 8.7 mg/dL — ABNORMAL LOW (ref 8.9–10.3)
Chloride: 107 mmol/L (ref 98–111)
Creatinine, Ser: 0.68 mg/dL (ref 0.44–1.00)
GFR, Estimated: >60 mL/min (ref >60)
Glucose, Bld: 101 mg/dL — ABNORMAL HIGH (ref 70–99)
Potassium: 3.6 mmol/L (ref 3.5–5.1)
Sodium: 134 mmol/L — ABNORMAL LOW (ref 135–145)
Total Bilirubin: 0.5 mg/dL (ref 0.3–1.2)
Total Protein: 5.8 g/dL — ABNORMAL LOW (ref 6.5–8.1)

## 2020-08-20 LAB — RESP PANEL BY RT-PCR (FLU A&B, COVID) ARPGX2
Influenza A by PCR: NEGATIVE
Influenza B by PCR: NEGATIVE
SARS Coronavirus 2 by RT PCR: NEGATIVE

## 2020-08-20 LAB — RPR: RPR Ser Ql: NONREACTIVE

## 2020-08-20 MED ORDER — PHENYLEPHRINE HCL (PRESSORS) 10 MG/ML IV SOLN
INTRAVENOUS | Status: DC | PRN
Start: 2020-08-20 — End: 2020-08-20
  Administered 2020-08-20: 120 ug via INTRAVENOUS

## 2020-08-20 MED ORDER — WITCH HAZEL-GLYCERIN EX PADS
1.0000 | MEDICATED_PAD | CUTANEOUS | Status: DC | PRN
Start: 2020-08-20 — End: 2020-08-22

## 2020-08-20 MED ORDER — FENTANYL CITRATE (PF) 100 MCG/2ML IJ SOLN
INTRAMUSCULAR | Status: DC | PRN
  Administered 2020-08-20: 100 ug via EPIDURAL

## 2020-08-20 MED ORDER — SIMETHICONE 80 MG PO CHEW
80.0000 mg | CHEWABLE_TABLET | ORAL | Status: DC | PRN
  Administered 2020-08-21: 80 mg via ORAL
  Filled 2020-08-20: qty 1

## 2020-08-20 MED ORDER — SENNOSIDES-DOCUSATE SODIUM 8.6-50 MG PO TABS
2.0000 | ORAL_TABLET | Freq: Every day | ORAL | Status: DC
Start: 2020-08-21 — End: 2020-08-22
  Administered 2020-08-22: 2 via ORAL
  Filled 2020-08-20: qty 2

## 2020-08-20 MED ORDER — EPHEDRINE 5 MG/ML INJ: 10.0000 mg | INTRAVENOUS | Status: DC | PRN

## 2020-08-20 MED ORDER — PHENYLEPHRINE 40 MCG/ML (10ML) SYRINGE FOR IV PUSH (FOR BLOOD PRESSURE SUPPORT)
80.0000 ug | PREFILLED_SYRINGE | INTRAVENOUS | Status: DC | PRN
  Administered 2020-08-20: 80 ug via INTRAVENOUS

## 2020-08-20 MED ORDER — LACTATED RINGERS IV SOLN
500.0000 mL | Freq: Once | INTRAVENOUS | Status: DC
  Administered 2020-08-20: 500 mL via INTRAVENOUS

## 2020-08-20 MED ORDER — ZOLPIDEM TARTRATE 5 MG PO TABS: 5.0000 mg | ORAL_TABLET | Freq: Every evening | ORAL | Status: DC | PRN

## 2020-08-20 MED ORDER — OXYCODONE HCL 5 MG PO TABS
5.0000 mg | ORAL_TABLET | Freq: Once | ORAL | Status: AC
  Administered 2020-08-20: 5 mg via ORAL
  Filled 2020-08-20: qty 1

## 2020-08-20 MED ORDER — BENZOCAINE-MENTHOL 20-0.5 % EX AERO
1.0000 "application " | INHALATION_SPRAY | CUTANEOUS | Status: DC | PRN
  Administered 2020-08-21: 1 via TOPICAL
  Filled 2020-08-20: qty 56

## 2020-08-20 MED ORDER — FENTANYL-BUPIVACAINE-NACL 0.5-0.125-0.9 MG/250ML-% EP SOLN
EPIDURAL | Status: DC | PRN
  Administered 2020-08-20: 12 mL/h via EPIDURAL

## 2020-08-20 MED ORDER — LIDOCAINE HCL (PF) 1 % IJ SOLN
INTRAMUSCULAR | Status: DC | PRN
  Administered 2020-08-20: 2 mL via EPIDURAL
  Administered 2020-08-20: 10 mL via EPIDURAL

## 2020-08-20 MED ORDER — TETANUS-DIPHTH-ACELL PERTUSSIS 5-2.5-18.5 LF-MCG/0.5 IM SUSY
0.5000 mL | PREFILLED_SYRINGE | Freq: Once | INTRAMUSCULAR | Status: DC
Start: 2020-08-21 — End: 2020-08-22

## 2020-08-20 MED ORDER — PRENATAL MULTIVITAMIN CH
1.0000 | ORAL_TABLET | Freq: Every day | ORAL | Status: DC
  Administered 2020-08-21 – 2020-08-22 (×2): 1 via ORAL
  Filled 2020-08-20 (×2): qty 1

## 2020-08-20 MED ORDER — MISOPROSTOL 50MCG HALF TABLET
50.0000 ug | ORAL_TABLET | ORAL | Status: DC
  Administered 2020-08-20: 50 ug via BUCCAL
  Filled 2020-08-20: qty 1

## 2020-08-20 MED ORDER — ONDANSETRON HCL 4 MG/2ML IJ SOLN
4.0000 mg | INTRAMUSCULAR | Status: DC | PRN
Start: 2020-08-20 — End: 2020-08-22

## 2020-08-20 MED ORDER — DIPHENHYDRAMINE HCL 25 MG PO CAPS: 25.0000 mg | ORAL_CAPSULE | Freq: Four times a day (QID) | ORAL | Status: DC | PRN

## 2020-08-20 MED ORDER — PHENYLEPHRINE 40 MCG/ML (10ML) SYRINGE FOR IV PUSH (FOR BLOOD PRESSURE SUPPORT)
80.0000 ug | PREFILLED_SYRINGE | INTRAVENOUS | Status: DC | PRN
  Filled 2020-08-20 (×2): qty 10

## 2020-08-20 MED ORDER — ONDANSETRON HCL 4 MG PO TABS
4.0000 mg | ORAL_TABLET | ORAL | Status: DC | PRN
Start: 2020-08-20 — End: 2020-08-22

## 2020-08-20 MED ORDER — COCONUT OIL OIL: 1.0000 "application " | TOPICAL_OIL | Status: DC | PRN

## 2020-08-20 MED ORDER — FENTANYL-BUPIVACAINE-NACL 0.5-0.125-0.9 MG/250ML-% EP SOLN
12.0000 mL/h | EPIDURAL | Status: DC | PRN
  Filled 2020-08-20: qty 250

## 2020-08-20 MED ORDER — DIBUCAINE (PERIANAL) 1 % EX OINT: 1.0000 "application " | TOPICAL_OINTMENT | CUTANEOUS | Status: DC | PRN

## 2020-08-20 MED ORDER — IBUPROFEN 600 MG PO TABS
600.0000 mg | ORAL_TABLET | Freq: Four times a day (QID) | ORAL | Status: DC
  Administered 2020-08-21 – 2020-08-22 (×6): 600 mg via ORAL
  Filled 2020-08-20 (×6): qty 1

## 2020-08-20 MED ORDER — LEVETIRACETAM 500 MG PO TABS
500.0000 mg | ORAL_TABLET | Freq: Two times a day (BID) | ORAL | Status: DC
  Administered 2020-08-20 – 2020-08-21 (×3): 500 mg via ORAL
  Filled 2020-08-20 (×4): qty 1

## 2020-08-20 MED ORDER — DIPHENHYDRAMINE HCL 50 MG/ML IJ SOLN
12.5000 mg | INTRAMUSCULAR | Status: DC | PRN
Start: 2020-08-20 — End: 2020-08-20

## 2020-08-20 NOTE — Consult Note (Signed)
   [facility] CM Inpatient Consult   08/20/2020  Dynasti Mccance 09-06-1995 [id number]  Managed Medicaid: Healthy Blue plan  Patient is showing as in a pending status for referral.  Plan:  Will make another referral for Managed Medicaid team post hospital follow up closer to transition date, as appropriate.  For questions, please contact:  Charlesetta Shanks, RN BSN CCM [facility]  [phone] business mobile phone Toll free office [phone]  Fax number: [phone] [email or web address]

## 2020-08-20 NOTE — Progress Notes (Signed)
Patient Vitals for the past 4 hrs:  BP Pulse Resp  08/20/20 0235 124/68 77 --  08/20/20 0132 128/79 88 14   Foley out, cx, 4/70/-3>  FHR Cat 1, ctx mild and irregular. Will start Pitocin per low dose protocol.

## 2020-08-20 NOTE — Progress Notes (Signed)
Deborah Singleton is a 25 y.o. [id number] at [redacted]w[redacted]d   Subjective: Mild headache, s/p Roxicodone at [phone]. Believes it is associated with hunger. Denies visual disturbances.  Objective: BP (!) 107/44   Pulse 79   Temp 98.7 F (37.1 C) (Oral)   Resp 16   Ht 5' (1.524 m)   Wt 112 kg   LMP 11/06/2019   SpO2 99%   BMI 48.24 kg/m  No intake/output data recorded. No intake/output data recorded.  FHT:  FHR: 125 bpm, variability: moderate,  accelerations:  Present,  decelerations:  Absent UC:   regular, every 2-3 minutes SVE:   Dilation: 4.5 Effacement (%): 70 Station: -3 Exam by:: Sandford Craze, RN/Casey Perry, RN  Labs: Lab Results  Component Value Date   WBC 9.2 08/19/2020   HGB 10.4 (L) 08/19/2020   HCT 34.5 (L) 08/19/2020   MCV 75.3 (L) 08/19/2020   PLT 298 08/19/2020    Assessment / Plan: --25 y.o. [id number] at [redacted]w[redacted]d IOL GHTN --GBS Neg --Reactive tracing --S/p Cook catheter --Pitocin infusing at 14 milliunits --Cervix very posterior, almost unreachable, not a candidate for AROM --Cephalic presentation confirmed with BSUS --Continue Pitocin titration --Anticipate vaginal delivery  Calvert Cantor, CNM 08/20/2020, 9:18 AM

## 2020-08-20 NOTE — Progress Notes (Signed)
Labor Progress Note Deborah Singleton is a 25 y.o. [id number] at [redacted]w[redacted]d presented for IOL for gestational hypertension  S:  Patient resting. States headache is worse and medication did not help. Denies visual changes or epigastric pain  O:  BP (!) 103/51   Pulse 78   Temp 98.7 F (37.1 C) (Oral)   Resp 16   Ht 5' (1.524 m)   Wt 112 kg   LMP 11/06/2019   SpO2 99%   BMI 48.24 kg/m   Fetal Tracing:  Baseline: 120 Variability: moderate Accels: 15x15 Decels: none  Toco: occasional uc's   CVE: Dilation: 4.5 Effacement (%): 50 Cervical Position: Posterior Station: Ballotable Presentation: Vertex Exam by:: sam weinhold,cnm   A&P: 25 y.o. [id number] [redacted]w[redacted]d IOL GHTN #Labor: Cervix unchanged, almost unreachable still. Pitocin stopped and will give buccal cytotec. Patient may eat as she feels her headache is from hunger but will repeat preeclampsia labs.  #Pain: per patient request #FWB: Cat 1 #GBS negative  Rolm Bookbinder, CNM 1:21 PM

## 2020-08-20 NOTE — Anesthesia Preprocedure Evaluation (Addendum)
Anesthesia Evaluation  Patient identified by MRN, date of birth, ID band Patient awake    Reviewed: Allergy & Precautions, Patient's Chart, lab work & pertinent test results  Airway Mallampati: II  TM Distance: >3 FB Neck ROM: Full    Dental no notable dental hx.    Pulmonary neg pulmonary ROS,    Pulmonary exam normal breath sounds clear to auscultation       Cardiovascular hypertension (gHTN), Normal cardiovascular exam Rhythm:Regular Rate:Normal     Neuro/Psych Seizures -,  negative psych ROS   GI/Hepatic negative GI ROS, Neg liver ROS, BMI 48   Endo/Other    Renal/GU negative Renal ROS  negative genitourinary   Musculoskeletal negative musculoskeletal ROS (+)   Abdominal (+) + obese,   Peds negative pediatric ROS (+)  Hematology  (+) Blood dyscrasia, anemia , hct 34.6, plt 284   Anesthesia Other Findings   Reproductive/Obstetrics (+) Pregnancy                             Anesthesia Physical Anesthesia Plan  ASA: 3  Anesthesia Plan: Epidural   Post-op Pain Management:    Induction:   PONV Risk Score and Plan: 2  Airway Management Planned: Natural Airway  Additional Equipment: None  Intra-op Plan:   Post-operative Plan:   Informed Consent: I have reviewed the patients History and Physical, chart, labs and discussed the procedure including the risks, benefits and alternatives for the proposed anesthesia with the patient or authorized representative who has indicated his/her understanding and acceptance.       Plan Discussed with: CRNA and Anesthesiologist  Anesthesia Plan Comments: (Patient states epidural worked really well for labor/delivery. Will plan to dose epidural with lido for surgical anesthesia for tubal. Backup plan spinal vs. GETA. Ques answered, consent signed. Patient agrees with plan and wishes to proceed. Tanna Furry, MD  )       Anesthesia Quick  Evaluation

## 2020-08-20 NOTE — Anesthesia Procedure Notes (Signed)
Epidural Patient location during procedure: OB Start time: 08/20/2020 8:07 PM End time: 08/20/2020 8:19 PM  Staffing Anesthesiologist: Lannie Fields, DO Performed: anesthesiologist   Preanesthetic Checklist Completed: patient identified, IV checked, risks and benefits discussed, monitors and equipment checked, pre-op evaluation and timeout performed  Epidural Patient position: sitting Prep: DuraPrep and site prepped and draped Patient monitoring: continuous pulse ox, blood pressure, heart rate and cardiac monitor Approach: midline Location: L3-L4 Injection technique: LOR air  Needle:  Needle type: Tuohy  Needle gauge: 17 G Needle length: 9 cm Needle insertion depth: 8 cm Catheter type: closed end flexible Catheter size: 19 Gauge Catheter at skin depth: 13 cm Test dose: negative  Assessment Sensory level: T8 Events: blood not aspirated, injection not painful, no injection resistance, no paresthesia and negative IV test  Additional Notes Patient identified. Risks/Benefits/Options discussed with patient including but not limited to bleeding, infection, nerve damage, paralysis, failed block, incomplete pain control, headache, blood pressure changes, nausea, vomiting, reactions to medication both or allergic, itching and postpartum back pain. Confirmed with bedside nurse the patient's most recent platelet count. Confirmed with patient that they are not currently taking any anticoagulation, have any bleeding history or any family history of bleeding disorders. Patient expressed understanding and wished to proceed. All questions were answered. Sterile technique was used throughout the entire procedure. Please see nursing notes for vital signs. Test dose was given through epidural catheter and negative prior to continuing to dose epidural or start infusion. Warning signs of high block given to the patient including shortness of breath, tingling/numbness in hands, complete motor  block, or any concerning symptoms with instructions to call for help. Patient was given instructions on fall risk and not to get out of bed. All questions and concerns addressed with instructions to call with any issues or inadequate analgesia.  Reason for block:procedure for pain

## 2020-08-20 NOTE — Progress Notes (Signed)
Labor Progress Note Deborah Singleton is a 25 y.o. [id number] at [redacted]w[redacted]d presented for IOL for gHTN  S:  Patient resting comfortably, headache is improved since eating.   O:  BP (!) 153/79   Pulse 88   Temp 98.7 F (37.1 C) (Oral)   Resp 16   Ht 5' (1.524 m)   Wt 112 kg   LMP 11/06/2019   SpO2 99%   BMI 48.24 kg/m   Fetal Tracing:  Baseline: 120 Variability: moderate Accels: 15x15 Decels: none  Toco: none   CVE: Dilation: 5 Effacement (%): 50 Cervical Position: Posterior Station: Ballotable Presentation: Vertex Exam by:: eure   A&P: 25 y.o. [id number] 100w1d IOL gHTN #Labor: Progressing well. Discussed with patient risks and benefits of AROM for augmentation of labor. Patient agreeable to plan of care. AROM by Dr. Despina Hidden with small amount of clear fluid. Patient and FHR tolerated procedure well.   Will restart pitocin at this time  #Pain: per patient request #FWB: Cat 1 #GBS negative  Rolm Bookbinder, CNM 6:30 PM

## 2020-08-20 NOTE — Discharge Summary (Signed)
Postpartum Discharge Summary     Patient Name: Deborah Singleton DOB: 08-Dec-1995 MRN: [id number]  Date of admission: 08/19/2020 Delivery date:08/20/2020  Delivering provider: Renee Harder  Date of discharge: 08/22/2020  Admitting diagnosis: Gestational hypertension, third trimester [O13.3] Intrauterine pregnancy: [redacted]w[redacted]d    Secondary diagnosis:  Active Problems:   Gestational hypertension, third trimester  Additional problems: BTL    Discharge diagnosis: Term Pregnancy Delivered                                              Post partum procedures:postpartum tubal ligation Augmentation: AROM, Pitocin, Cytotec, and IP Foley Complications: None  Hospital course: Induction of Labor With Vaginal Delivery   25y.o. yo [id number] 318w1das admitted to the hospital 08/19/2020 for induction of labor.  Indication for induction: Gestational hypertension.  Patient had an uncomplicated labor course as follows: Membrane Rupture Time/Date: 6:28 PM ,08/20/2020   Delivery Method:Vaginal, Spontaneous  Episiotomy: None  Lacerations:  None  Details of delivery can be found in separate delivery note.  Patient had a routine postpartum course. Patient is discharged home 08/22/20.  Newborn Data: Birth date:08/20/2020  Birth time:9:06 PM  Gender:Female  Living status:Living  Apgars:7 ,9  Weight:2895 g   Magnesium Sulfate received: No BMZ received: No Rhophylac:N/A MMR:N/A T-DaP:Given prenatally Flu: N/A Transfusion:No  Physical exam  Vitals:   08/21/20 1830 08/21/20 2217 08/22/20 0252 08/22/20 0539  BP: 135/84 117/60 129/69 115/67  Pulse: 91 88 90 91  Resp: _0 Temp: (!) 97.3 F (36.3 C) 98.1 F (36.7 C) 98 F (36.7 C) 98.1 F (36.7 C)  TempSrc: Axillary Axillary Axillary Oral  SpO2: 99% 98% 99% 98%  Weight:      Height:       General: alert, cooperative, and no distress Lochia: appropriate Uterine Fundus: firm Incision: Dressing is clean, dry, and intact DVT Evaluation:  No evidence of DVT seen on physical exam. Pt with bowel movement since delivery.  Labs: Lab Results  Component Value Date   WBC 9.0 08/20/2020   HGB 10.1 (L) 08/20/2020   HCT 34.6 (L) 08/20/2020   MCV 76.2 (L) 08/20/2020   PLT 284 08/20/2020   CMP Latest Ref Rng & Units 08/20/2020  Glucose 70 - 99 mg/dL 101(H)  BUN 6 - 20 mg/dL <5(L)  Creatinine 0.44 - 1.00 mg/dL 0.68  Sodium 135 - 145 mmol/L 134(L)  Potassium 3.5 - 5.1 mmol/L 3.6  Chloride 98 - 111 mmol/L 107  CO2 22 - 32 mmol/L 21(L)  Calcium 8.9 - 10.3 mg/dL 8.7(L)  Total Protein 6.5 - 8.1 g/dL 5.8(L)  Total Bilirubin 0.3 - 1.2 mg/dL 0.5  Alkaline Phos 38 - 126 U/L 184(H)  AST 15 - 41 U/L 23  ALT 0 - 44 U/L 15   Edinburgh Score: Edinburgh Postnatal Depression Scale Screening Tool 08/22/2020  I have been able to laugh and see the funny side of things. 0  I have looked forward with enjoyment to things. 0  I have blamed myself unnecessarily when things went wrong. 0  I have been anxious or worried for no good reason. 0  I have felt scared or panicky for no good reason. 0  Things have been getting on top of me. 0  I have been so unhappy that I have had difficulty sleeping. 0  I have  felt sad or miserable. 0  I have been so unhappy that I have been crying. 0  The thought of harming myself has occurred to me. 0  Edinburgh Postnatal Depression Scale Total 0     After visit meds:  Allergies as of 08/22/2020       Reactions   Acetaminophen Itching   Patient states "whatever I was given when I went home after my last baby had tylenol and I had black spots over my legs."        Medication List     STOP taking these medications    aspirin EC 81 MG tablet   cyclobenzaprine 10 MG tablet Commonly known as: FLEXERIL   folic acid 1 MG tablet Commonly known as: FOLVITE       TAKE these medications    ibuprofen 600 MG tablet Commonly known as: ADVIL Take 1 tablet (600 mg total) by mouth every 6 (six) hours.    levETIRAcetam 500 MG tablet Commonly known as: KEPPRA Take 1/2 tablet twice a day for 3 days, then increase to 1 tablet twice a day   oxyCODONE 5 MG immediate release tablet Commonly known as: Oxy IR/ROXICODONE Take 1 tablet (5 mg total) by mouth every 6 (six) hours as needed for moderate pain, severe pain or breakthrough pain.   prenatal multivitamin Tabs tablet Take 1 tablet by mouth daily at 12 noon.   senna-docusate 8.6-50 MG tablet Commonly known as: Senokot-S Take 2 tablets by mouth daily. Start taking on: August 23, 2020        Discharge home in stable condition Infant Feeding: Bottle and Breast Infant Disposition:home with mother Discharge instruction: per After Visit Summary and Postpartum booklet. Activity: Advance as tolerated. Pelvic rest for 6 weeks.  Diet: routine diet Future Appointments: Future Appointments  Date Time Provider Department Center  08/24/2020  8:15 AM [facility]  08/24/2020  9:15 AM Eckstat, Matthew M, MD [facility]  02/07/2021  3:30 PM Aquino, Karen M, MD [facility] None   Follow up Visit:  Follow-up Information     [facility]. Schedule an appointment as soon as possible for a visit in 1 week(s).   Specialty: Obstetrics and Gynecology Why: BP check/incision check 4 weeks for PP visit Contact information: [address] [place] [address] [phone]                Message sent by Danielle Simpson on 08/20/2020 Please schedule this patient for a In person postpartum visit in 4 weeks with the following provider: Any provider. Additional Postpartum F/U:Incision check 1 week and BP check 1 week  High risk pregnancy complicated by: HTN Delivery mode:  Vaginal, Spontaneous  Anticipated Birth Control:  BTL done PP   08/22/2020 Nicole E Nugent, NP    

## 2020-08-21 ENCOUNTER — Encounter (HOSPITAL_COMMUNITY)
Admission: AD | Disposition: A | Discharge status: Home / Self Care | Payer: Self-pay | Source: Home / Self Care | Attending: Obstetrics & Gynecology

## 2020-08-21 ENCOUNTER — Inpatient Hospital Stay (HOSPITAL_COMMUNITY): Payer: Medicaid Other | Admitting: Anesthesiology

## 2020-08-21 ENCOUNTER — Encounter (HOSPITAL_COMMUNITY): Payer: Self-pay | Admitting: Obstetrics and Gynecology

## 2020-08-21 DIAGNOSIS — Z302 Encounter for sterilization: Secondary | ICD-10-CM | POA: Diagnosis not present

## 2020-08-21 HISTORY — PX: TUBAL LIGATION: SHX77

## 2020-08-21 SURGERY — LIGATION, FALLOPIAN TUBE, POSTPARTUM
Anesthesia: Epidural | Laterality: Bilateral

## 2020-08-21 SURGERY — LIGATION, FALLOPIAN TUBE, POSTPARTUM
Anesthesia: Epidural

## 2020-08-21 SURGERY — LIGATION, FALLOPIAN TUBE, BILATERAL
Anesthesia: Regional | Laterality: Bilateral

## 2020-08-21 MED ORDER — KETOROLAC TROMETHAMINE 30 MG/ML IJ SOLN
INTRAMUSCULAR | Status: AC
  Filled 2020-08-21: qty 1

## 2020-08-21 MED ORDER — DEXMEDETOMIDINE (PRECEDEX) IN NS 20 MCG/5ML (4 MCG/ML) IV SYRINGE
PREFILLED_SYRINGE | INTRAVENOUS | Status: AC
  Filled 2020-08-21: qty 5

## 2020-08-21 MED ORDER — DEXMEDETOMIDINE (PRECEDEX) IN NS 20 MCG/5ML (4 MCG/ML) IV SYRINGE
PREFILLED_SYRINGE | INTRAVENOUS | Status: DC | PRN
  Administered 2020-08-21 (×2): 4 ug via INTRAVENOUS

## 2020-08-21 MED ORDER — HYDROMORPHONE HCL 1 MG/ML IJ SOLN: 1.0000 mg | INTRAMUSCULAR | Status: DC | PRN

## 2020-08-21 MED ORDER — ONDANSETRON HCL 4 MG/2ML IJ SOLN
INTRAMUSCULAR | Status: DC | PRN
  Administered 2020-08-21: 4 mg via INTRAVENOUS

## 2020-08-21 MED ORDER — BUPIVACAINE HCL (PF) 0.25 % IJ SOLN
INTRAMUSCULAR | Status: DC | PRN
  Administered 2020-08-21: 30 mL

## 2020-08-21 MED ORDER — LACTATED RINGERS IV SOLN
INTRAVENOUS | Status: DC
  Administered 2020-08-21: 1000 mL via INTRAVENOUS

## 2020-08-21 MED ORDER — FENTANYL CITRATE (PF) 100 MCG/2ML IJ SOLN
INTRAMUSCULAR | Status: DC | PRN
  Administered 2020-08-21: 100 ug via EPIDURAL

## 2020-08-21 MED ORDER — PROMETHAZINE HCL 25 MG/ML IJ SOLN: 6.2500 mg | INTRAMUSCULAR | Status: DC | PRN

## 2020-08-21 MED ORDER — MIDAZOLAM HCL 2 MG/2ML IJ SOLN
INTRAMUSCULAR | Status: AC
  Filled 2020-08-21: qty 2

## 2020-08-21 MED ORDER — KETOROLAC TROMETHAMINE 30 MG/ML IJ SOLN
INTRAMUSCULAR | Status: DC | PRN
  Administered 2020-08-21: 30 mg via INTRAVENOUS

## 2020-08-21 MED ORDER — FENTANYL CITRATE (PF) 100 MCG/2ML IJ SOLN: 25.0000 ug | INTRAMUSCULAR | Status: DC | PRN

## 2020-08-21 MED ORDER — OXYCODONE HCL 5 MG PO TABS: 5.0000 mg | ORAL_TABLET | Freq: Once | ORAL | Status: DC | PRN

## 2020-08-21 MED ORDER — METOCLOPRAMIDE HCL 10 MG PO TABS
10.0000 mg | ORAL_TABLET | Freq: Once | ORAL | Status: AC
  Administered 2020-08-21: 10 mg via ORAL
  Filled 2020-08-21: qty 1

## 2020-08-21 MED ORDER — LEVETIRACETAM 500 MG PO TABS
500.0000 mg | ORAL_TABLET | Freq: Two times a day (BID) | ORAL | Status: DC
  Administered 2020-08-22: 500 mg via ORAL
  Filled 2020-08-21 (×4): qty 1

## 2020-08-21 MED ORDER — MIDAZOLAM HCL 2 MG/2ML IJ SOLN
INTRAMUSCULAR | Status: DC | PRN
  Administered 2020-08-21 (×2): 1 mg via INTRAVENOUS

## 2020-08-21 MED ORDER — LACTATED RINGERS IV SOLN: INTRAVENOUS | Status: DC | PRN

## 2020-08-21 MED ORDER — OXYCODONE HCL 5 MG/5ML PO SOLN
5.0000 mg | Freq: Once | ORAL | Status: DC | PRN
Start: 2020-08-21 — End: 2020-08-21

## 2020-08-21 MED ORDER — BUPIVACAINE HCL (PF) 0.25 % IJ SOLN
INTRAMUSCULAR | Status: AC
  Filled 2020-08-21: qty 30

## 2020-08-21 MED ORDER — FAMOTIDINE 20 MG PO TABS
40.0000 mg | ORAL_TABLET | Freq: Once | ORAL | Status: AC
  Administered 2020-08-21: 40 mg via ORAL
  Filled 2020-08-21: qty 2

## 2020-08-21 MED ORDER — OXYCODONE-ACETAMINOPHEN 5-325 MG PO TABS
1.0000 | ORAL_TABLET | Freq: Four times a day (QID) | ORAL | Status: DC | PRN
  Administered 2020-08-21: 1 via ORAL
  Administered 2020-08-21: 2 via ORAL
  Filled 2020-08-21: qty 2
  Filled 2020-08-21: qty 1

## 2020-08-21 MED ORDER — ONDANSETRON HCL 4 MG/2ML IJ SOLN
INTRAMUSCULAR | Status: AC
  Filled 2020-08-21: qty 2

## 2020-08-21 MED ORDER — FENTANYL CITRATE (PF) 100 MCG/2ML IJ SOLN
INTRAMUSCULAR | Status: AC
  Filled 2020-08-21: qty 2

## 2020-08-21 MED ORDER — SODIUM BICARBONATE 8.4 % IV SOLN
INTRAVENOUS | Status: DC | PRN
  Administered 2020-08-21 (×3): 5 mL via EPIDURAL
  Administered 2020-08-21: 3 mL via EPIDURAL

## 2020-08-21 SURGICAL SUPPLY — 22 items
CLIP FILSHIE TUBAL LIGA STRL (Clip) ×5 IMPLANT
CLOTH BEACON ORANGE TIMEOUT ST (SAFETY) ×2 IMPLANT
DRSG OPSITE POSTOP 3X4 (GAUZE/BANDAGES/DRESSINGS) ×2 IMPLANT
DURAPREP 26ML APPLICATOR (WOUND CARE) ×2 IMPLANT
GLOVE BIO SURGEON STRL SZ7.5 (GLOVE) ×2 IMPLANT
GLOVE BIOGEL PI IND STRL 7.0 (GLOVE) ×2 IMPLANT
GLOVE BIOGEL PI INDICATOR 7.0 (GLOVE) ×2
GOWN STRL REUS W/TWL LRG LVL3 (GOWN DISPOSABLE) ×2 IMPLANT
GOWN STRL REUS W/TWL XL LVL3 (GOWN DISPOSABLE) ×2 IMPLANT
NEEDLE HYPO 22GX1.5 SAFETY (NEEDLE) ×2 IMPLANT
NS IRRIG 1000ML POUR BTL (IV SOLUTION) ×2 IMPLANT
PACK ABDOMINAL MINOR (CUSTOM PROCEDURE TRAY) ×2 IMPLANT
PROTECTOR NERVE ULNAR (MISCELLANEOUS) ×2 IMPLANT
SPONGE LAP 4X18 RFD (DISPOSABLE) IMPLANT
SUT MNCRL AB 4-0 PS2 18 (SUTURE) ×2 IMPLANT
SUT PLAIN 0 NONE (SUTURE) ×2 IMPLANT
SUT VIC AB 0 CT1 27 (SUTURE) ×1
SUT VIC AB 0 CT1 27XBRD ANBCTR (SUTURE) ×1 IMPLANT
SYR CONTROL 10ML LL (SYRINGE) ×2 IMPLANT
TOWEL OR 17X24 6PK STRL BLUE (TOWEL DISPOSABLE) ×4 IMPLANT
TRAY FOLEY CATH SILVER 14FR (SET/KITS/TRAYS/PACK) ×2 IMPLANT
WATER STERILE IRR 1000ML POUR (IV SOLUTION) ×2 IMPLANT

## 2020-08-21 NOTE — Anesthesia Postprocedure Evaluation (Signed)
Anesthesia Post Note  Patient: Deborah Singleton  Procedure(s) Performed: POST PARTUM TUBAL LIGATION     Patient location during evaluation: Mother Baby Anesthesia Type: Epidural Level of consciousness: awake and alert Pain management: pain level controlled Vital Signs Assessment: post-procedure vital signs reviewed and stable Respiratory status: spontaneous breathing, nonlabored ventilation and respiratory function stable Cardiovascular status: stable Postop Assessment: no headache, no backache, epidural receding and patient able to bend at knees Anesthetic complications: no   No notable events documented.  Last Vitals:  Vitals:   08/21/20 0947 08/21/20 1112  BP: (!) 111/47 (!) 107/53  Pulse: 87 73  Resp: 17 17  Temp: 36.8 C 36.9 C  SpO2: 100% 99%    Last Pain:  Vitals:   08/21/20 0947  TempSrc: Axillary  PainSc:    Pain Goal: Patients Stated Pain Goal: 0 (08/19/20 2051)                 Mellody Dance

## 2020-08-21 NOTE — Progress Notes (Addendum)
POSTPARTUM PROGRESS NOTE  Post Partum Day 1  Subjective:  Cyrena Oriordan is a 25 y.o. [id number] s/p SVD at [redacted]w[redacted]d.  She reports she is doing well. No acute events overnight. She denies any problems with ambulating, voiding or po intake. Denies nausea or vomiting.  Pain is moderately controlled.  Lochia is mild.  Objective: Blood pressure (!) 112/56, pulse 76, temperature 98.7 F (37.1 C), temperature source Oral, resp. rate 16, height 5' (1.524 m), weight 112 kg, last menstrual period 11/06/2019, SpO2 99 %, unknown if currently breastfeeding.  Physical Exam:  General: alert, cooperative and no distress Chest: no respiratory distress Heart:regular rate, distal pulses intact Abdomen: soft, nontender,  Uterine Fundus: firm, appropriately tender DVT Evaluation: No calf swelling or tenderness Extremities: no edema Skin: warm, dry  Recent Labs    08/19/20 2109 08/20/20 1329  HGB 10.4* 10.1*  HCT 34.5* 34.6*    Assessment/Plan: Eduardo Hulick is a 25 y.o. [id number] s/p SVD after IOL gHTN at [redacted]w[redacted]d   PPD#1 - Doing well  Routine postpartum care gHTN - vitals stable and within goal. No s/sx PreE, labs normal on admission Contraception: BTL, NPO for postpartum tubal today Feeding: breast and bottle Dispo: Plan for discharge PPD2.   LOS: 2 days   Nelson Chimes MD Resident Physician  08/21/2020, 8:20 AM  Attestation of Supervision of Student:  I confirm that I have verified the information documented in the  resident  student's note and that I have also personally reperformed the history, physical exam and all medical decision making activities.  I have verified that all services and findings are accurately documented in this student's note; and I agree with management and plan as outlined in the documentation. I have also made any necessary editorial changes.  Rolm Bookbinder, CNM Center for [employer], [facility] 08/21/2020 10:52 AM

## 2020-08-21 NOTE — Op Note (Signed)
Deborah Singleton 08/21/2020  PREOPERATIVE DIAGNOSES: Multiparity, undesired fertility  POSTOPERATIVE DIAGNOSES: Multiparity, undesired fertility  PROCEDURE:  Postpartum Bilateral Tubal Sterilization using Filshie Clips   SURGEON: Dr.  Nettie Elm  ANESTHESIA:  Epidural and local analgesia using 20 ml of 0.5% Marcaine  COMPLICATIONS:  None immediate.  ESTIMATED BLOOD LOSS: 25 ml.  FLUIDS: As record  URINE OUTPUT:  As record  INDICATIONS:  25 y.o. [id number] with undesired fertility,status post vaginal delivery, desires permanent sterilization.  Other reversible forms of contraception were discussed with patient; she declines all other modalities. Risks of procedure discussed with patient including but not limited to: risk of regret, permanence of method, bleeding, infection, injury to surrounding organs and need for additional procedures.  Failure risk of 1 -2 % with increased risk of ectopic gestation if pregnancy occurs was also discussed with patient.      FINDINGS:  Normal uterus, tubes, and ovaries.  PROCEDURE DETAILS: The patient was taken to the operating room where her epidural anesthesia was dosed up to surgical level and found to be adequate.  She was then placed in the dorsal supine position and prepped and draped in sterile fashion.  After an adequate timeout was performed, attention was turned to the patient's abdomen where a small transverse skin incision was made under the umbilical fold. The incision was taken down to the layer of fascia using the scalpel, and fascia was incised, and extended bilaterally using Mayo scissors. The peritoneum was entered in a sharp fashion. Attention was then turned to the patient's uterus, and left fallopian tube was identified and followed out to the fimbriated end.  A Filshie clip was placed on the left fallopian tube about 3 cm from the cornual attachment, with care given to incorporate the underlying mesosalpinx.  A similar process was carried  out on the right side allowing for bilateral tubal sterilization.  Good hemostasis was noted overall.  Local analgesia was injected into both Filshie application sites.The instruments were then removed from the patient's abdomen and the fascial incision was repaired with 0 Vicryl, and the skin was closed with a 4-0 Vicryl subcuticular stitch. The patient tolerated the procedure well.  Instrument, sponge, and needle counts were correct times two.  The patient was then taken to the recovery room awake and in stable condition.  Nettie Elm, MD, FACOG Attending Obstetrician & Gynecologist Faculty Practice, [facility]

## 2020-08-21 NOTE — Anesthesia Postprocedure Evaluation (Signed)
Anesthesia Post Note  Patient: Deborah Singleton  Procedure(s) Performed: POST PARTUM TUBAL LIGATION (Bilateral)     Patient location during evaluation: PACU Anesthesia Type: Epidural Level of consciousness: awake Pain management: pain level controlled Vital Signs Assessment: post-procedure vital signs reviewed and stable Respiratory status: spontaneous breathing and respiratory function stable Cardiovascular status: stable Postop Assessment: no apparent nausea or vomiting, epidural receding, no headache and no backache Anesthetic complications: no   No notable events documented.  Last Vitals:  Vitals:   08/21/20 1405 08/21/20 1453  BP: 111/62 126/76  Pulse: 78 80  Resp: 15 17  Temp: 36.5 C 36.5 C  SpO2: 99% 99%    Last Pain:  Vitals:   08/21/20 1505  TempSrc:   PainSc: 6    Pain Goal: Patients Stated Pain Goal: 0 (08/19/20 2051)              Epidural/Spinal Function Cutaneous sensation: Tingles (08/21/20 1505), Patient able to flex knees: Yes (08/21/20 1505), Patient able to lift hips off bed: Yes (08/21/20 1505), Back pain beyond tenderness at insertion site: No (08/21/20 1505), Progressively worsening motor and/or sensory loss: No (08/21/20 1505), Bowel and/or bladder incontinence post epidural: No (08/21/20 1505)  Mellody Dance

## 2020-08-21 NOTE — Transfer of Care (Signed)
Immediate Anesthesia Transfer of Care Note  Patient: Deborah Singleton  Procedure(s) Performed: POST PARTUM TUBAL LIGATION (Bilateral)  Patient Location: PACU  Anesthesia Type:Epidural  Level of Consciousness: awake  Airway & Oxygen Therapy: Patient Spontanous Breathing  Post-op Assessment: Report given to RN and Post -op Vital signs reviewed and stable  Post vital signs: Reviewed and stable  Last Vitals:  Vitals Value Taken Time  BP 96/56 08/21/20 1245  Temp    Pulse 88 08/21/20 1248  Resp 17 08/21/20 1248  SpO2 95 % 08/21/20 1248  Vitals shown include unvalidated device data.  Last Pain:  Vitals:   08/21/20 1133  TempSrc:   PainSc: 0-No pain      Patients Stated Pain Goal: 0 (08/19/20 2051)  Complications: No notable events documented.

## 2020-08-21 NOTE — Progress Notes (Signed)
Patient desires bilateral tubal sterilization.  Other reversible forms of contraception were discussed with patient; she declines all other modalities. Discussed bilateral tubal sterilization in detail; discussed options of  bilateral tubal sterilization using Filshie clips vs  bilateral salpingectomy. Risks and benefits discussed in detail including but not limited to: risk of regret, permanence of method, bleeding, infection, injury to surrounding organs and need for additional procedures.  Failure risk of 1-2 % for Filshie clips and <1% for bilateral salpingectomy with increased risk of ectopic gestation if pregnancy occurs was also discussed with patient.  Also discussed possible reduction of risk of ovarian cancer via bilateral salpingectomy given that a growing body of knowledge reveals that the majority of cases of high grade serous "ovarian" cancer actually are actually  cancers arising from the fimbriated end of the fallopian tubes. Emphasized that removal of fallopian tubes do not result in any known hormonal imbalance.  Patient verbalized understanding of these risks and benefits and wants to proceed with sterilization with bilateral sterilization using Filshie clips.     Medicaid papers have been signed. To OR when ready.

## 2020-08-21 NOTE — Anesthesia Preprocedure Evaluation (Signed)
Anesthesia Evaluation  Patient identified by MRN, date of birth, ID band Patient awake    Reviewed: Allergy & Precautions, Patient's Chart, lab work & pertinent test results  Airway Mallampati: II  TM Distance: >3 FB Neck ROM: Full    Dental no notable dental hx.    Pulmonary neg pulmonary ROS,    Pulmonary exam normal breath sounds clear to auscultation       Cardiovascular hypertension (gHTN), Normal cardiovascular exam Rhythm:Regular Rate:Normal     Neuro/Psych Seizures -,  negative psych ROS   GI/Hepatic negative GI ROS, Neg liver ROS, BMI 48   Endo/Other  Morbid obesity  Renal/GU negative Renal ROS  negative genitourinary   Musculoskeletal negative musculoskeletal ROS (+)   Abdominal (+) + obese,   Peds negative pediatric ROS (+)  Hematology  (+) Blood dyscrasia, anemia ,   Anesthesia Other Findings   Reproductive/Obstetrics (+) Pregnancy                             Anesthesia Physical  Anesthesia Plan  ASA: 3  Anesthesia Plan: Epidural   Post-op Pain Management:    Induction:   PONV Risk Score and Plan: 2  Airway Management Planned: Natural Airway  Additional Equipment: None  Intra-op Plan:   Post-operative Plan:   Informed Consent: I have reviewed the patients History and Physical, chart, labs and discussed the procedure including the risks, benefits and alternatives for the proposed anesthesia with the patient or authorized representative who has indicated his/her understanding and acceptance.       Plan Discussed with: CRNA and Anesthesiologist  Anesthesia Plan Comments: (Patient states epidural worked really well for labor/delivery. Will plan to dose epidural with lido for surgical anesthesia for tubal. Backup plan spinal vs. GETA. Ques answered, consent signed. Patient agrees with plan and wishes to proceed. Tanna Furry, MD  )        Anesthesia Quick  Evaluation

## 2020-08-21 NOTE — Progress Notes (Signed)
Attending Circumcision Counseling Progress Note  Patient desires circumcision for her female infant.  Circumcision procedure details discussed, risks and benefits of procedure were also discussed.  These include but are not limited to: Benefits of circumcision in men include reduction in the rates of urinary tract infection (UTI), penile cancer, some sexually transmitted infections, penile inflammatory and retractile disorders, as well as easier hygiene.  Risks include bleeding , infection, injury of glans which may lead to penile deformity or urinary tract issues, unsatisfactory cosmetic appearance and other potential complications related to the procedure.  It was emphasized that this is an elective procedure.  Patient wants to proceed with circumcision; written informed consent obtained.  Will do circumcision soon, routine circumcision and post circumcision care ordered for the infant.  Kendyn Zaman L. Alysia Penna, M.D. 08/21/2020 11:11 AM

## 2020-08-22 MED ORDER — OXYCODONE HCL 5 MG PO TABS: 5.0000 mg | ORAL_TABLET | Freq: Four times a day (QID) | ORAL | 0 refills | Status: DC | PRN

## 2020-08-22 MED ORDER — OXYCODONE HCL 5 MG PO TABS
5.0000 mg | ORAL_TABLET | Freq: Four times a day (QID) | ORAL | Status: DC | PRN
  Administered 2020-08-22: 5 mg via ORAL
  Filled 2020-08-22: qty 1

## 2020-08-22 MED ORDER — IBUPROFEN 600 MG PO TABS: 600.0000 mg | ORAL_TABLET | Freq: Four times a day (QID) | ORAL | 0 refills | Status: DC

## 2020-08-22 MED ORDER — SENNOSIDES-DOCUSATE SODIUM 8.6-50 MG PO TABS: 2.0000 | ORAL_TABLET | Freq: Every day | ORAL | 0 refills | Status: DC

## 2020-08-23 ENCOUNTER — Telehealth: Payer: Self-pay

## 2020-08-23 ENCOUNTER — Other Ambulatory Visit: Payer: Self-pay

## 2020-08-23 NOTE — Telephone Encounter (Signed)
Transition Care Management Follow-up Telephone Call Date of discharge and from where: 08/22/2020-[facility] How have you been since you were released from the hospital? Patient stated she is still in a lot of pain.  Any questions or concerns? No  Items Reviewed: Did the pt receive and understand the discharge instructions provided? Yes  Medications obtained and verified? Yes  Other? No  Any new allergies since your discharge? No  Dietary orders reviewed? No Do you have support at home? Yes   Home Care and Equipment/Supplies: Were home health services ordered? not applicable If so, what is the name of the agency? N/A  Has the agency set up a time to come to the patient's home? not applicable Were any new equipment or medical supplies ordered?  No What is the name of the medical supply agency? N/A Were you able to get the supplies/equipment? not applicable Do you have any questions related to the use of the equipment or supplies? No  Functional Questionnaire: (I = Independent and D = Dependent) ADLs: I  Bathing/Dressing- I  Meal Prep- I  Eating- I  Maintaining continence- I  Transferring/Ambulation- I  Managing Meds- I  Follow up appointments reviewed:  PCP Hospital f/u appt confirmed? No   Specialist Hospital f/u appt confirmed? Yes  Scheduled to see Dr. Crissie Reese on 08/24/2020 @ 9:15 am. Are transportation arrangements needed? No  If their condition worsens, is the pt aware to call PCP or go to the Emergency Dept.? Yes Was the patient provided with contact information for the PCP's office or ED? Yes Was to pt encouraged to call back with questions or concerns? Yes

## 2020-08-24 ENCOUNTER — Other Ambulatory Visit: Payer: Medicaid Other

## 2020-08-24 ENCOUNTER — Encounter: Payer: Medicaid Other | Admitting: Family Medicine

## 2020-09-01 ENCOUNTER — Telehealth (HOSPITAL_COMMUNITY): Payer: Self-pay | Admitting: *Deleted

## 2020-09-01 NOTE — Telephone Encounter (Signed)
Mom reports doing well. No concerns about herself. EPDS = 0 [facility] score=0) Mom reports baby is doing well. Feeding well with formula. Peeing and pooping without difficulty. Sleeps in crib on back. No concerns with baby.  Duffy Rhody, RN 09-01-2020 at 3:25pm

## 2020-09-02 ENCOUNTER — Other Ambulatory Visit: Payer: Self-pay

## 2020-09-02 ENCOUNTER — Ambulatory Visit (INDEPENDENT_AMBULATORY_CARE_PROVIDER_SITE_OTHER): Payer: Medicaid Other

## 2020-09-02 VITALS — BP 113/51 | HR 92

## 2020-09-02 DIAGNOSIS — Z0130 Encounter for examination of blood pressure without abnormal findings: Secondary | ICD-10-CM

## 2020-09-02 NOTE — Progress Notes (Signed)
Pt here today for incision check s/p BTL on 08/20/20.  Pt denies pain or vaginal bleeding. Pt also here for BP check d/t gHTN.  Pt is currently not prescribed BP medication.   Pt denies headache or visutal disturbances.  BP LA 113/51.  Observed incision at the umbilicus- incision healing well, no drainage, no swelling, no edema, and no erythema.  Pt advised to continue to monitor for sx's of elevated BP and infection.  We will evaluate her at her pp visit scheduled on 09/30/20.  Pt verbalized understanding with no further questions.   Leonette Nutting  09/02/20

## 2020-09-02 NOTE — Progress Notes (Signed)
Patient was assessed and managed by nursing staff during this encounter. I have reviewed the chart and agree with the documentation and plan. I have also made any necessary editorial changes.  Doloros Kwolek A Heath Badon, MD 09/02/2020 4:39 PM   

## 2020-09-13 ENCOUNTER — Encounter: Payer: Self-pay | Admitting: *Deleted

## 2020-09-17 ENCOUNTER — Ambulatory Visit
Admission: EM | Admit: 2020-09-17 | Discharge: 2020-09-17 | Disposition: A | Discharge status: Home / Self Care | Payer: Medicaid Other | Attending: Urgent Care | Admitting: Urgent Care

## 2020-09-17 ENCOUNTER — Other Ambulatory Visit: Payer: Self-pay

## 2020-09-17 DIAGNOSIS — N898 Other specified noninflammatory disorders of vagina: Secondary | ICD-10-CM | POA: Diagnosis not present

## 2020-09-17 LAB — POCT URINALYSIS DIP (MANUAL ENTRY)
Bilirubin, UA: NEGATIVE
Blood, UA: NEGATIVE
Glucose, UA: NEGATIVE mg/dL
Nitrite, UA: NEGATIVE
Spec Grav, UA: 1.025 (ref 1.010–1.025)
Urobilinogen, UA: 0.2 E.U./dL
pH, UA: 6 (ref 5.0–8.0)

## 2020-09-17 NOTE — ED Provider Notes (Signed)
[facility]   MRN: [id number] DOB: 1995/10/01  Subjective:   Deborah Singleton is a 25 y.o. female presenting for 1 month history of persistent clear watery vaginal discharge. Had vaginal delivery without complications 08/20/2020. Would like to be checked for STIs, yeast and bacterial vaginosis. No recent antibiotic use. Denies fever, n/v, abdominal pain, pelvic pain, rashes, dysuria, urinary frequency, hematuria, vaginal pain or discomfort.  Would also like to be checked for an UTI.    No current facility-administered medications for this encounter.  Current Outpatient Medications:    ibuprofen (ADVIL) 600 MG tablet, Take 1 tablet (600 mg total) by mouth every 6 (six) hours., Disp: 30 tablet, Rfl: 0   levETIRAcetam (KEPPRA) 500 MG tablet, Take 1/2 tablet twice a day for 3 days, then increase to 1 tablet twice a day, Disp: 60 tablet, Rfl: 11   oxyCODONE (OXY IR/ROXICODONE) 5 MG immediate release tablet, Take 1 tablet (5 mg total) by mouth every 6 (six) hours as needed for moderate pain, severe pain or breakthrough pain. (Patient not taking: Reported on 09/02/2020), Disp: 8 tablet, Rfl: 0   Prenatal Vit-Fe Fumarate-FA (PRENATAL MULTIVITAMIN) TABS tablet, Take 1 tablet by mouth daily at 12 noon., Disp: , Rfl:    senna-docusate (SENOKOT-S) 8.6-50 MG tablet, Take 2 tablets by mouth daily. (Patient not taking: Reported on 09/02/2020), Disp: 30 tablet, Rfl: 0   Allergies  Allergen Reactions   Acetaminophen Itching    Patient states "whatever I was given when I went home after my last baby had tylenol and I had black spots over my legs."    Past Medical History:  Diagnosis Date   Miscarriage 06/02/2019   Seizures (HCC)    last seizure Aug 2021     Past Surgical History:  Procedure Laterality Date   TOOTH EXTRACTION     TUBAL LIGATION Bilateral 08/21/2020   Procedure: POST PARTUM TUBAL LIGATION;  Surgeon: Hermina Staggers, MD;  Location: [facility] LD ORS;  Service: Gynecology;  Laterality:  Bilateral;    Family History  Problem Relation Age of Onset   Asthma Mother    Seizures Mother    Diabetes Father    Depression Sister    Diabetes Sister        younger sister   Cancer Maternal Aunt        on dad's side   Cancer Maternal Uncle        on dad's side    Social History   Tobacco Use   Smoking status: Never   Smokeless tobacco: Never  Vaping Use   Vaping Use: Never used  Substance Use Topics   Alcohol use: Not Currently   Drug use: No    ROS   Objective:   Vitals: BP 97/63 (BP Location: Left Arm)   Pulse 93   Temp 98.8 F (37.1 C) (Oral)   Resp 18   LMP  (Exact Date) Comment: has not started having cycles after recent childbirth  SpO2 98%   Breastfeeding No   Physical Exam Constitutional:      General: She is not in acute distress.    Appearance: Normal appearance. She is well-developed. She is not ill-appearing, toxic-appearing or diaphoretic.  HENT:     Head: Normocephalic and atraumatic.     Nose: Nose normal.     Mouth/Throat:     Mouth: Mucous membranes are moist.     Pharynx: Oropharynx is clear.  Eyes:     General: No scleral icterus.  Right eye: No discharge.        Left eye: No discharge.     Extraocular Movements: Extraocular movements intact.     Conjunctiva/sclera: Conjunctivae normal.     Pupils: Pupils are equal, round, and reactive to light.  Cardiovascular:     Rate and Rhythm: Normal rate.  Pulmonary:     Effort: Pulmonary effort is normal.  Abdominal:     Tenderness: There is no right CVA tenderness or left CVA tenderness.  Skin:    General: Skin is warm and dry.  Neurological:     General: No focal deficit present.     Mental Status: She is alert and oriented to person, place, and time.  Psychiatric:        Mood and Affect: Mood normal.        Behavior: Behavior normal.        Thought Content: Thought content normal.        Judgment: Judgment normal.    Results for orders placed or performed during the  hospital encounter of 09/17/20 (from the past 24 hour(s))  POCT urinalysis dipstick     Status: Abnormal   Collection Time: 09/17/20  6:57 PM  Result Value Ref Range   Color, UA yellow yellow   Clarity, UA cloudy (A) clear   Glucose, UA negative negative mg/dL   Bilirubin, UA negative negative   Ketones, POC UA small (15) (A) negative mg/dL   Spec Grav, UA 9.735 3.299 - 1.025   Blood, UA negative negative   pH, UA 6.0 5.0 - 8.0   Protein Ur, POC trace (A) negative mg/dL   Urobilinogen, UA 0.2 0.2 or 1.0 E.U./dL   Nitrite, UA Negative Negative   Leukocytes, UA Small (1+) (A) Negative     Assessment and Plan :   PDMP not reviewed this encounter.  1. Vaginal discharge     Will base treatment off of results. Encouraged patient to hydrate better. Low suspicion for an UTI and therefore did not pursue urine culture.    Wallis Bamberg, [place] 09/17/20 1921

## 2020-09-17 NOTE — ED Triage Notes (Signed)
Pt c/o Watery, clear, non-odorous, vaginal discharge that started approx 7/22 after childbirth. States she wants to "get checked just in case." States this has happened before after receiving abx.

## 2020-09-20 ENCOUNTER — Telehealth (HOSPITAL_COMMUNITY): Payer: Self-pay | Admitting: Emergency Medicine

## 2020-09-20 LAB — CERVICOVAGINAL ANCILLARY ONLY
Bacterial Vaginitis (gardnerella): POSITIVE — AB
Candida Glabrata: NEGATIVE
Candida Vaginitis: NEGATIVE
Chlamydia: NEGATIVE
Comment: NEGATIVE
Comment: NEGATIVE
Comment: NEGATIVE
Comment: NEGATIVE
Comment: NEGATIVE
Comment: NORMAL
Neisseria Gonorrhea: NEGATIVE
Trichomonas: NEGATIVE

## 2020-09-20 MED ORDER — METRONIDAZOLE 0.75 % VA GEL: 1.0000 | Freq: Every day | VAGINAL | 0 refills | Status: AC

## 2020-09-30 ENCOUNTER — Other Ambulatory Visit: Payer: Self-pay

## 2020-09-30 ENCOUNTER — Encounter: Payer: Self-pay | Admitting: Obstetrics and Gynecology

## 2020-09-30 ENCOUNTER — Ambulatory Visit (INDEPENDENT_AMBULATORY_CARE_PROVIDER_SITE_OTHER): Payer: Medicaid Other | Admitting: Obstetrics and Gynecology

## 2020-09-30 NOTE — Progress Notes (Signed)
Post Partum Visit Note  Deborah Singleton is a 25 y.o. [phone] female who presents for a postpartum visit. She is 6 weeks postpartum following a normal spontaneous vaginal delivery.  I have fully reviewed the prenatal and intrapartum course. The delivery was at 38.1 gestational weeks.  Anesthesia: epidural. Postpartum course has been uncomplicated. Baby is doing well. Baby is feeding by bottle Daron Offer . Bleeding moderate lochia. Bowel function is normal. Bladder function is normal. Patient is not sexually active. Contraception method is tubal ligation. Postpartum depression screening: negative.   The pregnancy intention screening data noted above was reviewed. Potential methods of contraception were discussed. The patient elected to proceed with No data recorded.   Edinburgh Postnatal Depression Scale - 09/30/20 1330       Edinburgh Postnatal Depression Scale:  In the Past 7 Days   I have been able to laugh and see the funny side of things. 0    I have looked forward with enjoyment to things. 0    I have blamed myself unnecessarily when things went wrong. 0    I have been anxious or worried for no good reason. 0    I have felt scared or panicky for no good reason. 0    Things have been getting on top of me. 0    I have been so unhappy that I have had difficulty sleeping. 0    I have felt sad or miserable. 2    I have been so unhappy that I have been crying. 0    The thought of harming myself has occurred to me. 0    Edinburgh Postnatal Depression Scale Total 2             Health Maintenance Due  Topic Date Due   COVID-19 Vaccine (1) Never done   Pneumococcal Vaccine 19-81 Years old (1 - PCV) Never done   HPV VACCINES (1 - 2-dose series) Never done   INFLUENZA VACCINE  08/30/2020       Review of Systems Pertinent items noted in HPI and remainder of comprehensive ROS otherwise negative.  Objective:  BP 119/73   Pulse 96   Wt 234 lb (106.1 kg)   LMP 11/06/2019  Comment: Patient thinks period statrted back 08/3020  Breastfeeding No   BMI 45.70 kg/m    General:  alert, cooperative, and no distress   Breasts:  normal  Lungs: clear to auscultation bilaterally  Heart:  regular rate and rhythm  Abdomen: soft, non-tender; bowel sounds normal; no masses,  no organomegaly   Wound well approximated incision  GU exam:  not indicated       Assessment:    There are no diagnoses linked to this encounter.  Normal postpartum exam.   Plan:   Essential components of care per ACOG recommendations:  1.  Mood and well being: Patient with negative depression screening today. Reviewed local resources for support.  - Patient tobacco use? No.   - hx of drug use? No.    2. Infant care and feeding:  -Patient currently breastmilk feeding? No.  -Social determinants of health (SDOH) reviewed in EPIC. No concerns  3. Sexuality, contraception and birth spacing - Patient does not want a pregnancy in the next year.  Desired family size is 3 children.  - Patient had a bilateral tubal ligation    4. Sleep and fatigue -Encouraged family/partner/community support of 4 hrs of uninterrupted sleep to help with mood and fatigue  5. Physical  Recovery  - Discussed patients delivery and complications. She describes her labor as good. - Patient had a Vaginal, no problems at delivery. Perineal healing reviewed. Patient expressed understanding - Patient has urinary incontinence? No. - Patient is safe to resume physical and sexual activity  6.  Health Maintenance - HM due items addressed Yes - Last pap smear  Diagnosis  Date Value Ref Range Status  02/26/2020   Final   - Negative for intraepithelial lesion or malignancy (NILM)   Pap smear not done at today's visit.  -Breast Cancer screening indicated? No.   7. Chronic Disease/Pregnancy Condition follow up: None  - PCP follow up  Catalina Antigua, MD Center for [facility], [facility]

## 2020-10-19 ENCOUNTER — Telehealth: Payer: Self-pay | Admitting: Family Medicine

## 2020-10-19 ENCOUNTER — Encounter: Payer: Self-pay | Admitting: Lactation Services

## 2020-10-19 NOTE — Telephone Encounter (Signed)
Letter for return to work created and in My Chart. Was not able to reach patient, LM for patient to let her know the letter is in her My Chart.

## 2020-10-19 NOTE — Telephone Encounter (Signed)
Patient requesting a note to be cleared  to return to work.

## 2020-11-02 ENCOUNTER — Other Ambulatory Visit: Payer: Self-pay

## 2020-11-02 ENCOUNTER — Ambulatory Visit
Admission: EM | Admit: 2020-11-02 | Discharge: 2020-11-02 | Disposition: A | Discharge status: Home / Self Care | Payer: Medicaid Other | Attending: Internal Medicine | Admitting: Internal Medicine

## 2020-11-02 DIAGNOSIS — N898 Other specified noninflammatory disorders of vagina: Secondary | ICD-10-CM | POA: Insufficient documentation

## 2020-11-02 DIAGNOSIS — Z113 Encounter for screening for infections with a predominantly sexual mode of transmission: Secondary | ICD-10-CM | POA: Diagnosis not present

## 2020-11-02 NOTE — ED Triage Notes (Signed)
2 day h/o vaginal discharge. Denies hematuria, dysuria, urinary frequency and urgency.

## 2020-11-02 NOTE — Discharge Instructions (Addendum)
Vaginal swab is pending.  We will call with those results.  Please refrain from sexual activity until test results are complete.

## 2020-11-02 NOTE — ED Provider Notes (Signed)
[facility]    CSN: [id number] Arrival date & time: 11/02/20  1236      History   Chief Complaint Chief Complaint  Patient presents with   Vaginal Discharge    HPI Deborah Singleton is a 25 y.o. female.   Patient presents with 2-day history of clear vaginal discharge.  Denies any hematuria, urinary burning, urinary frequency, urinary urgency, irregular vaginal bleeding, fever, pelvic pain, abdominal pain, back pain.  Denies any known exposure to STD but has had unprotected sexual intercourse recently.  Last menstrual cycle is unknown as patient has had tubal ligation recently.   Vaginal Discharge  Past Medical History:  Diagnosis Date   Miscarriage 06/02/2019   Seizures [facility])    last seizure Aug 2021    Patient Active Problem List   Diagnosis Date Noted   Gestational hypertension, third trimester 08/19/2020   Supervision of other normal pregnancy, antepartum 02/26/2020   Seizure disorder in pregnancy, antepartum (HCC) 02/26/2020   Obesity during pregnancy, antepartum 02/26/2020   Generalized idiopathic epilepsy and epileptic syndromes, not intractable, without status epilepticus (HCC) 04/24/2018   Maternal varicella, non-immune 04/04/2016    Past Surgical History:  Procedure Laterality Date   TOOTH EXTRACTION     TUBAL LIGATION Bilateral 08/21/2020   Procedure: POST PARTUM TUBAL LIGATION;  Surgeon: Hermina Staggers, MD;  Location: [facility] LD ORS;  Service: Gynecology;  Laterality: Bilateral;    OB History     Gravida  4   Para  3   Term  2   Preterm  1   AB  1   Living  3      SAB      IAB  1   Ectopic      Multiple  0   Live Births  3            Home Medications    Prior to Admission medications   Medication Sig Start Date End Date Taking? Authorizing Provider  ibuprofen (ADVIL) 600 MG tablet Take 1 tablet (600 mg total) by mouth every 6 (six) hours. Patient not taking: Reported on 09/30/2020 08/22/20   Nugent, Odie Sera, NP   levETIRAcetam (KEPPRA) 500 MG tablet Take 1/2 tablet twice a day for 3 days, then increase to 1 tablet twice a day 01/14/20   Van Clines, MD  oxyCODONE (OXY IR/ROXICODONE) 5 MG immediate release tablet Take 1 tablet (5 mg total) by mouth every 6 (six) hours as needed for moderate pain, severe pain or breakthrough pain. Patient not taking: No sig reported 08/22/20   Nugent, Odie Sera, NP  Prenatal Vit-Fe Fumarate-FA (PRENATAL MULTIVITAMIN) TABS tablet Take 1 tablet by mouth daily at 12 noon. Patient not taking: Reported on 09/30/2020    [provider]  senna-docusate (SENOKOT-S) 8.6-50 MG tablet Take 2 tablets by mouth daily. Patient not taking: No sig reported 08/23/20   Nugent, Odie Sera, NP    Family History Family History  Problem Relation Age of Onset   Asthma Mother    Seizures Mother    Diabetes Father    Depression Sister    Diabetes Sister        younger sister   Cancer Maternal Aunt        on dad's side   Cancer Maternal Uncle        on dad's side    Social History Social History   Tobacco Use   Smoking status: Never   Smokeless tobacco: Never  Vaping Use  Vaping Use: Never used  Substance Use Topics   Alcohol use: Not Currently   Drug use: No     Allergies   Acetaminophen   Review of Systems Review of Systems Per HPI  Physical Exam Triage Vital Signs ED Triage Vitals  Enc Vitals Group     BP 11/02/20 1346 116/83     Pulse Rate 11/02/20 1346 83     Resp 11/02/20 1346 18     Temp 11/02/20 1346 98 F (36.7 C)     Temp Source 11/02/20 1346 Oral     SpO2 11/02/20 1346 98 %     Weight --      Height --      Head Circumference --      Peak Flow --      Pain Score 11/02/20 1348 0     Pain Loc --      Pain Edu? --      Excl. in [place]? --    No data found.  Updated Vital Signs BP 116/83 (BP Location: Left Arm)   Pulse 83   Temp 98 F (36.7 C) (Oral)   Resp 18   LMP 11/06/2019 Comment: tubal ligation  SpO2 98%   Breastfeeding No    Visual Acuity Right Eye Distance:   Left Eye Distance:   Bilateral Distance:    Right Eye Near:   Left Eye Near:    Bilateral Near:     Physical Exam Constitutional:      Appearance: Normal appearance.  HENT:     Head: Normocephalic and atraumatic.  Eyes:     Extraocular Movements: Extraocular movements intact.     Conjunctiva/sclera: Conjunctivae normal.  Pulmonary:     Effort: Pulmonary effort is normal.  Genitourinary:    Comments: Deferred with shared decision making.  Self swab performed. Neurological:     General: No focal deficit present.     Mental Status: She is alert and oriented to person, place, and time. Mental status is at baseline.  Psychiatric:        Mood and Affect: Mood normal.        Behavior: Behavior normal.        Thought Content: Thought content normal.        Judgment: Judgment normal.     UC Treatments / Results  Labs (all labs ordered are listed, but only abnormal results are displayed) Labs Reviewed  CERVICOVAGINAL ANCILLARY ONLY    EKG   Radiology No results found.  Procedures Procedures (including critical care time)  Medications Ordered in UC Medications - No data to display  Initial Impression / Assessment and Plan / UC Course  I have reviewed the triage vital signs and the nursing notes.  Pertinent labs & imaging results that were available during my care of the patient were reviewed by me and considered in my medical decision making (see chart for details).     Cervicovaginal swab pending.  Do not think urinalysis is necessary given patient does not have any urinary symptoms.  Will await test results before treatment is started.  No red flags on exam. Discussed strict return precautions. Patient verbalized understanding and is agreeable with plan.  Final Clinical Impressions(s) / UC Diagnoses   Final diagnoses:  Vaginal discharge  Screening examination for venereal disease     Discharge Instructions       Vaginal swab is pending.  We will call with those results.  Please refrain from sexual activity until test results  are complete.     ED Prescriptions   None    PDMP not reviewed this encounter.   Lance Muss, FNP 11/02/20 1422

## 2020-11-03 LAB — CERVICOVAGINAL ANCILLARY ONLY
Bacterial Vaginitis (gardnerella): POSITIVE — AB
Candida Glabrata: NEGATIVE
Candida Vaginitis: NEGATIVE
Chlamydia: NEGATIVE
Comment: NEGATIVE
Comment: NEGATIVE
Comment: NEGATIVE
Comment: NEGATIVE
Comment: NEGATIVE
Comment: NORMAL
Neisseria Gonorrhea: NEGATIVE
Trichomonas: NEGATIVE

## 2020-11-04 ENCOUNTER — Telehealth (HOSPITAL_COMMUNITY): Payer: Self-pay | Admitting: Emergency Medicine

## 2020-11-04 MED ORDER — METRONIDAZOLE 500 MG PO TABS: 500.0000 mg | ORAL_TABLET | Freq: Two times a day (BID) | ORAL | 0 refills | Status: DC

## 2021-02-07 ENCOUNTER — Ambulatory Visit: Payer: Medicaid Other | Admitting: Neurology

## 2021-02-09 ENCOUNTER — Ambulatory Visit
Admission: EM | Admit: 2021-02-09 | Discharge: 2021-02-09 | Disposition: A | Discharge status: Home / Self Care | Payer: Medicaid Other | Attending: Physician Assistant | Admitting: Physician Assistant

## 2021-02-09 ENCOUNTER — Other Ambulatory Visit: Payer: Self-pay

## 2021-02-09 DIAGNOSIS — N3000 Acute cystitis without hematuria: Secondary | ICD-10-CM | POA: Insufficient documentation

## 2021-02-09 LAB — POCT URINALYSIS DIP (MANUAL ENTRY)
Bilirubin, UA: NEGATIVE
Glucose, UA: NEGATIVE mg/dL
Ketones, POC UA: NEGATIVE mg/dL
Nitrite, UA: NEGATIVE
Protein Ur, POC: NEGATIVE mg/dL
Spec Grav, UA: 1.025 (ref 1.010–1.025)
Urobilinogen, UA: 0.2 E.U./dL
pH, UA: 6.5 (ref 5.0–8.0)

## 2021-02-09 LAB — POCT URINE PREGNANCY: Preg Test, Ur: NEGATIVE

## 2021-02-09 MED ORDER — NITROFURANTOIN MONOHYD MACRO 100 MG PO CAPS: 100.0000 mg | ORAL_CAPSULE | Freq: Two times a day (BID) | ORAL | 0 refills | Status: DC

## 2021-02-09 NOTE — ED Provider Notes (Signed)
[facility]    CSN: [id number] Arrival date & time: 02/09/21  0805      History   Chief Complaint Chief Complaint  Patient presents with   Abdominal Pain    HPI Deborah Singleton is a 26 y.o. female.   Patient here today for evaluation of lower abdominal pain and nausea that started about a week ago. She states today she noticed light spotting which brought her here for evaluation. She had tubal ligation in July of last year and is concerned for complications. She has been having unprotected sex since that time. She denies any vaginal discharge or genital lesions. She reports she has been having regular menstrual periods with her last towards the end of last month. She did vomit last night. She has not had fever. She denies any diarrhea. She does not report treatment for symptoms.   The history is provided by the patient.  Abdominal Pain Associated symptoms: nausea and vomiting   Associated symptoms: no chills, no diarrhea, no fever, no shortness of breath and no vaginal discharge    Past Medical History:  Diagnosis Date   Miscarriage 06/02/2019   Seizures ([place])    last seizure Aug 2021    Patient Active Problem List   Diagnosis Date Noted   Gestational hypertension, third trimester 08/19/2020   Supervision of other normal pregnancy, antepartum 02/26/2020   Seizure disorder in pregnancy, antepartum ([place]) 02/26/2020   Obesity during pregnancy, antepartum 02/26/2020   Generalized idiopathic epilepsy and epileptic syndromes, not intractable, without status epilepticus ([place]) 04/24/2018   Maternal varicella, non-immune 04/04/2016    Past Surgical History:  Procedure Laterality Date   TOOTH EXTRACTION     TUBAL LIGATION Bilateral 08/21/2020   Procedure: POST PARTUM TUBAL LIGATION;  Surgeon: Chancy Milroy, MD;  Location: [facility] LD ORS;  Service: Gynecology;  Laterality: Bilateral;    OB History     Gravida  4   Para  3   Term  2   Preterm  1   AB  1    Living  3      SAB      IAB  1   Ectopic      Multiple  0   Live Births  3            Home Medications    Prior to Admission medications   Medication Sig Start Date End Date Taking? Authorizing Provider  nitrofurantoin, macrocrystal-monohydrate, (MACROBID) 100 MG capsule Take 1 capsule (100 mg total) by mouth 2 (two) times daily. 02/09/21  Yes Francene Finders, PA-C  ibuprofen (ADVIL) 600 MG tablet Take 1 tablet (600 mg total) by mouth every 6 (six) hours. Patient not taking: Reported on 09/30/2020 08/22/20   Nugent, Gerrie Nordmann, NP  levETIRAcetam (KEPPRA) 500 MG tablet Take 1/2 tablet twice a day for 3 days, then increase to 1 tablet twice a day 01/14/20   Cameron Sprang, MD  metroNIDAZOLE (FLAGYL) 500 MG tablet Take 1 tablet (500 mg total) by mouth 2 (two) times daily. 11/04/20   Lamptey, Myrene Galas, MD  oxyCODONE (OXY IR/ROXICODONE) 5 MG immediate release tablet Take 1 tablet (5 mg total) by mouth every 6 (six) hours as needed for moderate pain, severe pain or breakthrough pain. Patient not taking: No sig reported 08/22/20   Nugent, Gerrie Nordmann, NP  Prenatal Vit-Fe Fumarate-FA (PRENATAL MULTIVITAMIN) TABS tablet Take 1 tablet by mouth daily at 12 noon. Patient not taking: Reported on 09/30/2020  [provider]  senna-docusate (SENOKOT-S) 8.6-50 MG tablet Take 2 tablets by mouth daily. Patient not taking: No sig reported 08/23/20   Nugent, Gerrie Nordmann, NP    Family History Family History  Problem Relation Age of Onset   Asthma Mother    Seizures Mother    Diabetes Father    Depression Sister    Diabetes Sister        younger sister   Cancer Maternal Aunt        on dad's side   Cancer Maternal Uncle        on dad's side    Social History Social History   Tobacco Use   Smoking status: Never   Smokeless tobacco: Never  Vaping Use   Vaping Use: Never used  Substance Use Topics   Alcohol use: Not Currently   Drug use: No     Allergies    Acetaminophen   Review of Systems Review of Systems  Constitutional:  Negative for chills and fever.  Eyes:  Negative for discharge and redness.  Respiratory:  Negative for shortness of breath.   Gastrointestinal:  Positive for abdominal pain, nausea and vomiting. Negative for diarrhea.  Genitourinary:  Negative for genital sores and vaginal discharge.    Physical Exam Triage Vital Signs ED Triage Vitals  Enc Vitals Group     BP 02/09/21 0833 138/78     Pulse Rate 02/09/21 0833 97     Resp 02/09/21 0833 18     Temp 02/09/21 0833 98.8 F (37.1 C)     Temp Source 02/09/21 0833 Oral     SpO2 02/09/21 0833 98 %     Weight --      Height --      Head Circumference --      Peak Flow --      Pain Score 02/09/21 0834 0     Pain Loc --      Pain Edu? --      Excl. in [place]? --    No data found.  Updated Vital Signs BP 138/78 (BP Location: Left Arm)    Pulse 97    Temp 98.8 F (37.1 C) (Oral)    Resp 18    LMP 01/09/2021 (Approximate)    SpO2 98%    Breastfeeding No      Physical Exam Vitals and nursing note reviewed.  Constitutional:      General: She is not in acute distress.    Appearance: She is well-developed. She is not ill-appearing.  HENT:     Head: Normocephalic and atraumatic.     Nose: Nose normal.  Eyes:     Conjunctiva/sclera: Conjunctivae normal.  Cardiovascular:     Rate and Rhythm: Normal rate and regular rhythm.     Heart sounds: Normal heart sounds. No murmur heard. Pulmonary:     Effort: Pulmonary effort is normal. No respiratory distress.     Breath sounds: Normal breath sounds. No wheezing, rhonchi or rales.  Abdominal:     General: Abdomen is flat. Bowel sounds are normal. There is no distension.     Palpations: Abdomen is soft.     Tenderness: There is no abdominal tenderness. There is no guarding or rebound.  Neurological:     Mental Status: She is alert.  Psychiatric:        Mood and Affect: Mood normal.        Behavior: Behavior normal.      UC Treatments / Results  Labs (all labs ordered are listed, but only abnormal results are displayed) Labs Reviewed  POCT URINALYSIS DIP (MANUAL ENTRY) - Abnormal; Notable for the following components:      Result Value   Clarity, UA hazy (*)    Blood, UA moderate (*)    Leukocytes, UA Moderate (2+) (*)    All other components within normal limits  URINE CULTURE  POCT URINE PREGNANCY  CERVICOVAGINAL ANCILLARY ONLY    EKG   Radiology No results found.  Procedures Procedures (including critical care time)  Medications Ordered in UC Medications - No data to display  Initial Impression / Assessment and Plan / UC Course  I have reviewed the triage vital signs and the nursing notes.  Pertinent labs & imaging results that were available during my care of the patient were reviewed by me and considered in my medical decision making (see chart for details).    UA consistent with UTI- will treat with macrobid and urine culture ordered. Reassured patient that pregnancy test was negative. Will await STD screening. Encouraged follow up with any further concerns.   Final Clinical Impressions(s) / UC Diagnoses   Final diagnoses:  Acute cystitis without hematuria   Discharge Instructions   None    ED Prescriptions     Medication Sig Dispense Auth. Provider   nitrofurantoin, macrocrystal-monohydrate, (MACROBID) 100 MG capsule Take 1 capsule (100 mg total) by mouth 2 (two) times daily. 10 capsule Francene Finders, PA-C      PDMP not reviewed this encounter.   Francene Finders, PA-C 02/09/21 [phone]

## 2021-02-09 NOTE — ED Triage Notes (Signed)
Pt c/o abd pain, nausea, vomiting, light spotting, onset ~ 1 week ago. States has "tubes tied" a few months ago and concerned for complication.

## 2021-02-10 ENCOUNTER — Telehealth (HOSPITAL_COMMUNITY): Payer: Self-pay | Admitting: Emergency Medicine

## 2021-02-10 LAB — URINE CULTURE

## 2021-02-10 LAB — CERVICOVAGINAL ANCILLARY ONLY
Bacterial Vaginitis (gardnerella): NEGATIVE
Candida Glabrata: NEGATIVE
Candida Vaginitis: POSITIVE — AB
Chlamydia: NEGATIVE
Comment: NEGATIVE
Comment: NEGATIVE
Comment: NEGATIVE
Comment: NEGATIVE
Comment: NEGATIVE
Comment: NORMAL
Neisseria Gonorrhea: NEGATIVE
Trichomonas: NEGATIVE

## 2021-02-10 MED ORDER — FLUCONAZOLE 150 MG PO TABS: 150.0000 mg | ORAL_TABLET | Freq: Once | ORAL | 0 refills | Status: AC

## 2021-02-22 ENCOUNTER — Encounter: Payer: Self-pay | Admitting: Emergency Medicine

## 2021-02-22 ENCOUNTER — Ambulatory Visit
Admission: EM | Admit: 2021-02-22 | Discharge: 2021-02-22 | Disposition: A | Discharge status: Home / Self Care | Payer: Medicaid Other | Attending: Internal Medicine | Admitting: Internal Medicine

## 2021-02-22 ENCOUNTER — Other Ambulatory Visit: Payer: Self-pay

## 2021-02-22 DIAGNOSIS — N898 Other specified noninflammatory disorders of vagina: Secondary | ICD-10-CM

## 2021-02-22 DIAGNOSIS — R300 Dysuria: Secondary | ICD-10-CM | POA: Diagnosis not present

## 2021-02-22 LAB — POCT URINALYSIS DIP (MANUAL ENTRY)
Bilirubin, UA: NEGATIVE
Blood, UA: NEGATIVE
Glucose, UA: NEGATIVE mg/dL
Ketones, POC UA: NEGATIVE mg/dL
Nitrite, UA: NEGATIVE
Spec Grav, UA: 1.02 (ref 1.010–1.025)
Urobilinogen, UA: 0.2 E.U./dL
pH, UA: 8.5 — AB (ref 5.0–8.0)

## 2021-02-22 NOTE — ED Provider Notes (Addendum)
[facility]    CSN: [id number] Arrival date & time: 02/22/21  1017      History   Chief Complaint Chief Complaint  Patient presents with   Vaginitis    HPI Deborah Singleton is a 26 y.o. female.   Patient presents with vaginal discharge and urinary burning that has been present for approximately 1 week.  Patient was seen on 02/09/2021 and tested positive for vaginal yeast infection.  She was placed on Diflucan with no improvement in symptoms. Patient  reports that she has now developed a vaginal odor as well.  Vaginal discharge is clear in color and she has associated vaginal itching.  Denies urinary frequency, pelvic pain, irregular vaginal bleeding, fever, back pain, abdominal pain.  Last menstrual cycle was 02/09/2021.  Urinalysis showed moderate leukocytes on recent urgent care visit, she was also treated with Macrobid antibiotic.  Urine culture did not show any abnormalities and there was a suggestion for recollection.    Past Medical History:  Diagnosis Date   Miscarriage 06/02/2019   Seizures ([place])    last seizure Aug 2021    Patient Active Problem List   Diagnosis Date Noted   Gestational hypertension, third trimester 08/19/2020   Supervision of other normal pregnancy, antepartum 02/26/2020   Seizure disorder in pregnancy, antepartum ([place]) 02/26/2020   Obesity during pregnancy, antepartum 02/26/2020   Generalized idiopathic epilepsy and epileptic syndromes, not intractable, without status epilepticus ([place]) 04/24/2018   Maternal varicella, non-immune 04/04/2016    Past Surgical History:  Procedure Laterality Date   TOOTH EXTRACTION     TUBAL LIGATION Bilateral 08/21/2020   Procedure: POST PARTUM TUBAL LIGATION;  Surgeon: Chancy Milroy, MD;  Location: [facility] LD ORS;  Service: Gynecology;  Laterality: Bilateral;    OB History     Gravida  4   Para  3   Term  2   Preterm  1   AB  1   Living  3      SAB      IAB  1   Ectopic      Multiple   0   Live Births  3            Home Medications    Prior to Admission medications   Medication Sig Start Date End Date Taking? Authorizing Provider  levETIRAcetam (KEPPRA) 500 MG tablet Take 1/2 tablet twice a day for 3 days, then increase to 1 tablet twice a day 01/14/20  Yes Cameron Sprang, MD  ibuprofen (ADVIL) 600 MG tablet Take 1 tablet (600 mg total) by mouth every 6 (six) hours. Patient not taking: Reported on 09/30/2020 08/22/20   Nugent, Gerrie Nordmann, NP  metroNIDAZOLE (FLAGYL) 500 MG tablet Take 1 tablet (500 mg total) by mouth 2 (two) times daily. 11/04/20   Lamptey, Myrene Galas, MD  nitrofurantoin, macrocrystal-monohydrate, (MACROBID) 100 MG capsule Take 1 capsule (100 mg total) by mouth 2 (two) times daily. 02/09/21   Francene Finders, PA-C  oxyCODONE (OXY IR/ROXICODONE) 5 MG immediate release tablet Take 1 tablet (5 mg total) by mouth every 6 (six) hours as needed for moderate pain, severe pain or breakthrough pain. Patient not taking: No sig reported 08/22/20   Nugent, Gerrie Nordmann, NP  Prenatal Vit-Fe Fumarate-FA (PRENATAL MULTIVITAMIN) TABS tablet Take 1 tablet by mouth daily at 12 noon. Patient not taking: Reported on 09/30/2020    [provider]  senna-docusate (SENOKOT-S) 8.6-50 MG tablet Take 2 tablets by mouth daily. Patient not taking:  No sig reported 08/23/20   Nugent, Gerrie Nordmann, NP    Family History Family History  Problem Relation Age of Onset   Asthma Mother    Seizures Mother    Diabetes Father    Depression Sister    Diabetes Sister        younger sister   Cancer Maternal Aunt        on dad's side   Cancer Maternal Uncle        on dad's side    Social History Social History   Tobacco Use   Smoking status: Never   Smokeless tobacco: Never  Vaping Use   Vaping Use: Never used  Substance Use Topics   Alcohol use: Not Currently   Drug use: No     Allergies   Acetaminophen   Review of Systems Review of Systems Per HPI  Physical  Exam Triage Vital Signs ED Triage Vitals [02/22/21 1040]  Enc Vitals Group     BP (!) 107/57     Pulse Rate 79     Resp 18     Temp 98.3 F (36.8 C)     Temp Source Oral     SpO2 96 %     Weight 246 lb (111.6 kg)     Height 5' (1.524 m)     Head Circumference      Peak Flow      Pain Score 0     Pain Loc      Pain Edu?      Excl. in [place]?    No data found.  Updated Vital Signs BP (!) 107/57 (BP Location: Left Arm)    Pulse 79    Temp 98.3 F (36.8 C) (Oral)    Resp 18    Ht 5' (1.524 m)    Wt 246 lb (111.6 kg)    LMP 02/09/2021    SpO2 96%    Breastfeeding No    BMI 48.04 kg/m   Visual Acuity Right Eye Distance:   Left Eye Distance:   Bilateral Distance:    Right Eye Near:   Left Eye Near:    Bilateral Near:     Physical Exam Constitutional:      General: She is not in acute distress.    Appearance: Normal appearance. She is not toxic-appearing or diaphoretic.  HENT:     Head: Normocephalic and atraumatic.  Eyes:     Extraocular Movements: Extraocular movements intact.     Conjunctiva/sclera: Conjunctivae normal.  Pulmonary:     Effort: Pulmonary effort is normal.  Genitourinary:    Comments: deferred with shared decision making. Self swab performed. Neurological:     General: No focal deficit present.     Mental Status: She is alert and oriented to person, place, and time. Mental status is at baseline.  Psychiatric:        Mood and Affect: Mood normal.        Behavior: Behavior normal.        Thought Content: Thought content normal.        Judgment: Judgment normal.     UC Treatments / Results  Labs (all labs ordered are listed, but only abnormal results are displayed) Labs Reviewed  POCT URINALYSIS DIP (MANUAL ENTRY) - Abnormal; Notable for the following components:      Result Value   pH, UA 8.5 (*)    Protein Ur, POC trace (*)    Leukocytes, UA Small (1+) (*)  All other components within normal limits  URINE CULTURE  CERVICOVAGINAL ANCILLARY  ONLY    EKG   Radiology No results found.  Procedures Procedures (including critical care time)  Medications Ordered in UC Medications - No data to display  Initial Impression / Assessment and Plan / UC Course  I have reviewed the triage vital signs and the nursing notes.  Pertinent labs & imaging results that were available during my care of the patient were reviewed by me and considered in my medical decision making (see chart for details).     Urinalysis still showing moderate leukocytes.  Will await urine culture and vaginal swab for treatment due to persistent symptoms to ensure adequate treatment.  Patient to refrain from sexual activity at this time until test results and treatment are complete.  Discussed return precautions.  Patient verbalized understanding and was agreeable with plan. Final Clinical Impressions(s) / UC Diagnoses   Final diagnoses:  Vaginal discharge  Dysuria     Discharge Instructions      Your vaginal swab and urine culture are pending.  We will call if there are any abnormalities and treat as appropriate.  Please refrain from sexual activity until test results and treatment are complete.     ED Prescriptions   None    PDMP not reviewed this encounter.   Teodora Medici, [place] 02/22/21 [place], [place], [place] 02/22/21 1136

## 2021-02-22 NOTE — ED Triage Notes (Signed)
Pt recently treated for a yeast infection, completed both tablets of Diflucan.  Patient is still c/o vaginal itching, clear discharge, some odor.

## 2021-02-22 NOTE — Discharge Instructions (Signed)
Your vaginal swab and urine culture are pending.  We will call if there are any abnormalities and treat as appropriate.  Please refrain from sexual activity until test results and treatment are complete.

## 2021-02-23 ENCOUNTER — Telehealth (HOSPITAL_COMMUNITY): Payer: Self-pay | Admitting: Emergency Medicine

## 2021-02-23 LAB — CERVICOVAGINAL ANCILLARY ONLY
Bacterial Vaginitis (gardnerella): POSITIVE — AB
Candida Glabrata: NEGATIVE
Candida Vaginitis: POSITIVE — AB
Chlamydia: NEGATIVE
Comment: NEGATIVE
Comment: NEGATIVE
Comment: NEGATIVE
Comment: NEGATIVE
Comment: NEGATIVE
Comment: NORMAL
Neisseria Gonorrhea: NEGATIVE
Trichomonas: NEGATIVE

## 2021-02-23 LAB — URINE CULTURE: Culture: >=100000 — AB

## 2021-02-23 MED ORDER — METRONIDAZOLE 500 MG PO TABS: 500.0000 mg | ORAL_TABLET | Freq: Two times a day (BID) | ORAL | 0 refills | Status: DC

## 2021-02-23 MED ORDER — FLUCONAZOLE 150 MG PO TABS: 150.0000 mg | ORAL_TABLET | Freq: Once | ORAL | 0 refills | Status: AC

## 2021-03-29 IMAGING — US US MFM OB FOLLOW-UP
1 series · 13 of 28 positions shown · non-contrast
Comparison: none

[Series 1: us mfm ob follow-up · 13 of 50 slices shown]
[im 2/50]
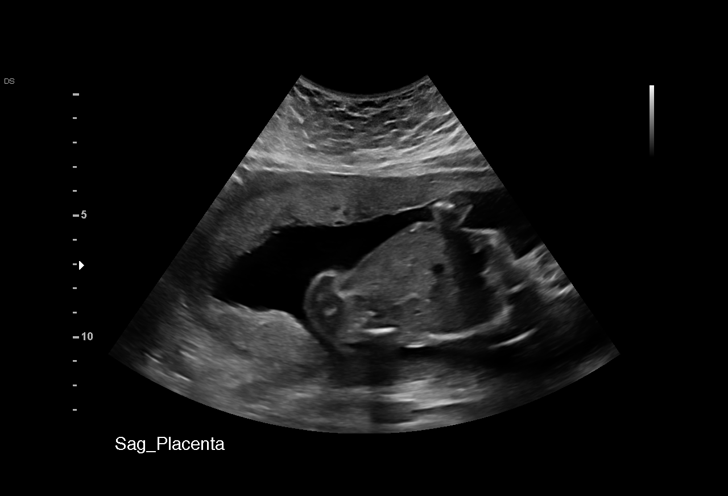
[im 6/50]
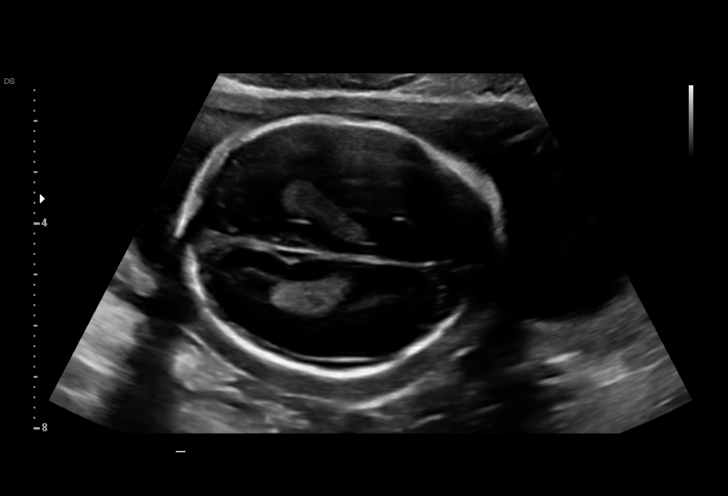
[im 10/50]
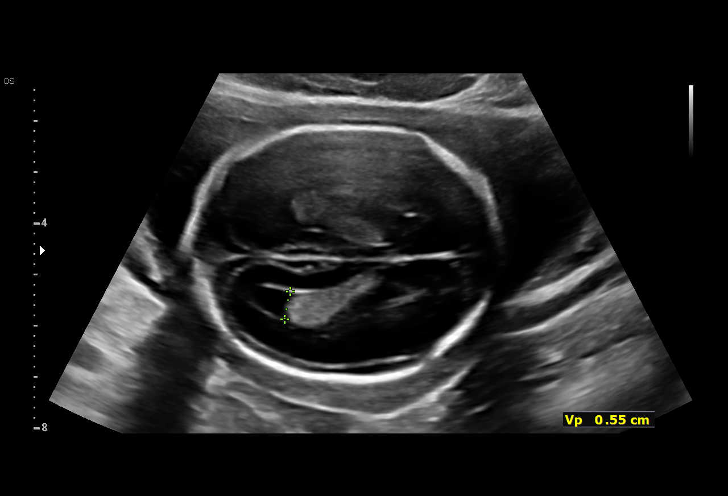
[im 13/50]
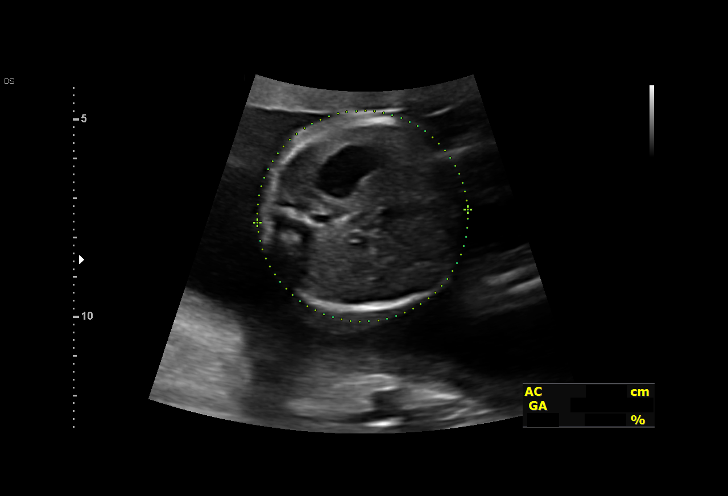
[im 17/50]
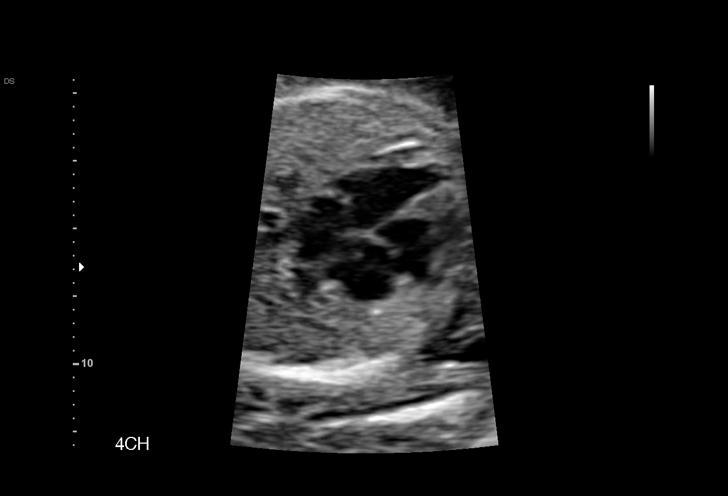
[im 20/50]
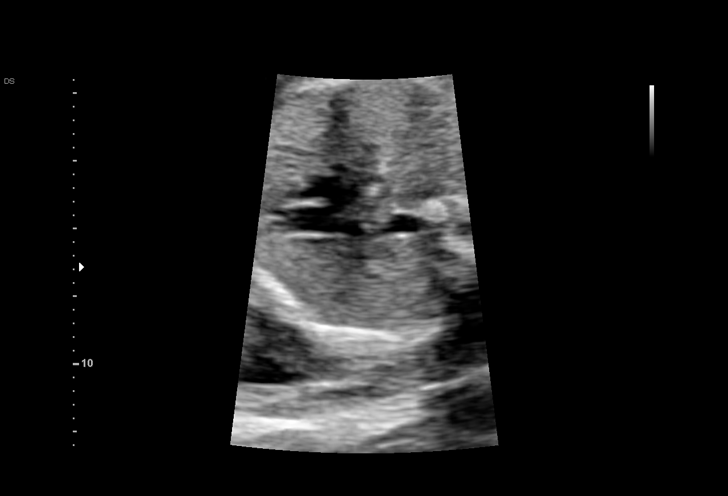
[im 26/50]
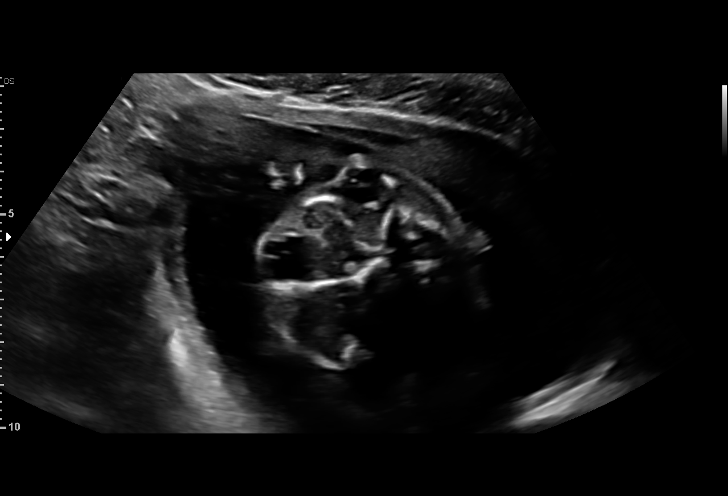
[im 30/50]
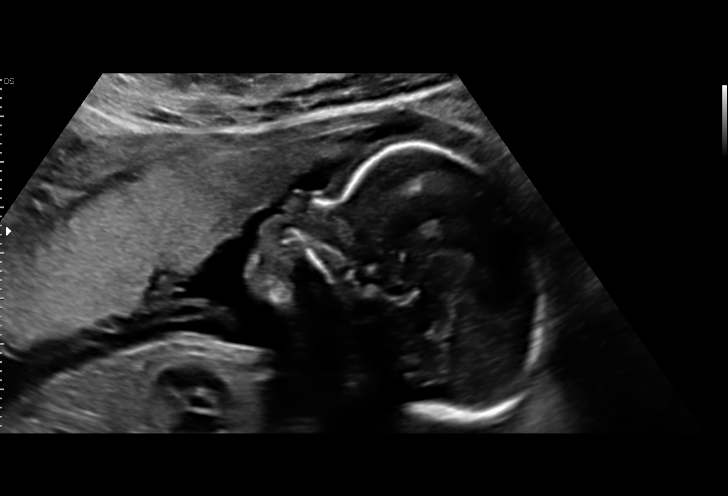
[im 33/50]
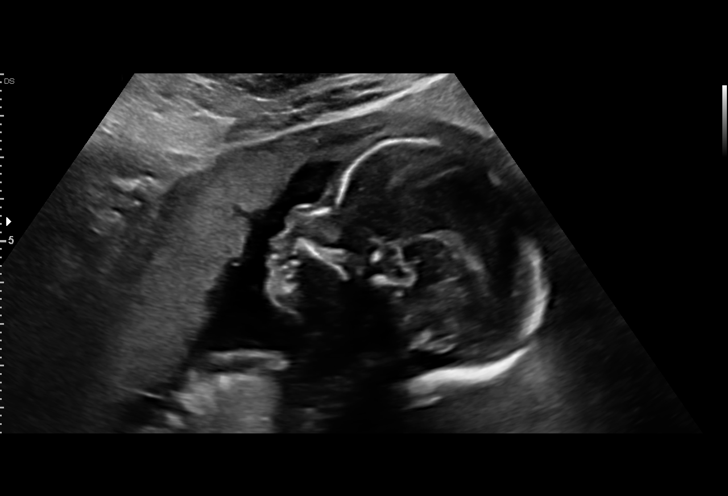
[im 37/50]
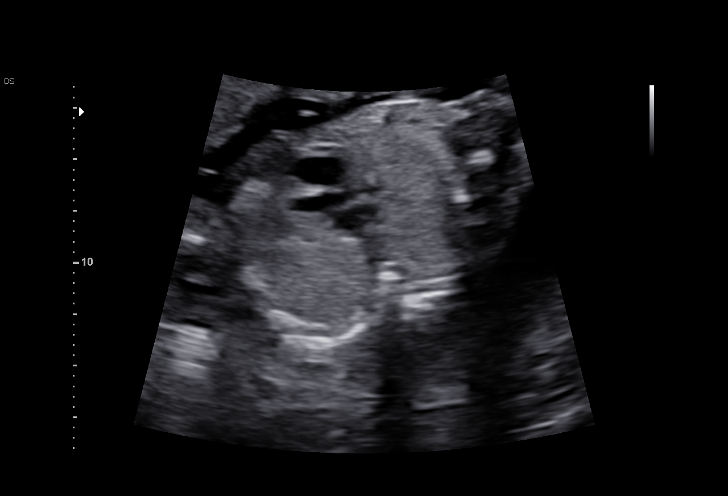
[im 40/50]
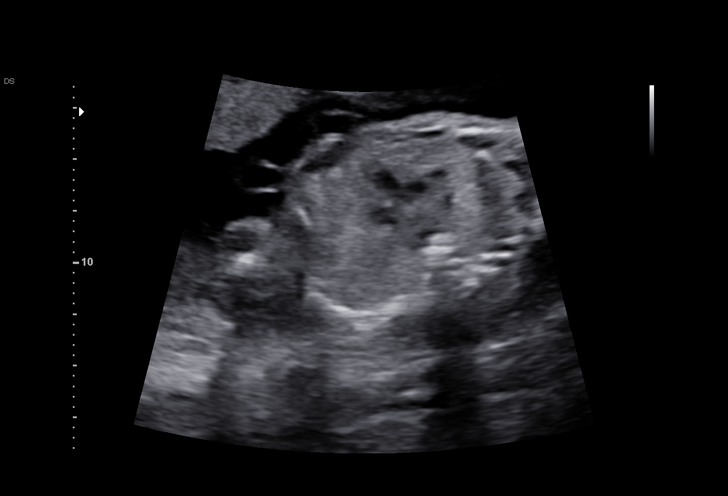
[im 44/50]
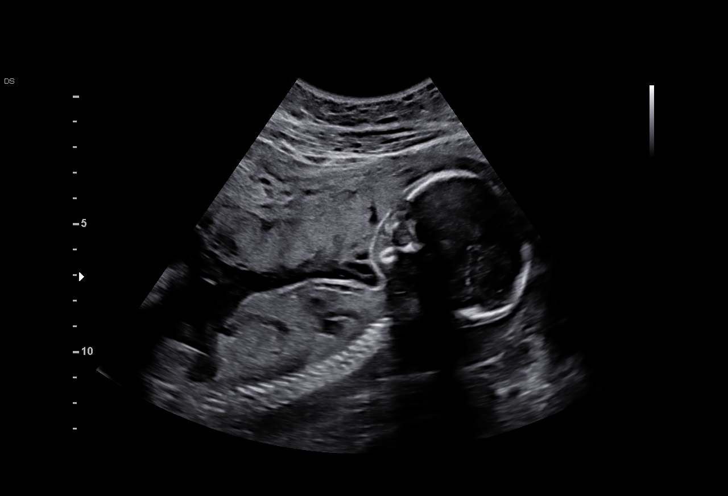
[im 48/50]
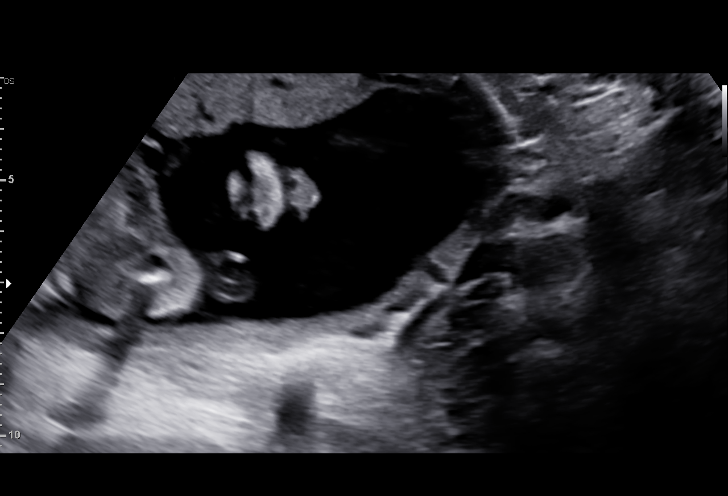

[13 of 28 positions shown; findings below may reference images not displayed]

DX CNM

Indications

 Obesity complicating pregnancy, second
 trimester
 Seizure disorder, on Keppra
 21 weeks gestation of pregnancy
 Low-risk NIPS, Neg AFP
Fetal Evaluation

 Num Of Fetuses:         1
 Cardiac Activity:       Observed
 Presentation:           Cephalic
 Placenta:               Anterior Fundal
 P. Cord Insertion:      Previously Visualized

 Amniotic Fluid
 AFI FV:      Within normal limits

                             Largest Pocket(cm)

Biometry

 BPD:      50.1  mm     G. Age:  21w 1d         50  %    CI:        73.21   %    70 - 86
                                                         FL/HC:      19.3   %    15.9 -
 HC:      186.1  mm     G. Age:  21w 0d         31  %    HC/AC:      1.14        1.06 -
 AC:      163.1  mm     G. Age:  21w 3d         51  %    FL/BPD:     71.7   %
 FL:       35.9  mm     G. Age:  21w 3d         49  %    FL/AC:      22.0   %    20 - 24
 CER:      21.3  mm     G. Age:  20w 1d         31  %

 LV:        5.5  mm

 Est. FW:     416  gm    0 lb 15 oz      55  %
OB History

 Gravidity:    4         Term:   1
Gestational Age

 LMP:           24w 1d        Date:  11/06/19                 EDD:   08/12/20
 U/S Today:     21w 2d                                        EDD:   09/01/20
 Best:          21w 1d     Det. By:  U/S  (03/26/20)          EDD:   09/02/20
Anatomy

 Cranium:               Appears normal         LVOT:                   Appears normal
 Cavum:                 Appears normal         Aortic Arch:            Appears normal
 Ventricles:            Appears normal         Ductal Arch:            Appears normal
 Choroid Plexus:        Appears normal         Diaphragm:              Previously seen
 Cerebellum:            Appears normal         Stomach:                Appears normal, left
                                                                       sided
 Posterior Fossa:       Appears normal         Abdomen:                Previously seen
 Nuchal Fold:           Not applicable (>20    Abdominal Wall:         Previously seen
                        wks GA)
 Face:                  Appears normal         Cord Vessels:           Previously seen
                        (orbits and profile)
 Lips:                  Appears normal         Kidneys:                Appear normal
 Palate:                Appears normal         Bladder:                Appears normal
 Thoracic:              Previously seen        Spine:                  Previously seen
 Heart:                 Appears normal         Upper Extremities:      Previously seen
                        (4CH, axis, and
                        situs)
 RVOT:                  Appears normal         Lower Extremities:      Previously seen

 Other:  Male gender previously seen. Nasal bone visualized. Technically
         difficult due to maternal habitus. VC, 3VV and 3VTV visualized.
Cervix Uterus Adnexa

 Cervix
 Length:            4.5  cm.
 Normal appearance by transabdominal scan.
Comments

 This patient was seen for a follow up exam as the views of
 the fetal anatomy were unable to be fully visualized during
 her last exam due to maternal obesity with a BMI of 43.  She
 denies any problems since her last exam.
 She was informed that the fetal growth and amniotic fluid
 level appears appropriate for her gestational age.
 The views of the fetal anatomy were visualized today.  There
 were no obvious anomalies noted.
 The limitations of ultrasound in the detection of all anomalies
 was discussed.
 Due to maternal obesity, a follow-up exam was scheduled in
 5 weeks.

## 2021-05-03 IMAGING — US US MFM OB FOLLOW-UP
1 series · 14 of 28 positions shown · non-contrast
Comparison: none

[Series 1: us mfm ob follow-up · 14 of 44 slices shown]
[im 2/44]
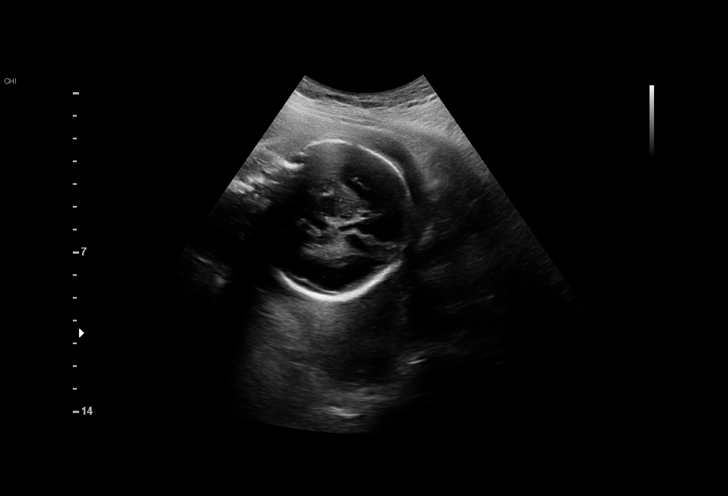
[im 5/44]
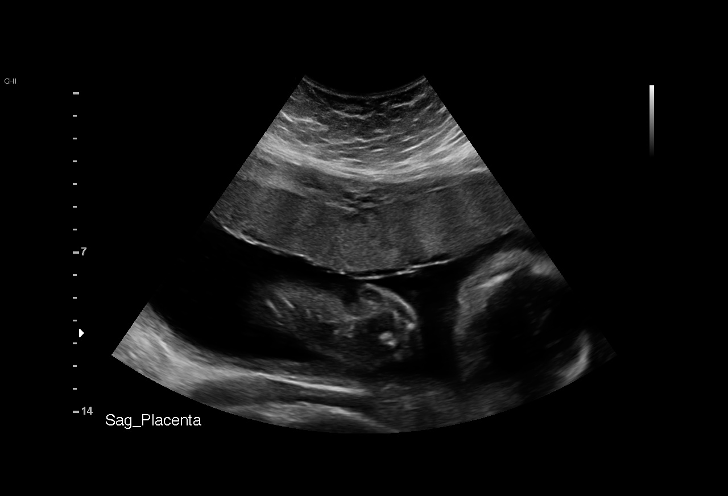
[im 8/44]
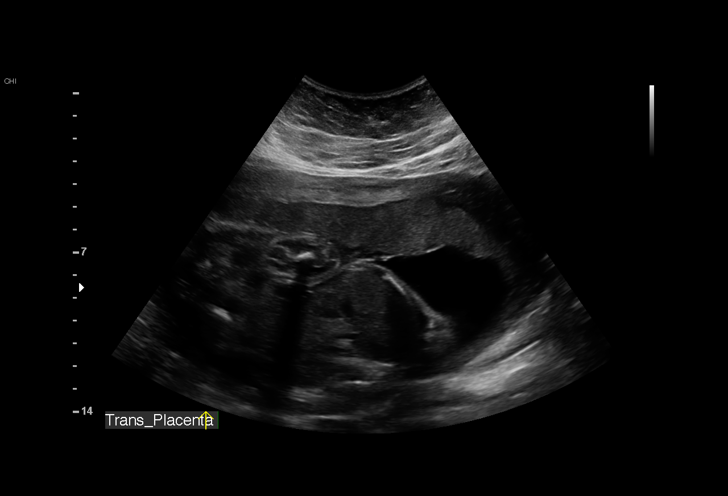
[im 12/44]
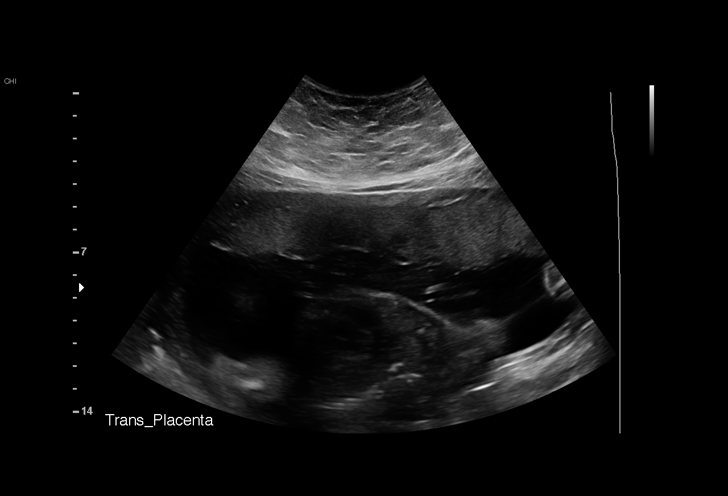
[im 15/44]
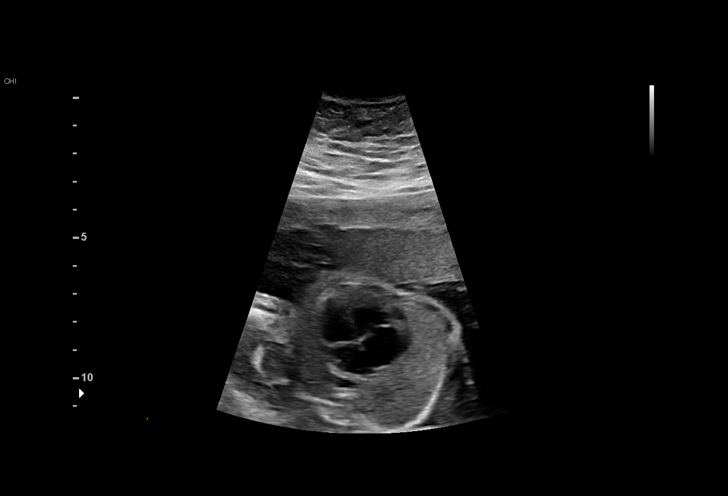
[im 18/44]
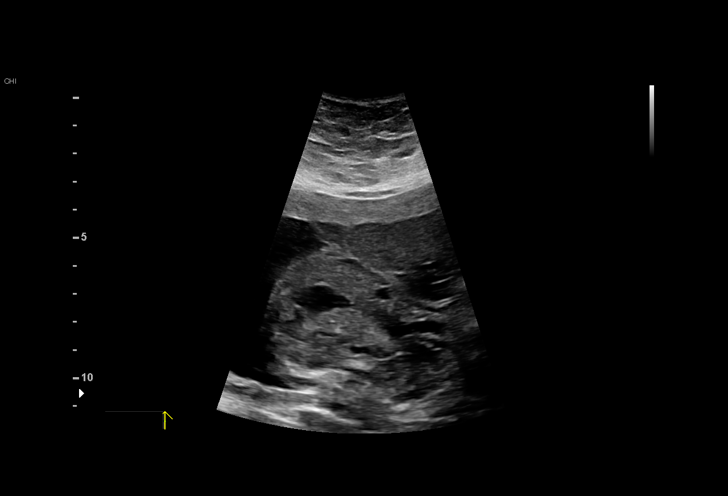
[im 21/44]
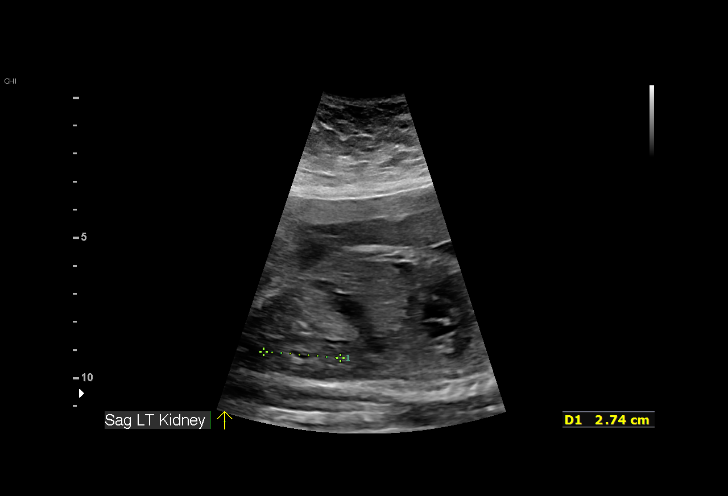
[im 24/44]
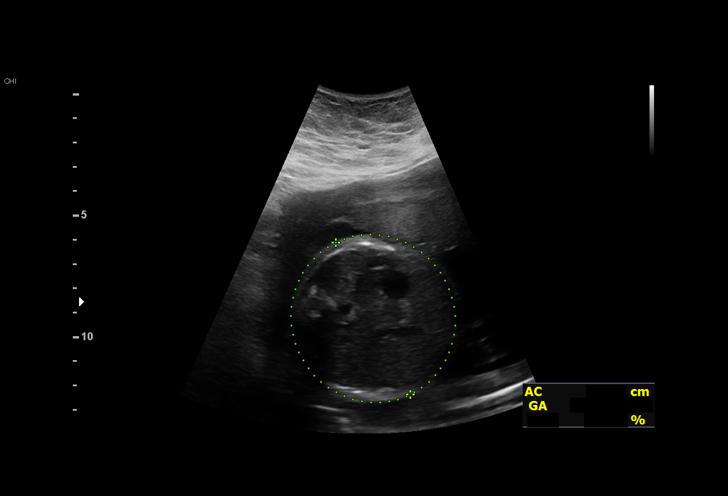
[im 28/44]
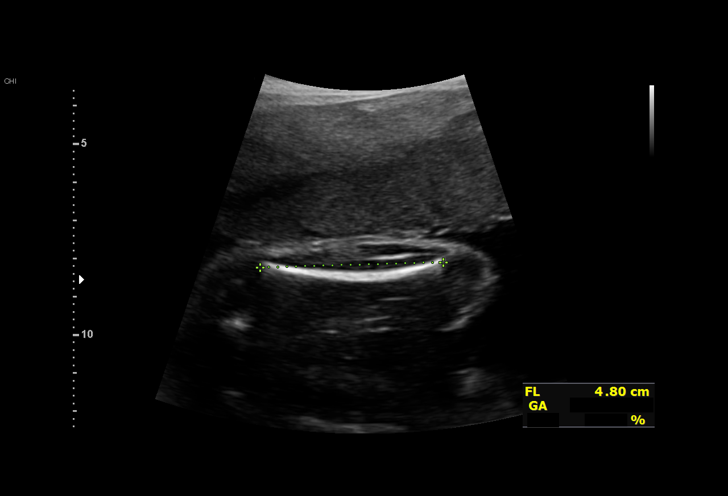
[im 31/44]
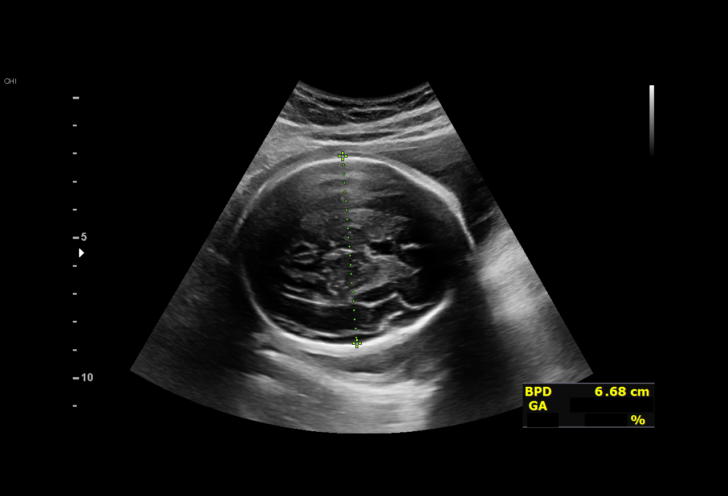
[im 34/44]
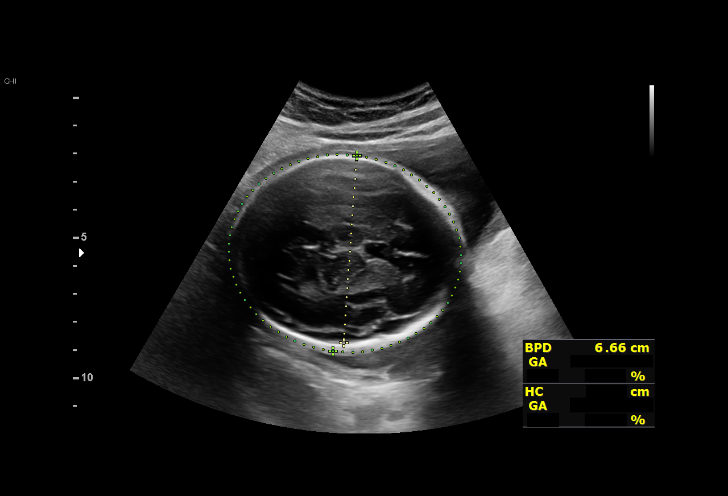
[im 37/44]
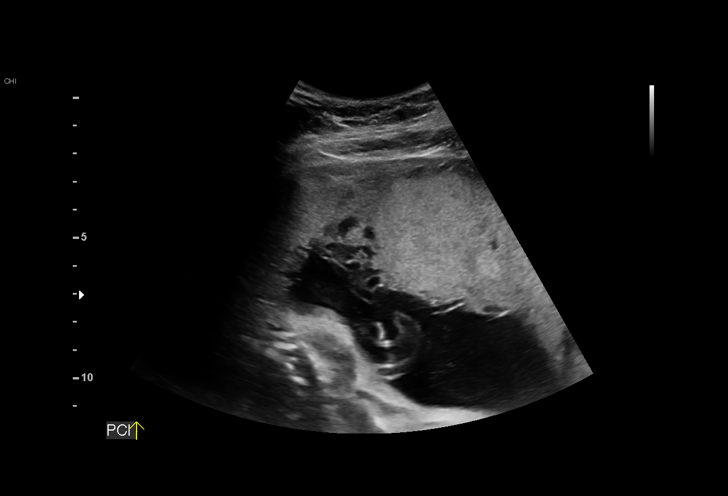
[im 40/44]
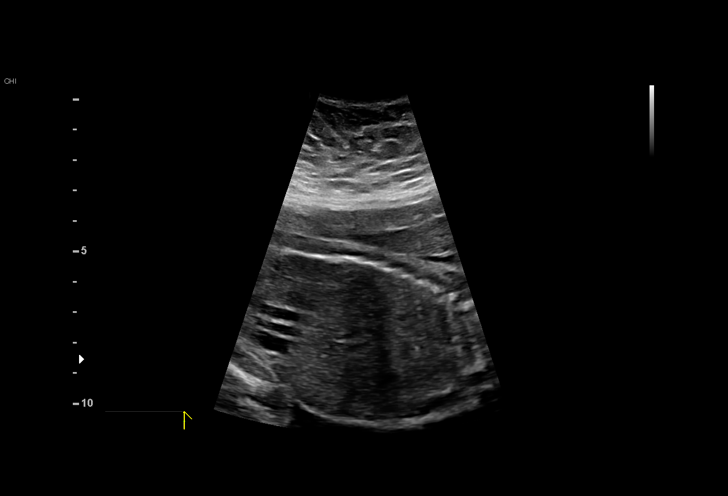
[im 44/44]
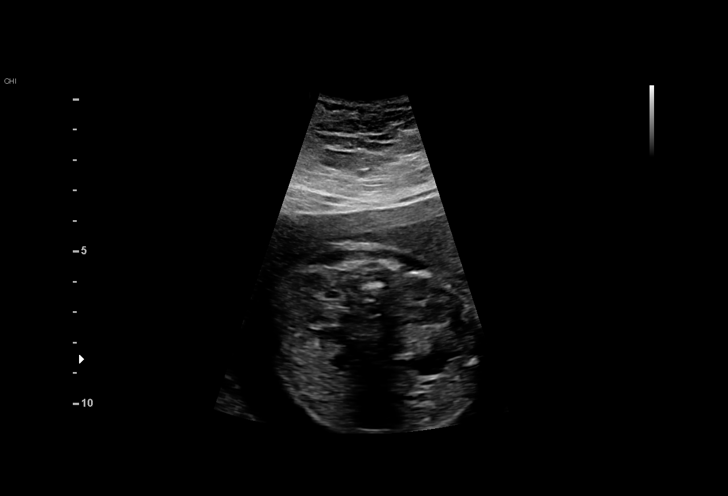

[14 of 28 positions shown; findings below may reference images not displayed]

ALEXEY CNM

Indications

 Obesity complicating pregnancy, second
 trimester BMI=46
 Seizure disorder, on Keppra
 Low-risk NIPS, Neg AFP
 26 weeks gestation of pregnancy
Fetal Evaluation

 Num Of Fetuses:          1
 Fetal Heart Rate(bpm):   140
 Cardiac Activity:        Observed
 Presentation:            Cephalic
 Placenta:                Anterior Fundal
 P. Cord Insertion:       Previously Visualized

 Amniotic Fluid
 AFI FV:      Within normal limits

                             Largest Pocket(cm)

Biometry

 BPD:      66.6  mm     G. Age:  26w 6d         64  %    CI:        76.36   %    70 - 86
                                                         FL/HC:       19.9  %    18.6 -
 HC:      241.5  mm     G. Age:  26w 2d         27  %    HC/AC:       1.12       1.04 -
 AC:      215.7  mm     G. Age:  26w 0d         38  %    FL/BPD:      72.2  %    71 - 87
 FL:       48.1  mm     G. Age:  26w 1d         35  %    FL/AC:       22.3  %    20 - 24
 LV:        2.4  mm

 Est. FW:     897   gm          2 lb     38  %
OB History

 Gravidity:    4         Term:   1
Gestational Age

 LMP:           29w 1d        Date:  11/06/19                 EDD:   08/12/20
 U/S Today:     26w 2d                                        EDD:   09/01/20
 Best:          26w 1d     Det. By:  U/S  (03/26/20)          EDD:   09/02/20
Anatomy

 Cranium:               Appears normal         LVOT:                   Appears normal
 Cavum:                 Appears normal         Aortic Arch:            Previously seen
 Ventricles:            Appears normal         Ductal Arch:            Previously seen
 Choroid Plexus:        Previously seen        Diaphragm:              Appears normal
 Cerebellum:            Previously seen        Stomach:                Appears normal, left
                                                                       sided
 Posterior Fossa:       Previously seen        Abdomen:                Appears normal
 Nuchal Fold:           Not applicable (>20    Abdominal Wall:         Previously seen
                        wks GA)
 Face:                  Orbits and profile     Cord Vessels:           Previously seen
                        previously seen
 Lips:                  Previously seen        Kidneys:                Appear normal
 Palate:                Previously seen        Bladder:                Appears normal
 Thoracic:              Previously seen        Spine:                  Previously seen
 Heart:                 Appears normal         Upper Extremities:      Previously seen
                        (4CH, axis, and
                        situs)
 RVOT:                  Previously seen        Lower Extremities:      Previously seen

 Other:  Male gender previously seen. Nasal bone previously visualized. VC,
         3VV and 3VTV previously visualized.
Cervix Uterus Adnexa

 Cervix
 Not visualized (advanced GA >64wks)

 Uterus
 No abnormality visualized.
Impression

 Follow up growth due to elevated BMI
 Normal interval growth with measurements consistent with
 dates
 Good fetal movement and amniotic fluid volume
Recommendations

 Follow up growth in 4-6 weeks.

## 2021-05-15 ENCOUNTER — Ambulatory Visit
Admission: EM | Admit: 2021-05-15 | Discharge: 2021-05-15 | Disposition: A | Discharge status: Home / Self Care | Payer: Medicaid Other | Attending: Physician Assistant | Admitting: Physician Assistant

## 2021-05-15 DIAGNOSIS — J020 Streptococcal pharyngitis: Secondary | ICD-10-CM | POA: Diagnosis not present

## 2021-05-15 LAB — POCT RAPID STREP A (OFFICE): Rapid Strep A Screen: POSITIVE — AB

## 2021-05-15 MED ORDER — PENICILLIN V POTASSIUM 500 MG PO TABS: 500.0000 mg | ORAL_TABLET | Freq: Two times a day (BID) | ORAL | 0 refills | Status: AC

## 2021-05-15 NOTE — Discharge Instructions (Signed)
Take medication as prescribed ?Discard toothbrush on day 3 ?

## 2021-05-15 NOTE — ED Triage Notes (Signed)
Patient presents to Urgent Care with complaints of sore throat x 2 days ago. Treating with dayquil, tea, and chloraseptic spray with no improvement.  ? ?Denies fever.  ?

## 2021-05-15 NOTE — ED Provider Notes (Signed)
?[facility] ? ? ? ?CSN: [id number] ?Arrival date & time: 05/15/21  8101 ? ? ?  ? ?History   ?Chief Complaint ?Chief Complaint  ?Patient presents with  ? Sore Throat  ? ? ?HPI ?Deborah Singleton is a 26 y.o. female.  ? ?Patient here concerned with sore throat x 3 days.  Admits anterior cervical LAD.  Denies f/c, URI sx, cough, wheezing, SOB.  Taking ibuprofen and tylenol w/o relief.  Chloraseptic throat spray w/o relief.  ? ? ?Past Medical History:  ?Diagnosis Date  ? Miscarriage 06/02/2019  ? Seizures (HCC)   ? last seizure Aug 2021  ? ? ?Patient Active Problem List  ? Diagnosis Date Noted  ? Gestational hypertension, third trimester 08/19/2020  ? Supervision of other normal pregnancy, antepartum 02/26/2020  ? Seizure disorder in pregnancy, antepartum (HCC) 02/26/2020  ? Obesity during pregnancy, antepartum 02/26/2020  ? Generalized idiopathic epilepsy and epileptic syndromes, not intractable, without status epilepticus (HCC) 04/24/2018  ? Maternal varicella, non-immune 04/04/2016  ? ? ?Past Surgical History:  ?Procedure Laterality Date  ? TOOTH EXTRACTION    ? TUBAL LIGATION Bilateral 08/21/2020  ? Procedure: POST PARTUM TUBAL LIGATION;  Surgeon: Hermina Staggers, MD;  Location: [facility];  Service: Gynecology;  Laterality: Bilateral;  ? ? ?OB History   ? ? Gravida  ?4  ? Para  ?3  ? Term  ?2  ? Preterm  ?1  ? AB  ?1  ? Living  ?3  ?  ? ? SAB  ?   ? IAB  ?1  ? Ectopic  ?   ? Multiple  ?0  ? Live Births  ?3  ?   ?  ?  ? ? ? ?Home Medications   ? ?Prior to Admission medications   ?Medication Sig Start Date End Date Taking? Authorizing Provider  ?penicillin v potassium (VEETID) 500 MG tablet Take 1 tablet (500 mg total) by mouth 2 (two) times daily for 7 days. 05/15/21 05/22/21 Yes Evern Core, PA-C  ?ibuprofen (ADVIL) 600 MG tablet Take 1 tablet (600 mg total) by mouth every 6 (six) hours. ?Patient not taking: Reported on 09/30/2020 08/22/20   Nugent, Odie Sera, NP  ?levETIRAcetam (KEPPRA) 500 MG tablet Take  1/2 tablet twice a day for 3 days, then increase to 1 tablet twice a day 01/14/20   Van Clines, MD  ?metroNIDAZOLE (FLAGYL) 500 MG tablet Take 1 tablet (500 mg total) by mouth 2 (two) times daily. 02/23/21   LampteyBritta Mccreedy, MD  ?nitrofurantoin, macrocrystal-monohydrate, (MACROBID) 100 MG capsule Take 1 capsule (100 mg total) by mouth 2 (two) times daily. 02/09/21   Tomi Bamberger, PA-C  ?oxyCODONE (OXY IR/ROXICODONE) 5 MG immediate release tablet Take 1 tablet (5 mg total) by mouth every 6 (six) hours as needed for moderate pain, severe pain or breakthrough pain. ?Patient not taking: No sig reported 08/22/20   Nugent, Odie Sera, NP  ?Prenatal Vit-Fe Fumarate-FA (PRENATAL MULTIVITAMIN) TABS tablet Take 1 tablet by mouth daily at 12 noon. ?Patient not taking: Reported on 09/30/2020    [provider]  ?senna-docusate (SENOKOT-S) 8.6-50 MG tablet Take 2 tablets by mouth daily. ?Patient not taking: No sig reported 08/23/20   Nugent, Odie Sera, NP  ? ? ?Family History ?Family History  ?Problem Relation Age of Onset  ? Asthma Mother   ? Seizures Mother   ? Diabetes Father   ? Depression Sister   ? Diabetes Sister   ?  younger sister  ? Cancer Maternal Aunt   ?     on dad's side  ? Cancer Maternal Uncle   ?     on dad's side  ? ? ?Social History ?Social History  ? ?Tobacco Use  ? Smoking status: Never  ? Smokeless tobacco: Never  ?Vaping Use  ? Vaping Use: Never used  ?Substance Use Topics  ? Alcohol use: Not Currently  ? Drug use: No  ? ? ? ?Allergies   ?Acetaminophen ? ? ?Review of Systems ?Review of Systems  ?Constitutional:  Negative for chills, fatigue and fever.  ?HENT:  Positive for sore throat. Negative for congestion, ear pain, nosebleeds, postnasal drip, rhinorrhea, sinus pressure and sinus pain.   ?Eyes:  Negative for pain and redness.  ?Respiratory:  Negative for cough, shortness of breath and wheezing.   ?Gastrointestinal:  Negative for abdominal pain, diarrhea, nausea and vomiting.   ?Musculoskeletal:  Negative for arthralgias and myalgias.  ?Skin:  Negative for rash.  ?Neurological:  Negative for light-headedness and headaches.  ?Hematological:  Positive for adenopathy. Does not bruise/bleed easily.  ?Psychiatric/Behavioral:  Negative for confusion and sleep disturbance.   ? ? ?Physical Exam ?Triage Vital Signs ?ED Triage Vitals  ?Enc Vitals Group  ?   BP 05/15/21 1204 110/76  ?   Pulse Rate 05/15/21 1204 86  ?   Resp 05/15/21 1204 16  ?   Temp 05/15/21 1204 98 ?F (36.7 ?C)  ?   Temp Source 05/15/21 1204 Oral  ?   SpO2 05/15/21 1204 97 %  ?   Weight --   ?   Height --   ?   Head Circumference --   ?   Peak Flow --   ?   Pain Score 05/15/21 1203 10  ?   Pain Loc --   ?   Pain Edu? --   ?   Excl. in GC? --   ? ?No data found. ? ?Updated Vital Signs ?BP 110/76 (BP Location: Left Arm)   Pulse 86   Temp 98 ?F (36.7 ?C) (Oral)   Resp 16   LMP 05/08/2021   SpO2 97%  ? ?Visual Acuity ?Right Eye Distance:   ?Left Eye Distance:   ?Bilateral Distance:   ? ?Right Eye Near:   ?Left Eye Near:    ?Bilateral Near:    ? ?Physical Exam ?Vitals and nursing note reviewed.  ?Constitutional:   ?   General: She is not in acute distress. ?   Appearance: Normal appearance. She is not ill-appearing.  ?HENT:  ?   Head: Normocephalic and atraumatic.  ?   Nose: No congestion or rhinorrhea.  ?   Mouth/Throat:  ?   Pharynx: Oropharynx is clear. Uvula midline. Posterior oropharyngeal erythema present. No pharyngeal swelling, oropharyngeal exudate or uvula swelling.  ?   Tonsils: Tonsillar exudate present. 2+ on the right. 2+ on the left.  ?Eyes:  ?   General: No scleral icterus. ?   Extraocular Movements: Extraocular movements intact.  ?   Conjunctiva/sclera: Conjunctivae normal.  ?Cardiovascular:  ?   Rate and Rhythm: Normal rate and regular rhythm.  ?   Heart sounds: No murmur heard. ?Pulmonary:  ?   Effort: Pulmonary effort is normal. No respiratory distress.  ?   Breath sounds: Normal breath sounds. No wheezing or  rales.  ?Musculoskeletal:  ?   Cervical back: Normal range of motion. No rigidity.  ?Skin: ?   Coloration: Skin is not jaundiced.  ?  Findings: No rash.  ?Neurological:  ?   General: No focal deficit present.  ?   Mental Status: She is alert and oriented to person, place, and time.  ?   Motor: No weakness.  ?   Gait: Gait normal.  ?Psychiatric:     ?   Mood and Affect: Mood normal.     ?   Behavior: Behavior normal.  ? ? ? ?UC Treatments / Results  ?Labs ?(all labs ordered are listed, but only abnormal results are displayed) ?Labs Reviewed  ?POCT RAPID STREP A (OFFICE) - Abnormal; Notable for the following components:  ?    Result Value  ? Rapid Strep A Screen Positive (*)   ? All other components within normal limits  ? ? ?EKG ? ? ?Radiology ?No results found. ? ?Procedures ?Procedures (including critical care time) ? ?Medications Ordered in UC ?Medications - No data to display ? ?Initial Impression / Assessment and Plan / UC Course  ?I have reviewed the triage vital signs and the nursing notes. ? ?Pertinent labs & imaging results that were available during my care of the patient were reviewed by me and considered in my medical decision making (see chart for details). ? ?  ? ?Take mediation as prescribe ?Discard toothbrush on day 3 ?Final Clinical Impressions(s) / UC Diagnoses  ? ?Final diagnoses:  ?Streptococcal sore throat  ? ? ? ?Discharge Instructions   ? ?  ?Take medication as prescribed ?Discard toothbrush on day 3 ? ? ? ?ED Prescriptions   ? ? Medication Sig Dispense Auth. Provider  ? penicillin v potassium (VEETID) 500 MG tablet Take 1 tablet (500 mg total) by mouth 2 (two) times daily for 7 days. 14 tablet Evern Core, PA-C  ? ?  ? ?PDMP not reviewed this encounter. ?  ?Evern Core, PA-C ?05/15/21 1304 ? ?

## 2021-06-20 ENCOUNTER — Ambulatory Visit
Admission: RE | Admit: 2021-06-20 | Discharge: 2021-06-20 | Disposition: A | Discharge status: Home / Self Care | Payer: Medicaid Other | Source: Ambulatory Visit | Attending: Family Medicine | Admitting: Family Medicine

## 2021-06-20 VITALS — BP 113/75 | HR 87 | Temp 97.7°F | Resp 18

## 2021-06-20 DIAGNOSIS — Z113 Encounter for screening for infections with a predominantly sexual mode of transmission: Secondary | ICD-10-CM | POA: Diagnosis not present

## 2021-06-20 DIAGNOSIS — N898 Other specified noninflammatory disorders of vagina: Secondary | ICD-10-CM | POA: Diagnosis not present

## 2021-06-20 NOTE — ED Provider Notes (Signed)
[facility]    CSN: [id number] Arrival date & time: 06/20/21  0840      History   Chief Complaint Chief Complaint  Patient presents with   SEXUALLY TRANSMITTED DISEASE    HPI Deborah Singleton is a 26 y.o. female.   Presenting today with about a week of clear to white vaginal discharge, vaginal odor, lower abdominal pain.  Denies dysuria, hematuria, urinary frequency, rashes, lesions, fever, chills, back pain.  Is having unprotected sex with a partner and wanting to get tested for STIs.  Not trying anything over-the-counter for symptoms.   Past Medical History:  Diagnosis Date   Miscarriage 06/02/2019   Seizures (HCC)    last seizure Aug 2021    Patient Active Problem List   Diagnosis Date Noted   Gestational hypertension, third trimester 08/19/2020   Supervision of other normal pregnancy, antepartum 02/26/2020   Seizure disorder in pregnancy, antepartum (HCC) 02/26/2020   Obesity during pregnancy, antepartum 02/26/2020   Generalized idiopathic epilepsy and epileptic syndromes, not intractable, without status epilepticus (HCC) 04/24/2018   Maternal varicella, non-immune 04/04/2016    Past Surgical History:  Procedure Laterality Date   TOOTH EXTRACTION     TUBAL LIGATION Bilateral 08/21/2020   Procedure: POST PARTUM TUBAL LIGATION;  Surgeon: Hermina Staggers, MD;  Location: [facility];  Service: Gynecology;  Laterality: Bilateral;    OB History     Gravida  4   Para  3   Term  2   Preterm  1   AB  1   Living  3      SAB      IAB  1   Ectopic      Multiple  0   Live Births  3            Home Medications    Prior to Admission medications   Medication Sig Start Date End Date Taking? Authorizing Provider  ibuprofen (ADVIL) 600 MG tablet Take 1 tablet (600 mg total) by mouth every 6 (six) hours. Patient not taking: Reported on 09/30/2020 08/22/20   Nugent, Odie Sera, NP  levETIRAcetam (KEPPRA) 500 MG tablet Take 1/2 tablet twice a day  for 3 days, then increase to 1 tablet twice a day 01/14/20   Van Clines, MD  metroNIDAZOLE (FLAGYL) 500 MG tablet Take 1 tablet (500 mg total) by mouth 2 (two) times daily. 02/23/21   Lamptey, Britta Mccreedy, MD  nitrofurantoin, macrocrystal-monohydrate, (MACROBID) 100 MG capsule Take 1 capsule (100 mg total) by mouth 2 (two) times daily. 02/09/21   Tomi Bamberger, PA-C  oxyCODONE (OXY IR/ROXICODONE) 5 MG immediate release tablet Take 1 tablet (5 mg total) by mouth every 6 (six) hours as needed for moderate pain, severe pain or breakthrough pain. Patient not taking: No sig reported 08/22/20   Nugent, Odie Sera, NP  Prenatal Vit-Fe Fumarate-FA (PRENATAL MULTIVITAMIN) TABS tablet Take 1 tablet by mouth daily at 12 noon. Patient not taking: Reported on 09/30/2020    [provider]  senna-docusate (SENOKOT-S) 8.6-50 MG tablet Take 2 tablets by mouth daily. Patient not taking: No sig reported 08/23/20   Nugent, Odie Sera, NP    Family History Family History  Problem Relation Age of Onset   Asthma Mother    Seizures Mother    Diabetes Father    Depression Sister    Diabetes Sister        younger sister   Cancer Maternal Aunt  on dad's side   Cancer Maternal Uncle        on dad's side    Social History Social History   Tobacco Use   Smoking status: Never   Smokeless tobacco: Never  Vaping Use   Vaping Use: Never used  Substance Use Topics   Alcohol use: Not Currently   Drug use: No     Allergies   Acetaminophen   Review of Systems Review of Systems Per HPI  Physical Exam Triage Vital Signs ED Triage Vitals  Enc Vitals Group     BP 06/20/21 0851 113/75     Pulse Rate 06/20/21 0851 87     Resp 06/20/21 0851 18     Temp 06/20/21 0851 97.7 F (36.5 C)     Temp Source 06/20/21 0851 Oral     SpO2 06/20/21 0851 95 %     Weight --      Height --      Head Circumference --      Peak Flow --      Pain Score 06/20/21 0850 0     Pain Loc --      Pain Edu? --       Excl. in GC? --    No data found.  Updated Vital Signs BP 113/75 (BP Location: Right Arm)   Pulse 87   Temp 97.7 F (36.5 C) (Oral)   Resp 18   LMP 05/31/2021 (Approximate)   SpO2 95%   Visual Acuity Right Eye Distance:   Left Eye Distance:   Bilateral Distance:    Right Eye Near:   Left Eye Near:    Bilateral Near:     Physical Exam Vitals and nursing note reviewed.  Constitutional:      Appearance: Normal appearance. She is not ill-appearing.  HENT:     Head: Atraumatic.     Mouth/Throat:     Mouth: Mucous membranes are moist.     Pharynx: Oropharynx is clear.  Eyes:     Extraocular Movements: Extraocular movements intact.     Conjunctiva/sclera: Conjunctivae normal.  Cardiovascular:     Rate and Rhythm: Normal rate and regular rhythm.     Heart sounds: Normal heart sounds.  Pulmonary:     Effort: Pulmonary effort is normal.     Breath sounds: Normal breath sounds.  Abdominal:     General: Bowel sounds are normal. There is no distension.     Palpations: Abdomen is soft.     Tenderness: There is no abdominal tenderness. There is no guarding.  Genitourinary:    Comments: GU exam deferred, self swab performed Musculoskeletal:        General: Normal range of motion.     Cervical back: Normal range of motion and neck supple.  Skin:    General: Skin is warm and dry.  Neurological:     Mental Status: She is alert and oriented to person, place, and time.  Psychiatric:        Mood and Affect: Mood normal.        Thought Content: Thought content normal.        Judgment: Judgment normal.   UC Treatments / Results  Labs (all labs ordered are listed, but only abnormal results are displayed) Labs Reviewed  HIV ANTIBODY (ROUTINE TESTING W REFLEX)  RPR  CERVICOVAGINAL ANCILLARY ONLY    EKG   Radiology No results found.  Procedures Procedures (including critical care time)  Medications Ordered in UC Medications - No  data to display  Initial Impression  / Assessment and Plan / UC Course  I have reviewed the triage vital signs and the nursing notes.  Pertinent labs & imaging results that were available during my care of the patient were reviewed by me and considered in my medical decision making (see chart for details).     Vaginal swab, HIV and syphilis labs all pending.  Discussed boric acid and good vaginal hygiene while awaiting results.  Treat based on results.  Final Clinical Impressions(s) / UC Diagnoses   Final diagnoses:  Routine screening for STI (sexually transmitted infection)  Vaginal discharge   Discharge Instructions   None    ED Prescriptions   None    PDMP not reviewed this encounter.   Roosvelt Maser [place], [place] 06/20/21 [phone]

## 2021-06-20 NOTE — ED Triage Notes (Signed)
Patient presents to Urgent Care with complaints of lower abdominal pain, and vaginal discharge since last week. Patient reports she is having unprotected sex.

## 2021-06-21 LAB — CERVICOVAGINAL ANCILLARY ONLY
Bacterial Vaginitis (gardnerella): POSITIVE — AB
Candida Glabrata: NEGATIVE
Candida Vaginitis: NEGATIVE
Chlamydia: NEGATIVE
Comment: NEGATIVE
Comment: NEGATIVE
Comment: NEGATIVE
Comment: NEGATIVE
Comment: NEGATIVE
Comment: NORMAL
Neisseria Gonorrhea: NEGATIVE
Trichomonas: NEGATIVE

## 2021-06-21 LAB — RPR: RPR Ser Ql: NONREACTIVE

## 2021-06-21 LAB — HIV ANTIBODY (ROUTINE TESTING W REFLEX): HIV Screen 4th Generation wRfx: NONREACTIVE

## 2021-06-22 ENCOUNTER — Telehealth (HOSPITAL_COMMUNITY): Payer: Self-pay | Admitting: Emergency Medicine

## 2021-06-22 MED ORDER — METRONIDAZOLE 500 MG PO TABS: 500.0000 mg | ORAL_TABLET | Freq: Two times a day (BID) | ORAL | 0 refills | Status: DC

## 2021-06-28 ENCOUNTER — Encounter (HOSPITAL_COMMUNITY): Payer: Self-pay

## 2021-06-28 ENCOUNTER — Emergency Department (HOSPITAL_COMMUNITY): Payer: Medicaid Other

## 2021-06-28 ENCOUNTER — Emergency Department (HOSPITAL_COMMUNITY)
Admission: EM | Admit: 2021-06-28 | Discharge: 2021-06-28 | Disposition: A | Discharge status: Home / Self Care | Payer: Medicaid Other | Attending: Emergency Medicine | Admitting: Emergency Medicine

## 2021-06-28 ENCOUNTER — Other Ambulatory Visit: Payer: Self-pay

## 2021-06-28 DIAGNOSIS — R110 Nausea: Secondary | ICD-10-CM | POA: Insufficient documentation

## 2021-06-28 DIAGNOSIS — R1030 Lower abdominal pain, unspecified: Secondary | ICD-10-CM | POA: Insufficient documentation

## 2021-06-28 DIAGNOSIS — R102 Pelvic and perineal pain: Secondary | ICD-10-CM | POA: Diagnosis not present

## 2021-06-28 LAB — URINALYSIS, ROUTINE W REFLEX MICROSCOPIC
Bilirubin Urine: NEGATIVE
Glucose, UA: NEGATIVE mg/dL
Hgb urine dipstick: NEGATIVE
Ketones, ur: NEGATIVE mg/dL
Nitrite: NEGATIVE
Protein, ur: NEGATIVE mg/dL
Specific Gravity, Urine: 1.019 (ref 1.005–1.030)
pH: 6 (ref 5.0–8.0)

## 2021-06-28 LAB — COMPREHENSIVE METABOLIC PANEL
ALT: 21 U/L (ref 0–44)
AST: 23 U/L (ref 15–41)
Albumin: 3.6 g/dL (ref 3.5–5.0)
Alkaline Phosphatase: 64 U/L (ref 38–126)
Anion gap: 6 (ref 5–15)
BUN: 7 mg/dL (ref 6–20)
CO2: 22 mmol/L (ref 22–32)
Calcium: 9.2 mg/dL (ref 8.9–10.3)
Chloride: 109 mmol/L (ref 98–111)
Creatinine, Ser: 0.73 mg/dL (ref 0.44–1.00)
GFR, Estimated: >60 mL/min (ref >60)
Glucose, Bld: 133 mg/dL — ABNORMAL HIGH (ref 70–99)
Potassium: 3.8 mmol/L (ref 3.5–5.1)
Sodium: 137 mmol/L (ref 135–145)
Total Bilirubin: 0.6 mg/dL (ref 0.3–1.2)
Total Protein: 7.1 g/dL (ref 6.5–8.1)

## 2021-06-28 LAB — CBC
HCT: 39.5 % (ref 36.0–46.0)
Hemoglobin: 12.2 g/dL (ref 12.0–15.0)
MCH: 26.4 pg (ref 26.0–34.0)
MCHC: 30.9 g/dL (ref 30.0–36.0)
MCV: 85.5 fL (ref 80.0–100.0)
Platelets: 380 10*3/uL (ref 150–400)
RBC: 4.62 MIL/uL (ref 3.87–5.11)
RDW: 14.2 % (ref 11.5–15.5)
WBC: 7.4 10*3/uL (ref 4.0–10.5)
nRBC: 0 % (ref 0.0–0.2)

## 2021-06-28 LAB — LIPASE, BLOOD: Lipase: 30 U/L (ref 11–51)

## 2021-06-28 LAB — I-STAT BETA HCG BLOOD, ED (MC, WL, AP ONLY): I-stat hCG, quantitative: <5 m[IU]/mL (ref <5)

## 2021-06-28 MED ORDER — ONDANSETRON HCL 4 MG PO TABS
4.0000 mg | ORAL_TABLET | Freq: Once | ORAL | Status: AC
Start: 2021-06-28 — End: 2021-06-28
  Administered 2021-06-28: 4 mg via ORAL
  Filled 2021-06-28: qty 1

## 2021-06-28 MED ORDER — ONDANSETRON HCL 4 MG PO TABS: 4.0000 mg | ORAL_TABLET | Freq: Three times a day (TID) | ORAL | 0 refills | Status: DC | PRN

## 2021-06-28 NOTE — ED Provider Notes (Signed)
[facility]-EMERGENCY DEPT Provider Note   CSN: [id number] Arrival date & time: 06/28/21  1257     History  Chief Complaint  Patient presents with   Abdominal Pain   Nausea    Deborah Singleton is a 26 y.o. female.  Patient presents to the emergency department complaining of lower abdominal pain.  Patient states that 1 week ago she was tested for STIs.  Testing at that time was negative except for bacterial vaginosis.  She was prescribed metronidazole.  Over the past 2 days she has developed some suprapubic pain.  She denies any vaginal discharge.  Denies vaginal bleeding.  Denies dysuria.  Endorses suprapubic pain and nausea.  Past medical history significant for seizures and history of bilateral tubal ligation  HPI     Home Medications Prior to Admission medications   Medication Sig Start Date End Date Taking? Authorizing Provider  ondansetron (ZOFRAN) 4 MG tablet Take 1 tablet (4 mg total) by mouth every 8 (eight) hours as needed for nausea or vomiting. 06/28/21  Yes Darrick Grinder, PA-C  ibuprofen (ADVIL) 600 MG tablet Take 1 tablet (600 mg total) by mouth every 6 (six) hours. Patient not taking: Reported on 09/30/2020 08/22/20   Nugent, Odie Sera, NP  levETIRAcetam (KEPPRA) 500 MG tablet Take 1/2 tablet twice a day for 3 days, then increase to 1 tablet twice a day 01/14/20   Van Clines, MD  metroNIDAZOLE (FLAGYL) 500 MG tablet Take 1 tablet (500 mg total) by mouth 2 (two) times daily. 06/22/21   Lamptey, Britta Mccreedy, MD  nitrofurantoin, macrocrystal-monohydrate, (MACROBID) 100 MG capsule Take 1 capsule (100 mg total) by mouth 2 (two) times daily. 02/09/21   Tomi Bamberger, PA-C  oxyCODONE (OXY IR/ROXICODONE) 5 MG immediate release tablet Take 1 tablet (5 mg total) by mouth every 6 (six) hours as needed for moderate pain, severe pain or breakthrough pain. Patient not taking: No sig reported 08/22/20   Nugent, Odie Sera, NP  Prenatal Vit-Fe Fumarate-FA (PRENATAL  MULTIVITAMIN) TABS tablet Take 1 tablet by mouth daily at 12 noon. Patient not taking: Reported on 09/30/2020    [provider]  senna-docusate (SENOKOT-S) 8.6-50 MG tablet Take 2 tablets by mouth daily. Patient not taking: No sig reported 08/23/20   Nugent, Odie Sera, NP      Allergies    Acetaminophen    Review of Systems   Review of Systems  Constitutional:  Negative for fever.  Respiratory:  Negative for shortness of breath.   Cardiovascular:  Negative for chest pain.  Gastrointestinal:  Positive for abdominal pain and nausea. Negative for constipation, diarrhea and vomiting.  Genitourinary:  Negative for dysuria, vaginal bleeding and vaginal discharge.   Physical Exam Updated Vital Signs BP (!) 115/95   Pulse 85   Temp 98.4 F (36.9 C) (Oral)   Resp 16   Ht 5\' 1"  (1.549 m)   Wt 105.7 kg   LMP 05/31/2021 (Approximate)   SpO2 99%   BMI 44.02 kg/m  Physical Exam Vitals and nursing note reviewed.  Constitutional:      General: She is not in acute distress.    Appearance: She is obese.  HENT:     Head: Normocephalic and atraumatic.     Mouth/Throat:     Mouth: Mucous membranes are moist.  Eyes:     Extraocular Movements: Extraocular movements intact.  Cardiovascular:     Rate and Rhythm: Normal rate and regular rhythm.     Heart  sounds: Normal heart sounds.  Pulmonary:     Effort: Pulmonary effort is normal.     Breath sounds: Normal breath sounds.  Abdominal:     General: Abdomen is flat. Bowel sounds are normal. There is no distension.     Palpations: Abdomen is soft.     Tenderness: There is abdominal tenderness in the suprapubic area. There is no right CVA tenderness, left CVA tenderness, guarding or rebound. Negative signs include Murphy's sign and McBurney's sign.  Genitourinary:    Comments: Deferred Skin:    General: Skin is warm and dry.  Neurological:     Mental Status: She is alert.    ED Results / Procedures / Treatments   Labs (all labs  ordered are listed, but only abnormal results are displayed) Labs Reviewed  COMPREHENSIVE METABOLIC PANEL - Abnormal; Notable for the following components:      Result Value   Glucose, Bld 133 (*)    All other components within normal limits  URINALYSIS, ROUTINE W REFLEX MICROSCOPIC - Abnormal; Notable for the following components:   APPearance HAZY (*)    Leukocytes,Ua SMALL (*)    Bacteria, UA RARE (*)    All other components within normal limits  LIPASE, BLOOD  CBC  I-STAT BETA HCG BLOOD, ED ([facility], [facility], [facility] ONLY)  GC/CHLAMYDIA PROBE AMP ([place] NOT [facility]    EKG None  Radiology US PELVIC COMPLETE W TRANSVAGINAL AND TORSION R/O  Result Date: 06/28/2021 CLINICAL DATA:  A 26 year old female presents with pelvic pain EXAM: TRANSABDOMINAL AND TRANSVAGINAL ULTRASOUND OF PELVIS DOPPLER ULTRASOUND OF OVARIES TECHNIQUE: Both transabdominal and transvaginal ultrasound examinations of the pelvis were performed. Transabdominal technique was performed for global imaging of the pelvis including uterus, ovaries, adnexal regions, and pelvic cul-de-sac. It was necessary to proceed with endovaginal exam following the transabdominal exam to visualize the endometrium. Color and duplex Doppler ultrasound was utilized to evaluate blood flow to the ovaries. COMPARISON:  No recent comparison FINDINGS: Uterus Measurements: 10.7 x 4.1 x 5.4 cm = volume: 126 mL. No fibroids or other mass visualized. Endometrium Thickness: 5.8 mm.  No focal abnormality visualized. Right ovary Measurements: 2.9 x 1.9 x 2.6 cm = volume: 7.6 mL. Normal appearance/no adnexal mass. Left ovary Measurements: 2.6 x 1.8 x 3.1 cm = volume: 7.6 mL. Normal appearance/no adnexal mass. Pulsed Doppler evaluation of both ovaries demonstrates normal low-resistance arterial and venous waveforms. Other findings No abnormal free fluid. IMPRESSION: Normal pelvic sonogram.  No sonographic evidence of ovarian torsion Electronically Signed   By: Donzetta KohutGeoffrey   Wile M.D.   On: 06/28/2021 15:53    Procedures Procedures    Medications Ordered in ED Medications  ondansetron (ZOFRAN) tablet 4 mg (4 mg Oral Given 06/28/21 1437)    ED Course/ Medical Decision Making/ A&P                           Medical Decision Making Amount and/or Complexity of Data Reviewed Labs: ordered. Radiology: ordered.  Risk Prescription drug management.   This patient presents to the ED for concern of suprapubic pain, this involves an extensive number of treatment options, and is a complaint that carries with it a high risk of complications and morbidity.  The differential diagnosis includes ectopic pregnancy, endometritis, ovarian torsion, and others   Co morbidities that complicate the patient evaluation  Obesity, history of BV   Additional history obtained:  External records from outside source obtained and reviewed  including urgent care notes from May 22 showing results of STI testing   Lab Tests:  I Ordered, and personally interpreted labs.  The pertinent results include: I-STAT beta-hCG 3, lipase 30, CBC and CMP grossly normal, urinalysis with hazy appearance, small leukocytes, and rare bacteria   Imaging Studies ordered:  I ordered imaging studies including pelvic ultrasound complete with transvaginal and torsion rule out I independently visualized and interpreted imaging which showed normal pelvic sonogram I agree with the radiologist interpretation   Problem List / ED Course / Critical interventions / Medication management  I ordered medication including Zofran for nausea Reevaluation of the patient after these medicines showed that the patient improved I have reviewed the patients home medicines and have made adjustments as needed   Social Determinants of Health:  No primary care provider   Test / Admission - Considered:  The patient shows no signs of torsion on imaging.  Negative beta hCG, ectopic pregnancy highly unlikely.  The  patient appears comfortable at this time.  She is able to tolerate oral fluids.  Plan to discharge home with nausea medication.  Patient should follow-up with her primary care provider if this continues to be a problem  Final Clinical Impression(s) / ED Diagnoses Final diagnoses:  Lower abdominal pain  Nausea    Rx / DC Orders ED Discharge Orders          Ordered    ondansetron (ZOFRAN) 4 MG tablet  Every 8 hours PRN        06/28/21 1647              Pamala Duffel 06/28/21 1648    Lorre Nick, MD 06/29/21 1454

## 2021-06-28 NOTE — ED Triage Notes (Signed)
Patient c/o lower abdominal pain and nausea x 2 days.  Patient had similar complaints on 5/22 and reports that she was diagnosed with BV.

## 2021-06-28 NOTE — Discharge Instructions (Addendum)
You were seen today for abdominal pain.  Your pelvic ultrasound was normal.  I have prescribed Zofran for nausea to be taken as needed.  As discussed, I recommend follow-up with primary care if possible for further evaluation.  You may check MyChart for results of the STI testing

## 2021-06-29 ENCOUNTER — Telehealth: Payer: Self-pay

## 2021-06-29 LAB — GC/CHLAMYDIA PROBE AMP (~~LOC~~) NOT AT ARMC
Chlamydia: NEGATIVE
Comment: NEGATIVE
Comment: NORMAL
Neisseria Gonorrhea: NEGATIVE

## 2021-06-29 NOTE — Telephone Encounter (Signed)
Transition Care Management Follow-up Telephone Call Date of discharge and from where: 06/28/2021 from [facility] How have you been since you were released from the hospital? Patient stated that she is feeling the same. Patient did not have any questions or concerns. Patient stated that she see the [facility] for her health care needs. Patient will reach out to them for follow up evaluation.  Any questions or concerns? No  Items Reviewed: Did the pt receive and understand the discharge instructions provided? Yes  Medications obtained and verified? Yes  Other? No  Any new allergies since your discharge? No  Dietary orders reviewed? No Do you have support at home? Yes   Functional Questionnaire: (I = Independent and D = Dependent) ADLs: I  Bathing/Dressing- I  Meal Prep- I  Eating- I  Maintaining continence- I  Transferring/Ambulation- I  Managing Meds- I   Follow up appointments reviewed:  PCP Hospital f/u appt confirmed? No  Patient stated that she goes to the [facility] f/u appt confirmed? No   Are transportation arrangements needed? No  If their condition worsens, is the pt aware to call PCP or go to the Emergency Dept.? Yes Was the patient provided with contact information for the PCP's office or ED? Yes Was to pt encouraged to call back with questions or concerns? Yes

## 2021-08-29 ENCOUNTER — Ambulatory Visit
Admission: RE | Admit: 2021-08-29 | Discharge: 2021-08-29 | Disposition: A | Discharge status: Home / Self Care | Payer: Medicaid Other | Source: Ambulatory Visit | Attending: Physician Assistant | Admitting: Physician Assistant

## 2021-08-29 VITALS — HR 76 | Temp 98.0°F | Resp 17

## 2021-08-29 DIAGNOSIS — Z113 Encounter for screening for infections with a predominantly sexual mode of transmission: Secondary | ICD-10-CM | POA: Diagnosis not present

## 2021-08-29 DIAGNOSIS — R102 Pelvic and perineal pain: Secondary | ICD-10-CM | POA: Diagnosis not present

## 2021-08-29 LAB — POCT URINALYSIS DIP (MANUAL ENTRY)
Bilirubin, UA: NEGATIVE
Blood, UA: NEGATIVE
Glucose, UA: NEGATIVE mg/dL
Ketones, POC UA: NEGATIVE mg/dL
Nitrite, UA: NEGATIVE
Protein Ur, POC: 100 mg/dL — AB
Spec Grav, UA: 1.025 (ref 1.010–1.025)
Urobilinogen, UA: 0.2 E.U./dL
pH, UA: 7 (ref 5.0–8.0)

## 2021-08-29 LAB — POCT URINE PREGNANCY: Preg Test, Ur: NEGATIVE

## 2021-08-29 NOTE — ED Triage Notes (Signed)
Pt presents with lower pelvic pain for past few days.

## 2021-08-29 NOTE — ED Provider Notes (Signed)
[facility]    CSN: [id number] Arrival date & time: 08/29/21  4098      History   Chief Complaint Chief Complaint  Patient presents with   APPOINTMENT: Pelvic Pain    HPI Deborah Singleton is a 26 y.o. female.   Patient here today for STD screening. She reports that she has not had any vaginal discharge but has had some suprapubic pain. She denies any dysuria and has not had any vomiting. She reports some intermittent nausea and admits to back pain but this preceded other symptoms and she is not sure if it is related. She denies fever. She does not report any treatment for symptoms.   The history is provided by the patient.    Past Medical History:  Diagnosis Date   Miscarriage 06/02/2019   Seizures (HCC)    last seizure Aug 2021    Patient Active Problem List   Diagnosis Date Noted   Gestational hypertension, third trimester 08/19/2020   Supervision of other normal pregnancy, antepartum 02/26/2020   Seizure disorder in pregnancy, antepartum (HCC) 02/26/2020   Obesity during pregnancy, antepartum 02/26/2020   Generalized idiopathic epilepsy and epileptic syndromes, not intractable, without status epilepticus (HCC) 04/24/2018   Maternal varicella, non-immune 04/04/2016    Past Surgical History:  Procedure Laterality Date   TOOTH EXTRACTION     TUBAL LIGATION Bilateral 08/21/2020   Procedure: POST PARTUM TUBAL LIGATION;  Surgeon: Hermina Staggers, MD;  Location: [facility] LD ORS;  Service: Gynecology;  Laterality: Bilateral;    OB History     Gravida  4   Para  3   Term  2   Preterm  1   AB  1   Living  3      SAB      IAB  1   Ectopic      Multiple  0   Live Births  3            Home Medications    Prior to Admission medications   Medication Sig Start Date End Date Taking? Authorizing Provider  ibuprofen (ADVIL) 600 MG tablet Take 1 tablet (600 mg total) by mouth every 6 (six) hours. Patient not taking: Reported on 09/30/2020 08/22/20    Nugent, Odie Sera, NP  levETIRAcetam (KEPPRA) 500 MG tablet Take 1/2 tablet twice a day for 3 days, then increase to 1 tablet twice a day 01/14/20   Van Clines, MD  metroNIDAZOLE (FLAGYL) 500 MG tablet Take 1 tablet (500 mg total) by mouth 2 (two) times daily. 06/22/21   Lamptey, Britta Mccreedy, MD  nitrofurantoin, macrocrystal-monohydrate, (MACROBID) 100 MG capsule Take 1 capsule (100 mg total) by mouth 2 (two) times daily. 02/09/21   Tomi Bamberger, PA-C  ondansetron (ZOFRAN) 4 MG tablet Take 1 tablet (4 mg total) by mouth every 8 (eight) hours as needed for nausea or vomiting. 06/28/21   Barrie Dunker B, PA-C  oxyCODONE (OXY IR/ROXICODONE) 5 MG immediate release tablet Take 1 tablet (5 mg total) by mouth every 6 (six) hours as needed for moderate pain, severe pain or breakthrough pain. Patient not taking: No sig reported 08/22/20   Nugent, Odie Sera, NP  Prenatal Vit-Fe Fumarate-FA (PRENATAL MULTIVITAMIN) TABS tablet Take 1 tablet by mouth daily at 12 noon. Patient not taking: Reported on 09/30/2020    [provider]  senna-docusate (SENOKOT-S) 8.6-50 MG tablet Take 2 tablets by mouth daily. Patient not taking: No sig reported 08/23/20   Nugent, Odie Sera,  NP    Family History Family History  Problem Relation Age of Onset   Asthma Mother    Seizures Mother    Diabetes Father    Depression Sister    Diabetes Sister        younger sister   Cancer Maternal Aunt        on dad's side   Cancer Maternal Uncle        on dad's side    Social History Social History   Tobacco Use   Smoking status: Never   Smokeless tobacco: Never  Vaping Use   Vaping Use: Never used  Substance Use Topics   Alcohol use: Not Currently   Drug use: No     Allergies   Acetaminophen   Review of Systems Review of Systems  Constitutional:  Negative for chills and fever.  Eyes:  Negative for discharge and redness.  Gastrointestinal:  Positive for abdominal pain and nausea. Negative for vomiting.   Genitourinary:  Negative for dysuria and flank pain.  Musculoskeletal:  Positive for back pain.     Physical Exam Triage Vital Signs ED Triage Vitals  Enc Vitals Group     BP      Pulse      Resp      Temp      Temp src      SpO2      Weight      Height      Head Circumference      Peak Flow      Pain Score      Pain Loc      Pain Edu?      Excl. in GC?    No data found.  Updated Vital Signs Pulse 76   Temp 98 F (36.7 C) (Oral)   Resp 17   LMP 08/20/2021   SpO2 96%      Physical Exam Vitals and nursing note reviewed.  Constitutional:      General: She is not in acute distress.    Appearance: Normal appearance. She is not ill-appearing.  HENT:     Head: Normocephalic and atraumatic.  Eyes:     Conjunctiva/sclera: Conjunctivae normal.  Cardiovascular:     Rate and Rhythm: Normal rate.  Pulmonary:     Effort: Pulmonary effort is normal.  Neurological:     Mental Status: She is alert.  Psychiatric:        Mood and Affect: Mood normal.        Behavior: Behavior normal.        Thought Content: Thought content normal.      UC Treatments / Results  Labs (all labs ordered are listed, but only abnormal results are displayed) Labs Reviewed  POCT URINALYSIS DIP (MANUAL ENTRY) - Abnormal; Notable for the following components:      Result Value   Clarity, UA cloudy (*)    Protein Ur, POC =100 (*)    Leukocytes, UA Small (1+) (*)    All other components within normal limits  URINE CULTURE  POCT URINE PREGNANCY  CERVICOVAGINAL ANCILLARY ONLY    EKG   Radiology No results found.  Procedures Procedures (including critical care time)  Medications Ordered in UC Medications - No data to display  Initial Impression / Assessment and Plan / UC Course  I have reviewed the triage vital signs and the nursing notes.  Pertinent labs & imaging results that were available during my care of the patient were reviewed by  me and considered in my medical  decision making (see chart for details).    UA with only small LE, no other signs or symptoms of UTI. Will order urine culture as well as STD screening. Will await results for further recommendation but encouraged follow up if symptoms worsen in any way.   Final Clinical Impressions(s) / UC Diagnoses   Final diagnoses:  Screening for STD (sexually transmitted disease)  Suprapubic pain   Discharge Instructions   None    ED Prescriptions   None    PDMP not reviewed this encounter.   Tomi Bamberger, PA-C 08/29/21 [phone]

## 2021-08-30 ENCOUNTER — Telehealth (HOSPITAL_COMMUNITY): Payer: Self-pay | Admitting: Emergency Medicine

## 2021-08-30 LAB — CERVICOVAGINAL ANCILLARY ONLY
Bacterial Vaginitis (gardnerella): POSITIVE — AB
Candida Glabrata: NEGATIVE
Candida Vaginitis: NEGATIVE
Chlamydia: NEGATIVE
Comment: NEGATIVE
Comment: NEGATIVE
Comment: NEGATIVE
Comment: NEGATIVE
Comment: NEGATIVE
Comment: NORMAL
Neisseria Gonorrhea: NEGATIVE
Trichomonas: NEGATIVE

## 2021-08-30 LAB — URINE CULTURE

## 2021-08-30 MED ORDER — METRONIDAZOLE 500 MG PO TABS: 500.0000 mg | ORAL_TABLET | Freq: Two times a day (BID) | ORAL | 0 refills | Status: DC

## 2021-09-19 ENCOUNTER — Ambulatory Visit
Admission: RE | Admit: 2021-09-19 | Discharge: 2021-09-19 | Disposition: A | Discharge status: Home / Self Care | Payer: Medicaid Other | Source: Ambulatory Visit | Attending: Internal Medicine | Admitting: Internal Medicine

## 2021-09-19 VITALS — BP 114/76 | HR 72 | Temp 98.2°F | Resp 18

## 2021-09-19 DIAGNOSIS — R197 Diarrhea, unspecified: Secondary | ICD-10-CM | POA: Diagnosis not present

## 2021-09-19 DIAGNOSIS — R112 Nausea with vomiting, unspecified: Secondary | ICD-10-CM

## 2021-09-19 DIAGNOSIS — A084 Viral intestinal infection, unspecified: Secondary | ICD-10-CM

## 2021-09-19 MED ORDER — ONDANSETRON 4 MG PO TBDP
4.0000 mg | ORAL_TABLET | Freq: Once | ORAL | Status: AC
  Administered 2021-09-19: 4 mg via ORAL

## 2021-09-19 MED ORDER — ONDANSETRON 4 MG PO TBDP: 4.0000 mg | ORAL_TABLET | Freq: Three times a day (TID) | ORAL | 0 refills | Status: DC | PRN

## 2021-09-19 NOTE — Discharge Instructions (Addendum)
It appears that you may have a viral illness.  You have been prescribed nausea medication to take as needed.  Please increase clear oral fluid intake with either Gatorade, water, Pedialyte.  Please go to the hospital if symptoms persist or worsen.

## 2021-09-19 NOTE — ED Provider Notes (Signed)
[facility]    CSN: [id number] Arrival date & time: 09/19/21  1152      History   Chief Complaint Chief Complaint  Patient presents with   Diarrhea    II've been nauseous & having a lot of diarrhea. I think I caught A stomach virus from my job. 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 But i just Wanted to be sure, because i cannot Go to work feeling like this. - Entered by patient   Nausea    HPI Deborah Singleton is a 26 y.o. female.   Patient presents with nausea, vomiting, diarrhea that started about 3 days ago.  Denies blood in stool or emesis.  Patient reports mild abdominal cramping only when she has to go to the bathroom.  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  Patient reports that she has been able to keep water down but not any foods.  Denies any associated fever or upper respiratory symptoms.   Diarrhea   Past Medical History:  Diagnosis Date   Miscarriage 06/02/2019   Seizures (HCC)    last seizure Aug 2021    Patient Active Problem List   Diagnosis Date Noted   Gestational hypertension, third trimester 08/19/2020   Supervision of other normal pregnancy, antepartum 02/26/2020   Seizure disorder in pregnancy, antepartum (HCC) 02/26/2020   Obesity during pregnancy, antepartum 02/26/2020   Generalized idiopathic epilepsy and epileptic syndromes, not intractable, without status epilepticus (HCC) 04/24/2018   Maternal varicella, non-immune 04/04/2016    Past Surgical History:  Procedure Laterality Date   TOOTH EXTRACTION     TUBAL LIGATION Bilateral 08/21/2020   Procedure: POST PARTUM TUBAL LIGATION;  Surgeon: Hermina Staggers, MD;  Location: [facility] LD [facility];  Service: Gynecology;  Laterality: Bilateral;    OB History     Gravida  4   Para  3   Term  2   Preterm  1   AB  1   Living  3      SAB      IAB  1   Ectopic      Multiple  0   Live  Births  3            Home Medications    Prior to Admission medications   Medication Sig Start Date End Date Taking? Authorizing Provider  ondansetron (ZOFRAN-ODT) 4 MG disintegrating tablet Take 1 tablet (4 mg total) by mouth every 8 (eight) hours as needed for vomiting or nausea. 09/19/21  Yes Sundeep Destin, Rolly Salter E, FNP  ibuprofen (ADVIL) 600 MG tablet Take 1 tablet (600 mg total) by mouth every 6 (six) hours. Patient not taking: Reported on 09/30/2020 08/22/20   Nugent, Odie Sera, NP  levETIRAcetam (KEPPRA) 500 MG tablet Take 1/2 tablet twice a day for 3 days, then increase to 1 tablet twice a day 01/14/20   Van Clines, MD  metroNIDAZOLE (FLAGYL) 500 MG tablet Take 1 tablet (500 mg total) by mouth 2 (two) times daily. 08/30/21   Lamptey, Britta Mccreedy, MD  nitrofurantoin, macrocrystal-monohydrate, (MACROBID) 100 MG capsule Take 1 capsule (100 mg total) by mouth 2 (two) times daily. 02/09/21   Tomi Bamberger, PA-C  oxyCODONE (OXY IR/ROXICODONE) 5 MG immediate release tablet Take 1 tablet (5 mg total) by mouth every 6 (six) hours as needed for moderate pain, severe pain or breakthrough pain. Patient not  taking: No sig reported 08/22/20   Nugent, Odie Sera, NP  Prenatal Vit-Fe Fumarate-FA (PRENATAL MULTIVITAMIN) TABS tablet Take 1 tablet by mouth daily at 12 noon. Patient not taking: Reported on 09/30/2020    [provider]  senna-docusate (SENOKOT-S) 8.6-50 MG tablet Take 2 tablets by mouth daily. Patient not taking: No sig reported 08/23/20   Nugent, Odie Sera, NP    Family History Family History  Problem Relation Age of Onset   Asthma Mother    Seizures Mother    Diabetes Father    Depression Sister    Diabetes Sister        younger sister   Cancer Maternal Aunt        on dad's side   Cancer Maternal Uncle        on dad's side    Social History Social History   Tobacco Use   Smoking status: Never   Smokeless tobacco: Never  Vaping Use   Vaping Use: Never used  Substance Use  Topics   Alcohol use: Not Currently   Drug use: No     Allergies   Acetaminophen   Review of Systems Review of Systems Per HPI  Physical Exam Triage Vital Signs ED Triage Vitals  Enc Vitals Group     BP 09/19/21 1157 114/76     Pulse Rate 09/19/21 1157 72     Resp 09/19/21 1157 18     Temp 09/19/21 1157 98.2 F (36.8 C)     Temp src --      SpO2 09/19/21 1157 98 %     Weight --      Height --      Head Circumference --      Peak Flow --      Pain Score 09/19/21 1200 0     Pain Loc --      Pain Edu? --      Excl. in GC? --    No data found.  Updated Vital Signs BP 114/76   Pulse 72   Temp 98.2 F (36.8 C)   Resp 18   LMP 08/20/2021   SpO2 98%   Visual Acuity Right Eye Distance:   Left Eye Distance:   Bilateral Distance:    Right Eye Near:   Left Eye Near:    Bilateral Near:     Physical Exam Constitutional:      General: She is not in acute distress.    Appearance: Normal appearance. She is not toxic-appearing or diaphoretic.  HENT:     Head: Normocephalic and atraumatic.     Mouth/Throat:     Mouth: Mucous membranes are moist.     Pharynx: No posterior oropharyngeal erythema.  Eyes:     Extraocular Movements: Extraocular movements intact.     Conjunctiva/sclera: Conjunctivae normal.  Cardiovascular:     Rate and Rhythm: Normal rate and regular rhythm.     Pulses: Normal pulses.     Heart sounds: Normal heart sounds.  Pulmonary:     Effort: Pulmonary effort is normal. No respiratory distress.     Breath sounds: Normal breath sounds.  Abdominal:     General: Bowel sounds are normal. There is no distension.     Palpations: Abdomen is soft.     Tenderness: There is no abdominal tenderness.  Neurological:     General: No focal deficit present.     Mental Status: She is alert and oriented to person, place, and time. Mental status is at  baseline.  Psychiatric:        Mood and Affect: Mood normal.        Behavior: Behavior normal.         Thought Content: Thought content normal.        Judgment: Judgment normal.      UC Treatments / Results  Labs (all labs ordered are listed, but only abnormal results are displayed) Labs Reviewed - No data to display  EKG   Radiology No results found.  Procedures Procedures (including critical care time)  Medications Ordered in UC Medications  ondansetron (ZOFRAN-ODT) disintegrating tablet 4 mg (4 mg Oral Given 09/19/21 1220)    Initial Impression / Assessment and Plan / UC Course  I have reviewed the triage vital signs and the nursing notes.  Pertinent labs & imaging results that were available during my care of the patient were reviewed by me and considered in my medical decision making (see chart for details).     Differential diagnoses include viral gastroenteritis versus food related illness.  Highly suspicious of viral stomach virus given patient's close exposure.  Will treat with ondansetron.  Advised patient to ensure adequate fluid hydration.  No current signs of dehydration on exam.  Patient was given strict return and ER precautions.  Patient verbalized understanding and was agreeable with plan. Final Clinical Impressions(s) / UC Diagnoses   Final diagnoses:  Viral gastroenteritis  Nausea vomiting and diarrhea     Discharge Instructions      It appears that you may have a viral illness.  You have been prescribed nausea medication to take as needed.  Please increase clear oral fluid intake with either Gatorade, water, Pedialyte.  Please go to the hospital if symptoms persist or worsen.    ED Prescriptions     Medication Sig Dispense Auth. Provider   ondansetron (ZOFRAN-ODT) 4 MG disintegrating tablet Take 1 tablet (4 mg total) by mouth every 8 (eight) hours as needed for vomiting or nausea. 20 tablet [place], Michele Rockers, [place]      PDMP not reviewed this encounter.   Teodora Medici, [place] 09/19/21 1259

## 2021-09-19 NOTE — ED Triage Notes (Signed)
Pt presents to uc with co of nausea/ vomiting/ diarrhea for 3 days. 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 Pt reports unable to keep food down just water.

## 2021-11-05 ENCOUNTER — Ambulatory Visit
Admission: EM | Admit: 2021-11-05 | Discharge: 2021-11-05 | Disposition: A | Discharge status: Home / Self Care | Payer: Medicaid Other | Attending: Internal Medicine | Admitting: Internal Medicine

## 2021-11-05 DIAGNOSIS — R1030 Lower abdominal pain, unspecified: Secondary | ICD-10-CM | POA: Insufficient documentation

## 2021-11-05 DIAGNOSIS — Z3202 Encounter for pregnancy test, result negative: Secondary | ICD-10-CM | POA: Diagnosis not present

## 2021-11-05 DIAGNOSIS — Z113 Encounter for screening for infections with a predominantly sexual mode of transmission: Secondary | ICD-10-CM

## 2021-11-05 LAB — POCT URINALYSIS DIP (MANUAL ENTRY)
Bilirubin, UA: NEGATIVE
Glucose, UA: NEGATIVE mg/dL
Ketones, POC UA: NEGATIVE mg/dL
Nitrite, UA: NEGATIVE
Protein Ur, POC: NEGATIVE mg/dL
Spec Grav, UA: >=1.03 — AB (ref 1.010–1.025)
Urobilinogen, UA: 0.2 E.U./dL
pH, UA: 6 (ref 5.0–8.0)

## 2021-11-05 LAB — POCT URINE PREGNANCY: Preg Test, Ur: NEGATIVE

## 2021-11-05 NOTE — Discharge Instructions (Addendum)
Your urine pregnancy test was negative.  Your urine did not show any obvious signs of infection or abnormalities.  Urine culture, vaginal swab, blood work are pending.  We will call if they are abnormal.  Please follow-up with primary care doctor for further evaluation and management.  Go to the hospital if symptoms persist or worsen.

## 2021-11-05 NOTE — ED Provider Notes (Signed)
[facility]    CSN: [id number] Arrival date & time: 11/05/21  0915      History   Chief Complaint Chief Complaint  Patient presents with   Abdominal Pain    HPI Deborah Singleton is a 26 y.o. female.   Patient presents with lower abdominal pain that has been present for about 2 weeks.  Patient reports that abdominal pain is cramping in nature and is intermittent.  She states that she does not currently have any abdominal pain but it occurred prior to arrival to urgent care and is rated 10/10 on pain scale when it occurs.  She reports that she mainly notices that the pain occurs while she is eating.  Denies nausea, vomiting, diarrhea.  Patient having normal bowel movements and denies any recent blood in stool.  Denies dysuria, urinary frequency, vaginal discharge, vaginal pain, pelvic pain, abnormal vaginal bleeding, back pain, fever.  Patient reports that she is currently on her menstrual cycle and had a tubal ligation approximately July 2022. Menstrual cycle was later than usual per patient. Patient reports that she has had recent unprotected sexual intercourse as well.  Denies any confirmed exposure to STD.   Abdominal Pain   Past Medical History:  Diagnosis Date   Miscarriage 06/02/2019   Seizures (HCC)    last seizure Aug 2021    Patient Active Problem List   Diagnosis Date Noted   Gestational hypertension, third trimester 08/19/2020   Supervision of other normal pregnancy, antepartum 02/26/2020   Seizure disorder in pregnancy, antepartum (HCC) 02/26/2020   Obesity during pregnancy, antepartum 02/26/2020   Generalized idiopathic epilepsy and epileptic syndromes, not intractable, without status epilepticus (HCC) 04/24/2018   Maternal varicella, non-immune 04/04/2016    Past Surgical History:  Procedure Laterality Date   TOOTH EXTRACTION     TUBAL LIGATION Bilateral 08/21/2020   Procedure: POST PARTUM TUBAL LIGATION;  Surgeon: Hermina Staggers, MD;  Location:  [facility] LD ORS;  Service: Gynecology;  Laterality: Bilateral;    OB History     Gravida  4   Para  3   Term  2   Preterm  1   AB  1   Living  3      SAB      IAB  1   Ectopic      Multiple  0   Live Births  3            Home Medications    Prior to Admission medications   Medication Sig Start Date End Date Taking? Authorizing Provider  ibuprofen (ADVIL) 600 MG tablet Take 1 tablet (600 mg total) by mouth every 6 (six) hours. Patient not taking: Reported on 09/30/2020 08/22/20   Nugent, Odie Sera, NP  levETIRAcetam (KEPPRA) 500 MG tablet Take 1/2 tablet twice a day for 3 days, then increase to 1 tablet twice a day 01/14/20   Van Clines, MD  metroNIDAZOLE (FLAGYL) 500 MG tablet Take 1 tablet (500 mg total) by mouth 2 (two) times daily. 08/30/21   Lamptey, Britta Mccreedy, MD  nitrofurantoin, macrocrystal-monohydrate, (MACROBID) 100 MG capsule Take 1 capsule (100 mg total) by mouth 2 (two) times daily. 02/09/21   Tomi Bamberger, PA-C  ondansetron (ZOFRAN-ODT) 4 MG disintegrating tablet Take 1 tablet (4 mg total) by mouth every 8 (eight) hours as needed for vomiting or nausea. 09/19/21   Gustavus Bryant, FNP  oxyCODONE (OXY IR/ROXICODONE) 5 MG immediate release tablet Take 1 tablet (5 mg total) by  mouth every 6 (six) hours as needed for moderate pain, severe pain or breakthrough pain. Patient not taking: No sig reported 08/22/20   Nugent, Odie Sera, NP  Prenatal Vit-Fe Fumarate-FA (PRENATAL MULTIVITAMIN) TABS tablet Take 1 tablet by mouth daily at 12 noon. Patient not taking: Reported on 09/30/2020    [provider]  senna-docusate (SENOKOT-S) 8.6-50 MG tablet Take 2 tablets by mouth daily. Patient not taking: No sig reported 08/23/20   Nugent, Odie Sera, NP    Family History Family History  Problem Relation Age of Onset   Asthma Mother    Seizures Mother    Diabetes Father    Depression Sister    Diabetes Sister        younger sister   Cancer Maternal Aunt        on  dad's side   Cancer Maternal Uncle        on dad's side    Social History Social History   Tobacco Use   Smoking status: Never   Smokeless tobacco: Never  Vaping Use   Vaping Use: Never used  Substance Use Topics   Alcohol use: Not Currently   Drug use: No     Allergies   Acetaminophen   Review of Systems Review of Systems Per HPI  Physical Exam Triage Vital Signs ED Triage Vitals [11/05/21 0939]  Enc Vitals Group     BP 118/78     Pulse Rate 77     Resp 16     Temp (!) 97.5 F (36.4 C)     Temp Source Oral     SpO2 98 %     Weight      Height      Head Circumference      Peak Flow      Pain Score 7     Pain Loc      Pain Edu?      Excl. in GC?    No data found.  Updated Vital Signs BP 118/78 (BP Location: Right Arm)   Pulse 77   Temp (!) 97.5 F (36.4 C) (Oral)   Resp 16   SpO2 98%   Breastfeeding No   Visual Acuity Right Eye Distance:   Left Eye Distance:   Bilateral Distance:    Right Eye Near:   Left Eye Near:    Bilateral Near:     Physical Exam Constitutional:      General: She is not in acute distress.    Appearance: Normal appearance. She is not toxic-appearing or diaphoretic.  HENT:     Head: Normocephalic and atraumatic.     Mouth/Throat:     Mouth: Mucous membranes are moist.     Pharynx: No posterior oropharyngeal erythema.  Eyes:     Extraocular Movements: Extraocular movements intact.     Conjunctiva/sclera: Conjunctivae normal.  Cardiovascular:     Rate and Rhythm: Normal rate and regular rhythm.     Pulses: Normal pulses.     Heart sounds: Normal heart sounds.  Pulmonary:     Effort: Pulmonary effort is normal. No respiratory distress.     Breath sounds: Normal breath sounds.  Abdominal:     General: Bowel sounds are normal. There is no distension.     Palpations: Abdomen is soft.     Tenderness: There is abdominal tenderness in the suprapubic area.     Comments: Very mild tenderness to palpation to suprapubic  abdomen.   Neurological:     General:  No focal deficit present.     Mental Status: She is alert and oriented to person, place, and time. Mental status is at baseline.  Psychiatric:        Mood and Affect: Mood normal.        Behavior: Behavior normal.        Thought Content: Thought content normal.        Judgment: Judgment normal.      UC Treatments / Results  Labs (all labs ordered are listed, but only abnormal results are displayed) Labs Reviewed  POCT URINALYSIS DIP (MANUAL ENTRY) - Abnormal; Notable for the following components:      Result Value   Clarity, UA hazy (*)    Spec Grav, UA >=1.030 (*)    Blood, UA large (*)    Leukocytes, UA Trace (*)    All other components within normal limits  URINE CULTURE  CBC  COMPREHENSIVE METABOLIC PANEL  POCT URINE PREGNANCY  CERVICOVAGINAL ANCILLARY ONLY    EKG   Radiology No results found.  Procedures Procedures (including critical care time)  Medications Ordered in UC Medications - No data to display  Initial Impression / Assessment and Plan / UC Course  I have reviewed the triage vital signs and the nursing notes.  Pertinent labs & imaging results that were available during my care of the patient were reviewed by me and considered in my medical decision making (see chart for details).     Urine pregnancy test was completed to ensure the tubal location had not failed.  It was negative.  Urinalysis showing trace amount of leukocytes which is not definitive for infection so will send urine culture to confirm.  Cervicovaginal swab pending to rule out vaginitis as cause of patient's lower abdominal pain. CMP and CBC also pending.  Differential diagnoses include acute cystitis versus IBS versus gastroenteritis.  Patient was advised to go to the emergency department if symptoms persist or worsen.  No signs of acute abdomen on exam so do not think that emergent evaluation or need for imaging of the abdomen is necessary.   Patient encouraged to follow-up with PCP for further evaluation and management and PCP appointment made for patient prior to discharge.  Patient verbalized understanding and was agreeable with plan. Final Clinical Impressions(s) / UC Diagnoses   Final diagnoses:  Lower abdominal pain  Urine pregnancy test negative  Screening examination for venereal disease     Discharge Instructions      Your urine pregnancy test was negative.  Your urine did not show any obvious signs of infection or abnormalities.  Urine culture, vaginal swab, blood work are pending.  We will call if they are abnormal.  Please follow-up with primary care doctor for further evaluation and management.  Go to the hospital if symptoms persist or worsen.    ED Prescriptions   None    PDMP not reviewed this encounter.   Teodora Medici, [place] 11/05/21 1027

## 2021-11-05 NOTE — ED Triage Notes (Signed)
Pt c/o abd x 2 weeks states "I can't finish all of my food" b/c of the pain.

## 2021-11-06 LAB — COMPREHENSIVE METABOLIC PANEL
ALT: 18 IU/L (ref 0–32)
AST: 16 IU/L (ref 0–40)
Albumin/Globulin Ratio: 1.5 (ref 1.2–2.2)
Albumin: 4 g/dL (ref 4.0–5.0)
Alkaline Phosphatase: 80 IU/L (ref 44–121)
BUN/Creatinine Ratio: 8 — ABNORMAL LOW (ref 9–23)
BUN: 7 mg/dL (ref 6–20)
Bilirubin Total: 0.2 mg/dL (ref 0.0–1.2)
CO2: 19 mmol/L — ABNORMAL LOW (ref 20–29)
Calcium: 9.2 mg/dL (ref 8.7–10.2)
Chloride: 106 mmol/L (ref 96–106)
Creatinine, Ser: 0.88 mg/dL (ref 0.57–1.00)
Globulin, Total: 2.7 g/dL (ref 1.5–4.5)
Glucose: 118 mg/dL — ABNORMAL HIGH (ref 70–99)
Potassium: 4.3 mmol/L (ref 3.5–5.2)
Sodium: 141 mmol/L (ref 134–144)
Total Protein: 6.7 g/dL (ref 6.0–8.5)
eGFR: 93 mL/min/{1.73_m2} (ref >59)

## 2021-11-06 LAB — CBC
Hematocrit: 37.8 % (ref 34.0–46.6)
Hemoglobin: 12.1 g/dL (ref 11.1–15.9)
MCH: 27.1 pg (ref 26.6–33.0)
MCHC: 32 g/dL (ref 31.5–35.7)
MCV: 85 fL (ref 79–97)
Platelets: 347 10*3/uL (ref 150–450)
RBC: 4.47 x10E6/uL (ref 3.77–5.28)
RDW: 13.6 % (ref 11.7–15.4)
WBC: 5.7 10*3/uL (ref 3.4–10.8)

## 2021-11-06 LAB — URINE CULTURE: Culture: 10000 — AB

## 2021-11-07 ENCOUNTER — Ambulatory Visit: Payer: Medicaid Other | Admitting: Internal Medicine

## 2021-11-07 LAB — CERVICOVAGINAL ANCILLARY ONLY
Bacterial Vaginitis (gardnerella): POSITIVE — AB
Candida Glabrata: NEGATIVE
Candida Vaginitis: POSITIVE — AB
Chlamydia: NEGATIVE
Comment: NEGATIVE
Comment: NEGATIVE
Comment: NEGATIVE
Comment: NEGATIVE
Comment: NEGATIVE
Comment: NORMAL
Neisseria Gonorrhea: NEGATIVE
Trichomonas: NEGATIVE

## 2021-11-08 ENCOUNTER — Telehealth: Payer: Self-pay | Admitting: Emergency Medicine

## 2021-11-08 MED ORDER — FLUCONAZOLE 150 MG PO TABS: 150.0000 mg | ORAL_TABLET | Freq: Once | ORAL | 0 refills | Status: AC

## 2021-11-08 MED ORDER — METRONIDAZOLE 500 MG PO TABS: 500.0000 mg | ORAL_TABLET | Freq: Two times a day (BID) | ORAL | 0 refills | Status: DC

## 2021-12-29 ENCOUNTER — Other Ambulatory Visit: Payer: Self-pay

## 2021-12-29 ENCOUNTER — Emergency Department (HOSPITAL_COMMUNITY)
Admission: EM | Admit: 2021-12-29 | Discharge: 2021-12-30 | Discharge status: Against Medical Advice | Payer: Medicaid Other | Attending: Emergency Medicine | Admitting: Emergency Medicine

## 2021-12-29 ENCOUNTER — Encounter (HOSPITAL_COMMUNITY): Payer: Self-pay

## 2021-12-29 DIAGNOSIS — R531 Weakness: Secondary | ICD-10-CM | POA: Diagnosis not present

## 2021-12-29 DIAGNOSIS — R110 Nausea: Secondary | ICD-10-CM | POA: Insufficient documentation

## 2021-12-29 DIAGNOSIS — Z1152 Encounter for screening for COVID-19: Secondary | ICD-10-CM | POA: Diagnosis not present

## 2021-12-29 DIAGNOSIS — R197 Diarrhea, unspecified: Secondary | ICD-10-CM | POA: Diagnosis not present

## 2021-12-29 DIAGNOSIS — Z5321 Procedure and treatment not carried out due to patient leaving prior to being seen by health care provider: Secondary | ICD-10-CM | POA: Diagnosis not present

## 2021-12-29 DIAGNOSIS — R109 Unspecified abdominal pain: Secondary | ICD-10-CM | POA: Diagnosis not present

## 2021-12-29 LAB — URINALYSIS, ROUTINE W REFLEX MICROSCOPIC
Bilirubin Urine: NEGATIVE
Glucose, UA: NEGATIVE mg/dL
Hgb urine dipstick: NEGATIVE
Ketones, ur: 5 mg/dL — AB
Nitrite: NEGATIVE
Protein, ur: NEGATIVE mg/dL
Specific Gravity, Urine: 1.025 (ref 1.005–1.030)
pH: 5 (ref 5.0–8.0)

## 2021-12-29 LAB — CBC WITH DIFFERENTIAL/PLATELET
Abs Immature Granulocytes: 0.03 10*3/uL (ref 0.00–0.07)
Basophils Absolute: 0 10*3/uL (ref 0.0–0.1)
Basophils Relative: 0 %
Eosinophils Absolute: 0.2 10*3/uL (ref 0.0–0.5)
Eosinophils Relative: 2 %
HCT: 38.3 % (ref 36.0–46.0)
Hemoglobin: 11.9 g/dL — ABNORMAL LOW (ref 12.0–15.0)
Immature Granulocytes: 0 %
Lymphocytes Relative: 36 %
Lymphs Abs: 2.7 10*3/uL (ref 0.7–4.0)
MCH: 27.3 pg (ref 26.0–34.0)
MCHC: 31.1 g/dL (ref 30.0–36.0)
MCV: 87.8 fL (ref 80.0–100.0)
Monocytes Absolute: 0.8 10*3/uL (ref 0.1–1.0)
Monocytes Relative: 11 %
Neutro Abs: 3.8 10*3/uL (ref 1.7–7.7)
Neutrophils Relative %: 51 %
Platelets: 281 10*3/uL (ref 150–400)
RBC: 4.36 MIL/uL (ref 3.87–5.11)
RDW: 13.5 % (ref 11.5–15.5)
WBC: 7.5 10*3/uL (ref 4.0–10.5)
nRBC: 0 % (ref 0.0–0.2)

## 2021-12-29 LAB — PREGNANCY, URINE: Preg Test, Ur: NEGATIVE

## 2021-12-29 NOTE — ED Triage Notes (Signed)
Pt reports having abdominal pain for a while and is now having weakness and diarrhea today.

## 2021-12-29 NOTE — ED Provider Triage Note (Signed)
Emergency Medicine Provider Triage Evaluation Note  Deborah Singleton , a 26 y.o. female  was evaluated in triage.  Pt complains of abdominal pain for multiple weeks.  Over the last couple of days it is worsened and now she has developed diarrhea.  Has been nauseated but no vomiting.  Denies alcohol, illicit drugs, NSAIDs or recent antibiotics.  Last menstrual period 2 weeks ago.  Review of Systems  Positive:  Negative:   Physical Exam  BP 129/81 (BP Location: Left Arm)   Pulse 76   Temp 98.4 F (36.9 C) (Oral)   Resp 16   Ht 5\' 1"  (1.549 m)   Wt 101.6 kg   SpO2 100%   BMI 42.32 kg/m  Gen:   Awake, no distress   Resp:  Normal effort  MSK:   Moves extremities without difficulty  Other:  Generally tender abdomen, worst in epigastric area  Medical Decision Making  Medically screening exam initiated at 11:10 PM.  Appropriate orders placed.  Deborah Singleton was informed that the remainder of the evaluation will be completed by another provider, this initial triage assessment does not replace that evaluation, and the importance of remaining in the ED until their evaluation is complete.     , PA-C 12/29/21 2313

## 2021-12-30 LAB — COMPREHENSIVE METABOLIC PANEL
ALT: 18 U/L (ref 0–44)
AST: 18 U/L (ref 15–41)
Albumin: 3.7 g/dL (ref 3.5–5.0)
Alkaline Phosphatase: 50 U/L (ref 38–126)
Anion gap: 4 — ABNORMAL LOW (ref 5–15)
BUN: 7 mg/dL (ref 6–20)
CO2: 26 mmol/L (ref 22–32)
Calcium: 9 mg/dL (ref 8.9–10.3)
Chloride: 108 mmol/L (ref 98–111)
Creatinine, Ser: 0.77 mg/dL (ref 0.44–1.00)
GFR, Estimated: >60 mL/min (ref >60)
Glucose, Bld: 116 mg/dL — ABNORMAL HIGH (ref 70–99)
Potassium: 3.4 mmol/L — ABNORMAL LOW (ref 3.5–5.1)
Sodium: 138 mmol/L (ref 135–145)
Total Bilirubin: 0.3 mg/dL (ref 0.3–1.2)
Total Protein: 6.8 g/dL (ref 6.5–8.1)

## 2021-12-30 LAB — RESP PANEL BY RT-PCR (FLU A&B, COVID) ARPGX2
Influenza A by PCR: NEGATIVE
Influenza B by PCR: NEGATIVE
SARS Coronavirus 2 by RT PCR: NEGATIVE

## 2021-12-30 LAB — LIPASE, BLOOD: Lipase: 41 U/L (ref 11–51)

## 2022-01-08 ENCOUNTER — Encounter (HOSPITAL_COMMUNITY): Payer: Self-pay

## 2022-01-08 ENCOUNTER — Emergency Department (HOSPITAL_COMMUNITY)
Admission: EM | Admit: 2022-01-08 | Discharge: 2022-01-08 | Discharge status: Against Medical Advice | Payer: Medicaid Other | Attending: Student | Admitting: Student

## 2022-01-08 ENCOUNTER — Other Ambulatory Visit: Payer: Self-pay

## 2022-01-08 DIAGNOSIS — N939 Abnormal uterine and vaginal bleeding, unspecified: Secondary | ICD-10-CM | POA: Diagnosis not present

## 2022-01-08 DIAGNOSIS — R1084 Generalized abdominal pain: Secondary | ICD-10-CM | POA: Diagnosis present

## 2022-01-08 DIAGNOSIS — Z5321 Procedure and treatment not carried out due to patient leaving prior to being seen by health care provider: Secondary | ICD-10-CM | POA: Diagnosis not present

## 2022-01-08 LAB — COMPREHENSIVE METABOLIC PANEL
ALT: 43 U/L (ref 0–44)
AST: 31 U/L (ref 15–41)
Albumin: 3.5 g/dL (ref 3.5–5.0)
Alkaline Phosphatase: 58 U/L (ref 38–126)
Anion gap: 7 (ref 5–15)
BUN: 6 mg/dL (ref 6–20)
CO2: 24 mmol/L (ref 22–32)
Calcium: 9.2 mg/dL (ref 8.9–10.3)
Chloride: 107 mmol/L (ref 98–111)
Creatinine, Ser: 0.84 mg/dL (ref 0.44–1.00)
GFR, Estimated: >60 mL/min (ref >60)
Glucose, Bld: 107 mg/dL — ABNORMAL HIGH (ref 70–99)
Potassium: 4.1 mmol/L (ref 3.5–5.1)
Sodium: 138 mmol/L (ref 135–145)
Total Bilirubin: 0.3 mg/dL (ref 0.3–1.2)
Total Protein: 6.4 g/dL — ABNORMAL LOW (ref 6.5–8.1)

## 2022-01-08 LAB — CBC WITH DIFFERENTIAL/PLATELET
Abs Immature Granulocytes: 0.01 10*3/uL (ref 0.00–0.07)
Basophils Absolute: 0 10*3/uL (ref 0.0–0.1)
Basophils Relative: 1 %
Eosinophils Absolute: 0.2 10*3/uL (ref 0.0–0.5)
Eosinophils Relative: 3 %
HCT: 36.8 % (ref 36.0–46.0)
Hemoglobin: 11.5 g/dL — ABNORMAL LOW (ref 12.0–15.0)
Immature Granulocytes: 0 %
Lymphocytes Relative: 45 %
Lymphs Abs: 2.9 10*3/uL (ref 0.7–4.0)
MCH: 27.4 pg (ref 26.0–34.0)
MCHC: 31.3 g/dL (ref 30.0–36.0)
MCV: 87.6 fL (ref 80.0–100.0)
Monocytes Absolute: 0.9 10*3/uL (ref 0.1–1.0)
Monocytes Relative: 14 %
Neutro Abs: 2.4 10*3/uL (ref 1.7–7.7)
Neutrophils Relative %: 37 %
Platelets: 290 10*3/uL (ref 150–400)
RBC: 4.2 MIL/uL (ref 3.87–5.11)
RDW: 13.7 % (ref 11.5–15.5)
WBC: 6.5 10*3/uL (ref 4.0–10.5)
nRBC: 0 % (ref 0.0–0.2)

## 2022-01-08 LAB — LIPASE, BLOOD: Lipase: 40 U/L (ref 11–51)

## 2022-01-08 LAB — I-STAT BETA HCG BLOOD, ED (MC, WL, AP ONLY): I-stat hCG, quantitative: <5 m[IU]/mL (ref <5)

## 2022-01-08 NOTE — ED Notes (Signed)
Pt stated she was leaving AMA due to wait 

## 2022-01-08 NOTE — ED Triage Notes (Addendum)
Pt arrived complaining of vaginal bleeding and abdominal pain that goes across  her abdomen that has been ongoing for about two wks   Pt state that she is normally very regular with her periods

## 2022-01-08 NOTE — ED Provider Triage Note (Signed)
Emergency Medicine Provider Triage Evaluation Note  Deborah Singleton , a 26 y.o. female  was evaluated in triage.  Pt complains of generalized low abdominal pain and cramping.  Patient also complains of heavier than usual menstrual flow and states that is not currently time for her period.  She states that she had tubal ligation in July 2022.  The patient is sexually active.  She denies nausea, vomiting, diarrhea  Review of Systems  Positive: As above Negative: As above  Physical Exam  BP 123/75 (BP Location: Right Arm)   Pulse 92   Temp 98.5 F (36.9 C)   Resp 16   SpO2 97%  Gen:   Awake, no distress   Resp:  Normal effort  MSK:   Moves extremities without difficulty  Other:  No abdominal tenderness to palpation  Medical Decision Making  Medically screening exam initiated at 7:04 PM.  Appropriate orders placed.  Deborah Singleton was informed that the remainder of the evaluation will be completed by another provider, this initial triage assessment does not replace that evaluation, and the importance of remaining in the ED until their evaluation is complete.     Pamala Duffel 01/08/22 1905

## 2022-02-03 ENCOUNTER — Ambulatory Visit: Payer: Self-pay

## 2022-02-05 ENCOUNTER — Ambulatory Visit
Admission: EM | Admit: 2022-02-05 | Discharge: 2022-02-05 | Discharge status: Against Medical Advice | Payer: Medicaid Other

## 2022-02-05 ENCOUNTER — Encounter: Payer: Self-pay | Admitting: Emergency Medicine

## 2022-02-05 ENCOUNTER — Other Ambulatory Visit: Payer: Self-pay

## 2022-02-05 ENCOUNTER — Ambulatory Visit
Admission: EM | Admit: 2022-02-05 | Discharge: 2022-02-05 | Disposition: A | Discharge status: Home / Self Care | Payer: Medicaid Other | Attending: Nurse Practitioner | Admitting: Nurse Practitioner

## 2022-02-05 DIAGNOSIS — N760 Acute vaginitis: Secondary | ICD-10-CM | POA: Insufficient documentation

## 2022-02-05 DIAGNOSIS — R350 Frequency of micturition: Secondary | ICD-10-CM | POA: Diagnosis not present

## 2022-02-05 LAB — POCT URINALYSIS DIP (MANUAL ENTRY)
Bilirubin, UA: NEGATIVE
Glucose, UA: NEGATIVE mg/dL
Ketones, POC UA: NEGATIVE mg/dL
Leukocytes, UA: NEGATIVE
Nitrite, UA: NEGATIVE
Protein Ur, POC: 100 mg/dL — AB
Spec Grav, UA: 1.025 (ref 1.010–1.025)
Urobilinogen, UA: 1 E.U./dL
pH, UA: 7 (ref 5.0–8.0)

## 2022-02-05 LAB — POCT URINE PREGNANCY: Preg Test, Ur: NEGATIVE

## 2022-02-05 MED ORDER — METRONIDAZOLE 500 MG PO TABS: 500.0000 mg | ORAL_TABLET | Freq: Two times a day (BID) | ORAL | 0 refills | Status: AC

## 2022-02-05 NOTE — Discharge Instructions (Signed)
Flagyl twice daily for 7 days.  No alcohol while on this medication Clinical contact you if the results of your urine culture STD testing are positive Rest and fluids PCP follow-up 2 to 3 days for recheck ER for any worsening symptoms

## 2022-02-05 NOTE — ED Triage Notes (Signed)
Pt here for vaginal discharge and odor x 1 week

## 2022-02-05 NOTE — ED Provider Notes (Signed)
[facility]    CSN: [id number] Arrival date & time: 02/05/22  0943      History   Chief Complaint Chief Complaint  Patient presents with   Vaginal Discharge    HPI Deborah Singleton is a 27 y.o. female The patient is a 27 y.o. female who presents for evaluation of vaginal discharge. Pt reports symptoms began 7 days ago. Denies dysuria endorses urinary frequency. No STD exposure but she would like to be checked for STDs.  No fevers, nausea/vomiting, flank pain.  No recent antibiotic usage. Patient has a history of vaginal infections such as yeast infection or BV in the past.  Symptoms feel like BV.  Patient has used nothing OTC medications for symptoms. Pt has no other concerns at this time.    Vaginal Discharge   Past Medical History:  Diagnosis Date   Miscarriage 06/02/2019   Seizures [facility])    last seizure Aug 2021    Patient Active Problem List   Diagnosis Date Noted   Gestational hypertension, third trimester 08/19/2020   Supervision of other normal pregnancy, antepartum 02/26/2020   Seizure disorder in pregnancy, antepartum ([place]) 02/26/2020   Obesity during pregnancy, antepartum 02/26/2020   Generalized idiopathic epilepsy and epileptic syndromes, not intractable, without status epilepticus ([place]) 04/24/2018   Maternal varicella, non-immune 04/04/2016    Past Surgical History:  Procedure Laterality Date   TOOTH EXTRACTION     TUBAL LIGATION Bilateral 08/21/2020   Procedure: POST PARTUM TUBAL LIGATION;  Surgeon: Chancy Milroy, MD;  Location: [facility] LD ORS;  Service: Gynecology;  Laterality: Bilateral;    OB History     Gravida  4   Para  3   Term  2   Preterm  1   AB  1   Living  3      SAB      IAB  1   Ectopic      Multiple  0   Live Births  3            Home Medications    Prior to Admission medications   Medication Sig Start Date End Date Taking? Authorizing Provider  metroNIDAZOLE (FLAGYL) 500 MG tablet Take 1 tablet  (500 mg total) by mouth 2 (two) times daily for 7 days. 02/05/22 02/12/22 Yes Melynda Ripple, NP  ibuprofen (ADVIL) 600 MG tablet Take 1 tablet (600 mg total) by mouth every 6 (six) hours. Patient not taking: Reported on 09/30/2020 08/22/20   Nugent, Gerrie Nordmann, NP  levETIRAcetam (KEPPRA) 500 MG tablet Take 1/2 tablet twice a day for 3 days, then increase to 1 tablet twice a day 01/14/20   Cameron Sprang, MD  nitrofurantoin, macrocrystal-monohydrate, (MACROBID) 100 MG capsule Take 1 capsule (100 mg total) by mouth 2 (two) times daily. Patient not taking: Reported on 02/05/2022 02/09/21   Francene Finders, PA-C  ondansetron (ZOFRAN-ODT) 4 MG disintegrating tablet Take 1 tablet (4 mg total) by mouth every 8 (eight) hours as needed for vomiting or nausea. 09/19/21   Teodora Medici, FNP  oxyCODONE (OXY IR/ROXICODONE) 5 MG immediate release tablet Take 1 tablet (5 mg total) by mouth every 6 (six) hours as needed for moderate pain, severe pain or breakthrough pain. Patient not taking: No sig reported 08/22/20   Nugent, Gerrie Nordmann, NP  Prenatal Vit-Fe Fumarate-FA (PRENATAL MULTIVITAMIN) TABS tablet Take 1 tablet by mouth daily at 12 noon. Patient not taking: Reported on 09/30/2020    [provider]  senna-docusate (  SENOKOT-S) 8.6-50 MG tablet Take 2 tablets by mouth daily. Patient not taking: No sig reported 08/23/20   Nugent, Gerrie Nordmann, NP    Family History Family History  Problem Relation Age of Onset   Asthma Mother    Seizures Mother    Diabetes Father    Depression Sister    Diabetes Sister        younger sister   Cancer Maternal Aunt        on dad's side   Cancer Maternal Uncle        on dad's side    Social History Social History   Tobacco Use   Smoking status: Never   Smokeless tobacco: Never  Vaping Use   Vaping Use: Never used  Substance Use Topics   Alcohol use: Not Currently   Drug use: No     Allergies   Acetaminophen   Review of Systems Review of Systems   Genitourinary:  Positive for frequency and vaginal discharge.     Physical Exam Triage Vital Signs ED Triage Vitals  Enc Vitals Group     BP 02/05/22 0952 108/74     Pulse Rate 02/05/22 0952 77     Resp 02/05/22 0952 16     Temp 02/05/22 0952 99 F (37.2 C)     Temp Source 02/05/22 0952 Oral     SpO2 02/05/22 0952 95 %     Weight --      Height --      Head Circumference --      Peak Flow --      Pain Score 02/05/22 0959 0     Pain Loc --      Pain Edu? --      Excl. in [place]? --    No data found.  Updated Vital Signs BP 108/74 (BP Location: Left Arm)   Pulse 77   Temp 99 F (37.2 C) (Oral)   Resp 16   LMP 12/27/2021 (Approximate)   SpO2 95%   Visual Acuity Right Eye Distance:   Left Eye Distance:   Bilateral Distance:    Right Eye Near:   Left Eye Near:    Bilateral Near:     Physical Exam Vitals and nursing note reviewed.  Constitutional:      Appearance: Normal appearance.  HENT:     Head: Normocephalic and atraumatic.  Eyes:     Pupils: Pupils are equal, round, and reactive to light.  Cardiovascular:     Rate and Rhythm: Normal rate.  Pulmonary:     Effort: Pulmonary effort is normal.  Abdominal:     Tenderness: There is no right CVA tenderness or left CVA tenderness.  Skin:    General: Skin is warm and dry.  Neurological:     General: No focal deficit present.     Mental Status: She is alert and oriented to person, place, and time.  Psychiatric:        Mood and Affect: Mood normal.        Behavior: Behavior normal.      UC Treatments / Results  Labs (all labs ordered are listed, but only abnormal results are displayed) Labs Reviewed  POCT URINALYSIS DIP (MANUAL ENTRY) - Abnormal; Notable for the following components:      Result Value   Color, UA straw (*)    Clarity, UA cloudy (*)    Blood, UA large (*)    Protein Ur, POC =100 (*)    All other  components within normal limits  URINE CULTURE  POCT URINE PREGNANCY  CERVICOVAGINAL  ANCILLARY ONLY    EKG   Radiology No results found.  Procedures Procedures (including critical care time)  Medications Ordered in UC Medications - No data to display  Initial Impression / Assessment and Plan / UC Course  I have reviewed the triage vital signs and the nursing notes.  Pertinent labs & imaging results that were available during my care of the patient were reviewed by me and considered in my medical decision making (see chart for details).     Reviewed exam and symptoms with patient.  Will send urine culture STD testing as ordered. Start Flagyl as patient reports symptoms similar to BV Rest and fluids Follow-up with PCP 2 to 3 days for recheck ER precautions reviewed and patient verbalized understanding Final Clinical Impressions(s) / UC Diagnoses   Final diagnoses:  Urinary frequency  Acute vaginitis     Discharge Instructions      Flagyl twice daily for 7 days.  No alcohol while on this medication Clinical contact you if the results of your urine culture STD testing are positive Rest and fluids PCP follow-up 2 to 3 days for recheck ER for any worsening symptoms     ED Prescriptions     Medication Sig Dispense Auth. Provider   metroNIDAZOLE (FLAGYL) 500 MG tablet Take 1 tablet (500 mg total) by mouth 2 (two) times daily for 7 days. 14 tablet Radford Pax, NP      PDMP not reviewed this encounter.   Radford Pax, NP 02/05/22 1057

## 2022-02-06 LAB — URINE CULTURE: Culture: <10000 — AB

## 2022-02-06 LAB — CERVICOVAGINAL ANCILLARY ONLY
Bacterial Vaginitis (gardnerella): POSITIVE — AB
Candida Glabrata: NEGATIVE
Candida Vaginitis: NEGATIVE
Chlamydia: NEGATIVE
Comment: NEGATIVE
Comment: NEGATIVE
Comment: NEGATIVE
Comment: NEGATIVE
Comment: NEGATIVE
Comment: NORMAL
Neisseria Gonorrhea: NEGATIVE
Trichomonas: NEGATIVE

## 2022-02-23 ENCOUNTER — Ambulatory Visit (INDEPENDENT_AMBULATORY_CARE_PROVIDER_SITE_OTHER): Payer: Medicaid Other | Admitting: Family Medicine

## 2022-02-23 VITALS — BP 121/72 | HR 96 | Wt 222.7 lb

## 2022-02-23 DIAGNOSIS — R1084 Generalized abdominal pain: Secondary | ICD-10-CM | POA: Diagnosis not present

## 2022-02-23 DIAGNOSIS — R102 Pelvic and perineal pain: Secondary | ICD-10-CM

## 2022-02-23 DIAGNOSIS — K5900 Constipation, unspecified: Secondary | ICD-10-CM | POA: Diagnosis not present

## 2022-02-23 MED ORDER — POLYETHYLENE GLYCOL 3350 17 GM/SCOOP PO POWD: 17.0000 g | Freq: Every day | ORAL | 1 refills | Status: DC | PRN

## 2022-02-23 NOTE — Assessment & Plan Note (Addendum)
Unclear etiology at present. Location not suggestive of biliary or gastric and recent CMP's have been normal. Does endorse significant constipation so will trial miralax and see if this helps, would also make sense given relationship with eating she reports. Low suspicion for endometritis as none noted on op note and no relationship to her cycle. Other major possibility to rule out is dislodgement of her Filshie clips, discussed MRI vs CT but she is claustrophobic so will proceed with CT. Briefly discussed possible outcomes including need for surgery if they have dislodged. Also discussed possible referral to Dr. Currie Paris if the above interventions are ineffective/do not find abnormalities.

## 2022-02-23 NOTE — Progress Notes (Signed)
Patient had tubal in July of 2023, states that pain started happening November of 2023. States it is extreme abdominal pain, feels like constant cramping. Will come and go.  No bleeding or discharge. No concerns for bacteria or infections. Was recently dx with BV 1/7, completed Flagyl. Pap UTD  Altha Harm, CMA

## 2022-02-23 NOTE — Progress Notes (Signed)
GYNECOLOGY OFFICE VISIT NOTE  History:   Deborah Singleton is a 27 y.o. [id number] here today for pain after postpartum BTL.  Had uncomplicated PP BTL on 06/01/7739 with Dr. Rip Harbour, filshie clips A few months after that began to have pain Used to happen daily but now only happening about once a week Primarily located in her upper abdomen across the top, not worse on one side or the other When pain occurs it lasts 5-10 minutes It is an intense cramping sensation No associated nausea, vomiting, sweating Does not think there is any connection with her menstrual cycle that she has noticed Has noticed a relationship with eating but this is not consistent Has tried ibuprofen which helps sometimes, other times it does not Has been to the ED multiple times over past several months for this issue No hx of gonorrhea or chlamydia No burning or pain with urination Endorses constipation, identifies stool type as bristol 1-2 of late   Health Maintenance Due  Topic Date Due   COVID-19 Vaccine (1) Never done   HPV VACCINES (1 - 2-dose series) Never done   INFLUENZA VACCINE  08/30/2021    Past Medical History:  Diagnosis Date   Miscarriage 06/02/2019   Seizures ([place])    last seizure Aug 2021    Past Surgical History:  Procedure Laterality Date   TOOTH EXTRACTION     TUBAL LIGATION Bilateral 08/21/2020   Procedure: POST PARTUM TUBAL LIGATION;  Surgeon: Chancy Milroy, MD;  Location: [facility] LD ORS;  Service: Gynecology;  Laterality: Bilateral;    The following portions of the patient's history were reviewed and updated as appropriate: allergies, current medications, past family history, past medical history, past social history, past surgical history and problem list.   Health Maintenance:   Last pap: Lab Results  Component Value Date   DIAGPAP  02/26/2020    - Negative for intraepithelial lesion or malignancy (NILM)   Up to date  Last mammogram:  N/a    Review of Systems:  Pertinent  items noted in HPI and remainder of comprehensive ROS otherwise negative.  Physical Exam:  BP 121/72   Pulse 96   Wt 222 lb 11.2 oz (101 kg)   BMI 40.73 kg/m  CONSTITUTIONAL: Well-developed, well-nourished female in no acute distress.  HEENT:  Normocephalic, atraumatic. External right and left ear normal. No scleral icterus.  NECK: Normal range of motion, supple, no masses noted on observation SKIN: No rash noted. Not diaphoretic. No erythema. No pallor. MUSCULOSKELETAL: Normal range of motion. No edema noted. NEUROLOGIC: Alert and oriented to person, place, and time. Normal muscle tone coordination.  PSYCHIATRIC: Normal mood and affect. Normal behavior. Normal judgment and thought content. RESPIRATORY: Effort normal, no problems with respiration noted ABDOMEN: No masses noted. No other overt distention noted.  Mild ttp in the lower quadrants bilaterally PELVIC: no cervical motion tenderness, uterus deep and difficult to palpate but does not feel enlarged, no adnexal masses, mild appropriate tenderness throughout  Labs and Imaging No results found for this or any previous visit (from the past 168 hour(s)). No results found.    Assessment and Plan:   Problem List Items Addressed This Visit       Other   Pelvic pain - Primary    Unclear etiology at present. Location not suggestive of biliary or gastric and recent CMP's have been normal. Does endorse significant constipation so will trial miralax and see if this helps, would also make sense given relationship with eating  she reports. Low suspicion for endometritis as none noted on op note and no relationship to her cycle. Other major possibility to rule out is dislodgement of her Filshie clips, discussed MRI vs CT but she is claustrophobic so will proceed with CT. Briefly discussed possible outcomes including need for surgery if they have dislodged. Also discussed possible referral to Dr. Currie Paris if the above interventions are  ineffective/do not find abnormalities.      Relevant Orders   CT ABDOMEN PELVIS WO CONTRAST   Other Visit Diagnoses     Constipation, unspecified constipation type       Relevant Medications   polyethylene glycol powder (GLYCOLAX/MIRALAX) 17 GM/SCOOP powder   Generalized abdominal pain       Relevant Orders   CT ABDOMEN PELVIS WO CONTRAST       Routine preventative health maintenance measures emphasized. Please refer to After Visit Summary for other counseling recommendations.   Return in about 2 months (around 04/24/2022) for follow up pelvic pain.    Total face-to-face time with patient: 25 minutes.  Over 50% of encounter was spent on counseling and coordination of care.   Clarnce Flock, MD/MPH Attending Family Medicine Physician, [facility] for [facility], [place]

## 2022-02-26 DIAGNOSIS — R519 Headache, unspecified: Secondary | ICD-10-CM | POA: Diagnosis not present

## 2022-02-26 DIAGNOSIS — R10814 Left lower quadrant abdominal tenderness: Secondary | ICD-10-CM | POA: Diagnosis not present

## 2022-02-26 DIAGNOSIS — S199XXA Unspecified injury of neck, initial encounter: Secondary | ICD-10-CM | POA: Diagnosis not present

## 2022-02-26 DIAGNOSIS — M542 Cervicalgia: Secondary | ICD-10-CM | POA: Diagnosis not present

## 2022-02-26 DIAGNOSIS — Y92410 Unspecified street and highway as the place of occurrence of the external cause: Secondary | ICD-10-CM | POA: Diagnosis not present

## 2022-02-26 DIAGNOSIS — S0990XA Unspecified injury of head, initial encounter: Secondary | ICD-10-CM | POA: Diagnosis not present

## 2022-03-07 ENCOUNTER — Telehealth: Payer: Self-pay | Admitting: Family Medicine

## 2022-03-07 NOTE — Telephone Encounter (Signed)
Called insurance company to initiate peer to peer for CT Abdomen/Pelvis I had recently ordered.   After discussion they have authorized the imaging. Authorization number: [id number], valid from jan 30 through march 29.  Clarnce Flock, MD/MPH Attending Family Medicine Physician, [facility] for [facility], [place]

## 2022-03-13 ENCOUNTER — Ambulatory Visit (HOSPITAL_COMMUNITY): Payer: Medicaid Other

## 2022-03-28 ENCOUNTER — Ambulatory Visit (HOSPITAL_COMMUNITY)
Admission: RE | Admit: 2022-03-28 | Discharge: 2022-03-28 | Disposition: A | Discharge status: Home / Self Care | Payer: Medicaid Other | Source: Ambulatory Visit | Attending: Family Medicine | Admitting: Family Medicine

## 2022-03-28 DIAGNOSIS — R1084 Generalized abdominal pain: Secondary | ICD-10-CM | POA: Diagnosis not present

## 2022-03-28 DIAGNOSIS — R109 Unspecified abdominal pain: Secondary | ICD-10-CM | POA: Diagnosis not present

## 2022-03-28 DIAGNOSIS — R102 Pelvic and perineal pain: Secondary | ICD-10-CM | POA: Diagnosis not present

## 2022-03-30 ENCOUNTER — Telehealth: Payer: Self-pay | Admitting: Family Medicine

## 2022-03-30 NOTE — Telephone Encounter (Signed)
Attempted to call patient, no answer and voicemail inbox was full.   Will send detailed mychart message.

## 2022-04-04 ENCOUNTER — Ambulatory Visit
Admission: EM | Admit: 2022-04-04 | Discharge: 2022-04-04 | Disposition: A | Discharge status: Home / Self Care | Payer: Medicaid Other | Attending: Internal Medicine | Admitting: Internal Medicine

## 2022-04-04 ENCOUNTER — Encounter: Payer: Self-pay | Admitting: Emergency Medicine

## 2022-04-04 ENCOUNTER — Other Ambulatory Visit: Payer: Self-pay

## 2022-04-04 DIAGNOSIS — M545 Low back pain, unspecified: Secondary | ICD-10-CM

## 2022-04-04 MED ORDER — LIDOCAINE 5 % EX PTCH: 1.0000 | MEDICATED_PATCH | CUTANEOUS | 0 refills | Status: DC

## 2022-04-04 MED ORDER — IBUPROFEN 600 MG PO TABS: 600.0000 mg | ORAL_TABLET | Freq: Four times a day (QID) | ORAL | 0 refills | Status: AC | PRN

## 2022-04-04 NOTE — ED Provider Notes (Addendum)
[facility]    CSN: [id number] Arrival date & time: 04/04/22  0801      History   Chief Complaint Chief Complaint  Patient presents with   Back Pain    HPI Deborah Singleton is a 27 y.o. female.   Patient presents with left-sided lower back pain that started about 2 to 3 days ago.  Patient reports that she has a history of chronic back pain after getting an epidural about 2 years ago.  Reports pain is typically in the mid back but over the past 2 to 3 days it has been in the left lower back.  Pain does not radiate down legs.  Denies numbness or tingling.  Movement exacerbates pain.  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  Patient has not taken any over-the-counter medications to alleviate symptoms.  Denies any dysuria, urinary frequency, urinary or bowel continence, saddle anesthesia.   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 Authorizing Provider  ibuprofen (ADVIL) 600 MG tablet Take 1 tablet (600 mg total) by mouth every 6 (six) hours as needed for mild pain or moderate pain. 04/04/22  Yes Kechia Yahnke, Hildred Alamin E, FNP  lidocaine (LIDODERM) 5 % Place 1 patch onto the skin daily. Remove & Discard patch within 12 hours or as directed by MD 04/04/22  Yes Teodora Medici, FNP  levETIRAcetam (KEPPRA) 500 MG tablet Take 1/2 tablet twice a day for 3 days, then increase to 1 tablet twice a day 01/14/20   Cameron Sprang, MD  nitrofurantoin, macrocrystal-monohydrate, (MACROBID) 100 MG capsule Take 1 capsule (100 mg total) by mouth 2 (two) times daily. Patient not taking: Reported on 02/05/2022 02/09/21   Francene Finders, PA-C  ondansetron (ZOFRAN-ODT) 4 MG disintegrating tablet Take 1 tablet (4 mg total) by mouth every 8 (eight) hours as needed for vomiting or nausea. 09/19/21   Teodora Medici, FNP  oxyCODONE (OXY IR/ROXICODONE) 5 MG immediate release tablet Take 1 tablet (5 mg total) by mouth every 6 (six) hours as needed for moderate pain, severe pain or breakthrough pain. Patient  not taking: Reported on 09/02/2020 08/22/20   Nugent, Gerrie Nordmann, NP  polyethylene glycol powder (GLYCOLAX/MIRALAX) 17 GM/SCOOP powder Take 17 g by mouth daily as needed. 02/23/22   Clarnce Flock, MD  Prenatal Vit-Fe Fumarate-FA (PRENATAL MULTIVITAMIN) TABS tablet Take 1 tablet by mouth daily at 12 noon. Patient not taking: Reported on 09/30/2020    [provider]  senna-docusate (SENOKOT-S) 8.6-50 MG tablet Take 2 tablets by mouth daily. Patient not taking: Reported on 09/02/2020 08/23/20   Nugent, Gerrie Nordmann, NP    Family History Family History  Problem Relation Age of Onset   Asthma Mother    Seizures Mother    Diabetes Father    Depression Sister    Diabetes Sister        younger sister   Cancer Maternal Aunt        on dad's side    Cancer Maternal Uncle        on dad's side    Social History Social History   Tobacco Use   Smoking status: Never   Smokeless tobacco: Never  Vaping Use   Vaping Use: Never used  Substance Use Topics   Alcohol use: Not Currently   Drug use: No     Allergies   Acetaminophen   Review of Systems Review of Systems Per HPI  Physical Exam Triage Vital Signs ED Triage Vitals  Enc Vitals Group     BP 04/04/22 0824 109/77     Pulse Rate 04/04/22 0824 94     Resp 04/04/22 0824 18     Temp 04/04/22 0824 98 F (36.7 C)     Temp Source 04/04/22 0824 Oral     SpO2 04/04/22 0824 99 %     Weight --      Height --      Head Circumference --      Peak Flow --      Pain Score 04/04/22 0825 4     Pain Loc --      Pain Edu? --      Excl. in [place]? --    No data found.  Updated Vital Signs BP 109/77 (BP Location: Left Arm)   Pulse 94   Temp 98 F (36.7 C) (Oral)   Resp 18   SpO2 99%   Visual Acuity Right Eye Distance:   Left Eye Distance:   Bilateral Distance:    Right Eye Near:   Left Eye Near:    Bilateral Near:     Physical Exam Constitutional:      General: She is not in acute distress.    Appearance: Normal appearance. She is not toxic-appearing or diaphoretic.  HENT:     Head: Normocephalic and atraumatic.  Eyes:     Extraocular Movements: Extraocular movements intact.     Conjunctiva/sclera: Conjunctivae normal.  Pulmonary:     Effort: Pulmonary effort is normal.  Musculoskeletal:     Lumbar back: Tenderness present. No swelling or bony tenderness. Negative right straight leg raise test and negative left straight leg raise test.       Back:     Comments: Tenderness to palpation to left lower lumbar region.  There is no direct spinal tenderness, crepitus, step-off.  No swelling or discoloration noted. Grip strength 5/5.   Neurological:     General: No focal deficit present.     Mental Status: She is alert and oriented to person, place, and time. Mental  status is at baseline.  Psychiatric:  Mood and Affect: Mood normal.        Behavior: Behavior normal.        Thought Content: Thought content normal.        Judgment: Judgment normal.      UC Treatments / Results  Labs (all labs ordered are listed, but only abnormal results are displayed) Labs Reviewed - No data to display  EKG   Radiology No results found.  Procedures Procedures (including critical care time)  Medications Ordered in UC Medications - No data to display  Initial Impression / Assessment and Plan / UC Course  I have reviewed the triage vital signs and the nursing notes.  Pertinent labs & imaging results that were available during my care of the patient were reviewed by me and considered in my medical decision making (see chart for details).     Patient is reporting back pain intermittently for the past 2 years after getting an epidural, although patient reports that back pain is typically midline.  This current back pain is on the left lower side.  Suspect possible muscle strain.  Do not think imaging is necessary given no direct spinal tenderness or injury. although, I do think that follow-up with spine specialty given duration of chronic back pain is reasonable so patient was provided with contact information for spine specialty to follow-up.  Will treat with lidocaine patch and NSAID given limited options on pain management.  Patient has a history of seizures so a muscle relaxer and prednisone is risky given it can lower seizure threshold.  Patient has taken ibuprofen before and tolerated well.  No contraindications to NSAIDs in patient's history.  Advised supportive care.  Patient denies pregnancy or breast-feeding so these medications should be safe.  Advised strict return precautions.  Patient verbalized understanding and was agreeable with plan. Final Clinical Impressions(s) / UC Diagnoses   Final diagnoses:  Acute left-sided low back pain without  sciatica     Discharge Instructions      I have prescribed you prescription ibuprofen and a lidocaine patch to help alleviate your back pain.  Recommend that you call the spine specialty at provided contact information for further evaluation and management given duration of time since that pain originally started.    ED Prescriptions     Medication Sig Dispense Auth. Provider   ibuprofen (ADVIL) 600 MG tablet Take 1 tablet (600 mg total) by mouth every 6 (six) hours as needed for mild pain or moderate pain. 30 tablet [place], [place] E, [place]   lidocaine (LIDODERM) 5 % Place 1 patch onto the skin daily. Remove & Discard patch within 12 hours or as directed by MD 30 patch Agar, Michele Rockers, FNP      PDMP not reviewed this encounter.   Teodora Medici, [place] 04/04/22 [place], [place], [place] 04/04/22 [phone]

## 2022-04-04 NOTE — ED Triage Notes (Signed)
Pt sts left sided lower back pain x 2 years since epidural that is worse over last few days

## 2022-04-04 NOTE — Discharge Instructions (Signed)
I have prescribed you prescription ibuprofen and a lidocaine patch to help alleviate your back pain.  Recommend that you call the spine specialty at provided contact information for further evaluation and management given duration of time since that pain originally started.

## 2022-04-25 ENCOUNTER — Ambulatory Visit: Payer: Medicaid Other | Admitting: Obstetrics and Gynecology

## 2022-05-07 ENCOUNTER — Emergency Department (HOSPITAL_COMMUNITY): Payer: Medicaid Other

## 2022-05-07 ENCOUNTER — Emergency Department (HOSPITAL_COMMUNITY)
Admission: EM | Admit: 2022-05-07 | Discharge: 2022-05-08 | Disposition: A | Discharge status: Home / Self Care | Payer: Medicaid Other | Attending: Emergency Medicine | Admitting: Emergency Medicine

## 2022-05-07 ENCOUNTER — Other Ambulatory Visit: Payer: Self-pay

## 2022-05-07 ENCOUNTER — Encounter (HOSPITAL_COMMUNITY): Payer: Self-pay

## 2022-05-07 DIAGNOSIS — E669 Obesity, unspecified: Secondary | ICD-10-CM | POA: Insufficient documentation

## 2022-05-07 DIAGNOSIS — R0602 Shortness of breath: Secondary | ICD-10-CM | POA: Diagnosis not present

## 2022-05-07 DIAGNOSIS — R457 State of emotional shock and stress, unspecified: Secondary | ICD-10-CM | POA: Diagnosis not present

## 2022-05-07 DIAGNOSIS — J40 Bronchitis, not specified as acute or chronic: Secondary | ICD-10-CM | POA: Diagnosis not present

## 2022-05-07 DIAGNOSIS — R059 Cough, unspecified: Secondary | ICD-10-CM | POA: Diagnosis not present

## 2022-05-07 DIAGNOSIS — Z6841 Body Mass Index (BMI) 40.0 and over, adult: Secondary | ICD-10-CM | POA: Diagnosis not present

## 2022-05-07 DIAGNOSIS — R064 Hyperventilation: Secondary | ICD-10-CM | POA: Diagnosis not present

## 2022-05-07 LAB — I-STAT CHEM 8, ED
BUN: 9 mg/dL (ref 6–20)
Calcium, Ion: 1.17 mmol/L (ref 1.15–1.40)
Chloride: 106 mmol/L (ref 98–111)
Creatinine, Ser: 0.7 mg/dL (ref 0.44–1.00)
Glucose, Bld: 107 mg/dL — ABNORMAL HIGH (ref 70–99)
HCT: 35 % — ABNORMAL LOW (ref 36.0–46.0)
Hemoglobin: 11.9 g/dL — ABNORMAL LOW (ref 12.0–15.0)
Potassium: 4 mmol/L (ref 3.5–5.1)
Sodium: 138 mmol/L (ref 135–145)
TCO2: 24 mmol/L (ref 22–32)

## 2022-05-07 LAB — I-STAT BETA HCG BLOOD, ED (MC, WL, AP ONLY): I-stat hCG, quantitative: <5 m[IU]/mL (ref <5)

## 2022-05-07 MED ORDER — DEXAMETHASONE SODIUM PHOSPHATE 10 MG/ML IJ SOLN
10.0000 mg | Freq: Once | INTRAMUSCULAR | Status: AC
  Administered 2022-05-07: 10 mg via INTRAMUSCULAR

## 2022-05-07 MED ORDER — HYDROCODONE BIT-HOMATROP MBR 5-1.5 MG/5ML PO SOLN
5.0000 mL | Freq: Once | ORAL | Status: AC
  Administered 2022-05-07: 5 mL via ORAL
  Filled 2022-05-07: qty 5

## 2022-05-07 MED ORDER — PROMETHAZINE HCL 25 MG/ML IJ SOLN
12.5000 mg | Freq: Once | INTRAMUSCULAR | Status: AC
  Administered 2022-05-07: 12.5 mg via INTRAMUSCULAR
  Filled 2022-05-07: qty 1

## 2022-05-07 MED ORDER — PREDNISONE 20 MG PO TABS: 40.0000 mg | ORAL_TABLET | Freq: Every day | ORAL | 0 refills | Status: DC

## 2022-05-07 MED ORDER — DEXAMETHASONE SODIUM PHOSPHATE 10 MG/ML IJ SOLN
10.0000 mg | Freq: Once | INTRAMUSCULAR | Status: DC
  Filled 2022-05-07: qty 1

## 2022-05-07 MED ORDER — FEXOFENADINE HCL 60 MG PO TABS: 60.0000 mg | ORAL_TABLET | Freq: Two times a day (BID) | ORAL | 0 refills | Status: AC

## 2022-05-07 MED ORDER — ALBUTEROL SULFATE HFA 108 (90 BASE) MCG/ACT IN AERS: 2.0000 | INHALATION_SPRAY | Freq: Four times a day (QID) | RESPIRATORY_TRACT | Status: DC

## 2022-05-07 NOTE — ED Provider Notes (Signed)
[place] EMERGENCY DEPARTMENT AT [facility] Provider Note   CSN: [id number] Arrival date & time: 05/07/22  2137     History  Chief Complaint  Patient presents with   Shortness of Breath    Deborah Singleton is a 27 y.o. female.  HPI Presents via EMS with concern for ongoing cough, dyspnea.  Patient has no history of asthma, does have a history of obesity as well as allergies to pollen.  She notes that over the past 3 days she has had worsening cough, dyspnea, generalized discomfort. No persistent pain. No relief with anything. EMS reports the patient had sinus tachycardia, no hypotension en route.     Home Medications Prior to Admission medications   Medication Sig Start Date End Date Taking? Authorizing Provider  fexofenadine (ALLEGRA) 60 MG tablet Take 1 tablet (60 mg total) by mouth 2 (two) times daily. 05/07/22 06/06/22 Yes Gerhard Munch, MD  predniSONE (DELTASONE) 20 MG tablet Take 2 tablets (40 mg total) by mouth daily with breakfast. For the next four days 05/07/22  Yes Gerhard Munch, MD  ibuprofen (ADVIL) 600 MG tablet Take 1 tablet (600 mg total) by mouth every 6 (six) hours as needed for mild pain or moderate pain. 04/04/22   Gustavus Bryant, FNP  levETIRAcetam (KEPPRA) 500 MG tablet Take 1/2 tablet twice a day for 3 days, then increase to 1 tablet twice a day 01/14/20   Van Clines, MD  lidocaine (LIDODERM) 5 % Place 1 patch onto the skin daily. Remove & Discard patch within 12 hours or as directed by MD 04/04/22   Gustavus Bryant, FNP  nitrofurantoin, macrocrystal-monohydrate, (MACROBID) 100 MG capsule Take 1 capsule (100 mg total) by mouth 2 (two) times daily. Patient not taking: Reported on 02/05/2022 02/09/21   Tomi Bamberger, PA-C  ondansetron (ZOFRAN-ODT) 4 MG disintegrating tablet Take 1 tablet (4 mg total) by mouth every 8 (eight) hours as needed for vomiting or nausea. 09/19/21   Gustavus Bryant, FNP  oxyCODONE (OXY IR/ROXICODONE) 5 MG immediate release  tablet Take 1 tablet (5 mg total) by mouth every 6 (six) hours as needed for moderate pain, severe pain or breakthrough pain. Patient not taking: Reported on 09/02/2020 08/22/20   Nugent, Odie Sera, NP  polyethylene glycol powder (GLYCOLAX/MIRALAX) 17 GM/SCOOP powder Take 17 g by mouth daily as needed. 02/23/22   Venora Maples, MD  Prenatal Vit-Fe Fumarate-FA (PRENATAL MULTIVITAMIN) TABS tablet Take 1 tablet by mouth daily at 12 noon. Patient not taking: Reported on 09/30/2020    [provider]  senna-docusate (SENOKOT-S) 8.6-50 MG tablet Take 2 tablets by mouth daily. Patient not taking: Reported on 09/02/2020 08/23/20   Nugent, Odie Sera, NP      Allergies    Acetaminophen    Review of Systems   Review of Systems  All other systems reviewed and are negative.   Physical Exam Updated Vital Signs BP 108/70   Pulse (!) 109   Temp 98.4 F (36.9 C)   Resp 19   Ht 5\' 1"  (1.549 m)   Wt 101.6 kg   SpO2 100%   BMI 42.32 kg/m  Physical Exam Vitals and nursing note reviewed.  Constitutional:      Appearance: She is well-developed. She is obese. She is ill-appearing.  HENT:     Head: Normocephalic and atraumatic.  Eyes:     Conjunctiva/sclera: Conjunctivae normal.  Cardiovascular:     Rate and Rhythm: Regular rhythm. Tachycardia present.  Pulmonary:  Effort: Tachypnea and accessory muscle usage present.     Breath sounds: Decreased breath sounds present.  Abdominal:     General: There is no distension.  Skin:    General: Skin is warm and dry.  Neurological:     Mental Status: She is alert and oriented to person, place, and time.     Cranial Nerves: No cranial nerve deficit.  Psychiatric:        Mood and Affect: Mood normal.     ED Results / Procedures / Treatments   Labs (all labs ordered are listed, but only abnormal results are displayed) Labs Reviewed  I-STAT CHEM 8, ED - Abnormal; Notable for the following components:      Result Value   Glucose, Bld 107  (*)    Hemoglobin 11.9 (*)    HCT 35.0 (*)    All other components within normal limits  I-STAT BETA HCG BLOOD, ED ([facility], [facility], [facility] ONLY)    EKG None  Radiology DG Chest 2 View  Result Date: 05/07/2022 CLINICAL DATA:  Cough and shortness of breath. EXAM: CHEST - 2 VIEW COMPARISON:  December 26, 2018 FINDINGS: The cardiac silhouette is mildly enlarged and unchanged in size. Low lung volumes are seen. Mildly increased bilateral perihilar and bilateral infrahilar lung markings are noted. There is no evidence of focal consolidation, pleural effusion or pneumothorax. The visualized skeletal structures are unremarkable. IMPRESSION: Low lung volumes with additional findings that may represent mild viral bronchitis versus reactive airway disease. Electronically Signed   By: Aram Candela M.D.   On: 05/07/2022 22:17    Procedures Procedures    Medications Ordered in ED Medications  albuterol (VENTOLIN HFA) 108 (90 Base) MCG/ACT inhaler 2 puff (has no administration in time range)  promethazine (PHENERGAN) injection 12.5 mg (12.5 mg Intramuscular Given 05/07/22 2223)  HYDROcodone bit-homatropine (HYCODAN) 5-1.5 MG/5ML syrup 5 mL (5 mLs Oral Given 05/07/22 2316)  dexamethasone (DECADRON) injection 10 mg (10 mg Intramuscular Given 05/07/22 2243)    ED Course/ Medical Decision Making/ A&P                             Medical Decision Making Obese young adult female with seasonal allergies presents with cough, congestion, shortness of breath.  Differential includes seasonal allergy exacerbation, bronchitis, pneumonia, bacteremia, sepsis. Patient received multiple medications, continuous cardiac monitoring.   Amount and/or Complexity of Data Reviewed Independent Historian: EMS Labs: ordered. Decision-making details documented in ED Course. Radiology: ordered and independent interpretation performed. Decision-making details documented in ED Course. ECG/medicine tests: ordered and independent  interpretation performed. Decision-making details documented in ED Course.  Risk Prescription drug management.  Cardiac 95 sinus normal Pulse ox 100% on room air, though patient does state that she feels more comfortable with 3 L via nasal cannula.  11:24 PM Patient in no distress, awake, alert, minimally tachycardic, not hypoxic, hypotensive, using his cellular telephone without evidence for distress.  I reviewed her x-ray, labs, labs unremarkable, she is not pregnant.  X-ray without evidence for pneumonia, but there is evidence for bronchitis. Patient will start albuterol, steroids, appropriate medication for seasonal allergies, follow-up with primary care.         Final Clinical Impression(s) / ED Diagnoses Final diagnoses:  Bronchitis    Rx / DC Orders ED Discharge Orders          Ordered    predniSONE (DELTASONE) 20 MG tablet  Daily with breakfast  05/07/22 2324    fexofenadine (ALLEGRA) 60 MG tablet  2 times daily        05/07/22 2324              Gerhard MunchLockwood, Thersea Manfredonia, MD 05/07/22 2324

## 2022-05-07 NOTE — Discharge Instructions (Addendum)
As discussed, you have been diagnosed with bronchitis.  Typically this improves with the prescribed medications, and the provided albuterol inhaler.  You please use this albuterol inhaler every 4 hours for the next 2 days, then as needed.  Please obtain and take medications as prescribed.  Follow-up with your physician or return here for concerning changes in your condition.

## 2022-05-07 NOTE — ED Notes (Signed)
Patient transported to X-ray 

## 2022-05-07 NOTE — ED Notes (Signed)
Patient back in room from X-ray 

## 2022-05-07 NOTE — ED Triage Notes (Signed)
BIB [facility] from home for shortness of breath and cough. Has been going on for 3 days. Patient was placed on 3L of oxygen with EMS although oxygen sats were fine just to help with shortness of breath. [facility] reports she has allergies but never this bad. No history of asthma, patient does have a history of seizures she takes keppra daily for it.

## 2022-05-09 ENCOUNTER — Telehealth: Payer: Self-pay

## 2022-05-09 NOTE — Transitions of Care (Post Inpatient/ED Visit) (Signed)
   05/09/2022  Name: Deborah Singleton MRN: [id number] DOB: 02-12-1995  Today's TOC FU Call Status: Today's TOC FU Call Status:: Successful TOC FU Call Competed TOC FU Call Complete Date: 05/09/22  Transition Care Management Follow-up Telephone Call Date of Discharge: 05/08/22 Discharge Facility: [facility]) Type of Discharge: Emergency Department Reason for ED Visit: Other: (Bronicitis) How have you been since you were released from the hospital?:  (a little better) Any questions or concerns?: No  Items Reviewed: Did you receive and understand the discharge instructions provided?: Yes Medications obtained and verified?: Yes (Medications Reviewed) (patient stated she was able to get the Presidone but unable to get the other medication. Patient states pharmacy needs a MD approval. BSW encouraged patient to contact her back if she was unable to get) Any new allergies since your discharge?: No Dietary orders reviewed?: NA Do you have support at home?: Yes  Home Care and Equipment/Supplies: Were Home Health Services Ordered?: NA Any new equipment or medical supplies ordered?: NA  Functional Questionnaire: Do you need assistance with bathing/showering or dressing?: No Do you need assistance with meal preparation?: No Do you need assistance with eating?: No Do you have difficulty maintaining continence: No Do you need assistance with getting out of bed/getting out of a chair/moving?: No Do you have difficulty managing or taking your medications?: No  Follow up appointments reviewed: PCP Follow-up appointment confirmed?: No (Patient states her PCP is at [facility]) MD Provider Line Number:[phone] Given: No Specialist Hospital Follow-up appointment confirmed?: No Do you need transportation to your follow-up appointment?: No Do you understand care options if your condition(s) worsen?: Yes-patient verbalized understanding  BSW provided patient with case management options.  Encouraged patient to contact her back if she is unable to obtain medication.   Abelino Derrick, MHA [facility]  Managed [facility] Social Worker [phone]

## 2022-05-19 ENCOUNTER — Other Ambulatory Visit: Payer: Self-pay

## 2022-05-19 ENCOUNTER — Encounter (HOSPITAL_COMMUNITY): Payer: Self-pay

## 2022-05-19 ENCOUNTER — Emergency Department (HOSPITAL_COMMUNITY)
Admission: EM | Admit: 2022-05-19 | Discharge: 2022-05-19 | Disposition: A | Discharge status: Home / Self Care | Payer: Medicaid Other | Attending: Emergency Medicine | Admitting: Emergency Medicine

## 2022-05-19 DIAGNOSIS — R569 Unspecified convulsions: Secondary | ICD-10-CM | POA: Diagnosis not present

## 2022-05-19 DIAGNOSIS — R9431 Abnormal electrocardiogram [ECG] [EKG]: Secondary | ICD-10-CM | POA: Diagnosis not present

## 2022-05-19 DIAGNOSIS — S01512A Laceration without foreign body of oral cavity, initial encounter: Secondary | ICD-10-CM | POA: Insufficient documentation

## 2022-05-19 DIAGNOSIS — X58XXXA Exposure to other specified factors, initial encounter: Secondary | ICD-10-CM | POA: Insufficient documentation

## 2022-05-19 DIAGNOSIS — G4089 Other seizures: Secondary | ICD-10-CM | POA: Diagnosis not present

## 2022-05-19 DIAGNOSIS — I10 Essential (primary) hypertension: Secondary | ICD-10-CM | POA: Diagnosis not present

## 2022-05-19 DIAGNOSIS — R58 Hemorrhage, not elsewhere classified: Secondary | ICD-10-CM | POA: Diagnosis not present

## 2022-05-19 LAB — BASIC METABOLIC PANEL
Anion gap: 7 (ref 5–15)
BUN: 5 mg/dL — ABNORMAL LOW (ref 6–20)
CO2: 22 mmol/L (ref 22–32)
Calcium: 8.8 mg/dL — ABNORMAL LOW (ref 8.9–10.3)
Chloride: 107 mmol/L (ref 98–111)
Creatinine, Ser: 0.83 mg/dL (ref 0.44–1.00)
GFR, Estimated: >60 mL/min (ref >60)
Glucose, Bld: 105 mg/dL — ABNORMAL HIGH (ref 70–99)
Potassium: 4 mmol/L (ref 3.5–5.1)
Sodium: 136 mmol/L (ref 135–145)

## 2022-05-19 LAB — URINALYSIS, ROUTINE W REFLEX MICROSCOPIC
Bilirubin Urine: NEGATIVE
Glucose, UA: NEGATIVE mg/dL
Hgb urine dipstick: NEGATIVE
Ketones, ur: NEGATIVE mg/dL
Leukocytes,Ua: NEGATIVE
Nitrite: NEGATIVE
Protein, ur: NEGATIVE mg/dL
Specific Gravity, Urine: 1.014 (ref 1.005–1.030)
pH: 6 (ref 5.0–8.0)

## 2022-05-19 LAB — I-STAT BETA HCG BLOOD, ED (MC, WL, AP ONLY): I-stat hCG, quantitative: <5 m[IU]/mL (ref <5)

## 2022-05-19 LAB — CBC
HCT: 38.8 % (ref 36.0–46.0)
Hemoglobin: 12.3 g/dL (ref 12.0–15.0)
MCH: 27.4 pg (ref 26.0–34.0)
MCHC: 31.7 g/dL (ref 30.0–36.0)
MCV: 86.4 fL (ref 80.0–100.0)
Platelets: 355 10*3/uL (ref 150–400)
RBC: 4.49 MIL/uL (ref 3.87–5.11)
RDW: 12.9 % (ref 11.5–15.5)
WBC: 11.4 10*3/uL — ABNORMAL HIGH (ref 4.0–10.5)
nRBC: 0 % (ref 0.0–0.2)

## 2022-05-19 LAB — RAPID URINE DRUG SCREEN, HOSP PERFORMED
Amphetamines: NOT DETECTED
Barbiturates: NOT DETECTED
Benzodiazepines: NOT DETECTED
Cocaine: NOT DETECTED
Opiates: NOT DETECTED
Tetrahydrocannabinol: NOT DETECTED

## 2022-05-19 LAB — CBG MONITORING, ED: Glucose-Capillary: 92 mg/dL (ref 70–99)

## 2022-05-19 MED ORDER — IBUPROFEN 800 MG PO TABS
800.0000 mg | ORAL_TABLET | Freq: Once | ORAL | Status: AC
  Administered 2022-05-19: 800 mg via ORAL
  Filled 2022-05-19: qty 1

## 2022-05-19 MED ORDER — ACETAMINOPHEN 500 MG PO TABS: 1000.0000 mg | ORAL_TABLET | Freq: Once | ORAL | Status: DC

## 2022-05-19 MED ORDER — LEVETIRACETAM IN NACL 1000 MG/100ML IV SOLN
1000.0000 mg | Freq: Once | INTRAVENOUS | Status: AC
  Administered 2022-05-19: 1000 mg via INTRAVENOUS
  Filled 2022-05-19: qty 100

## 2022-05-19 MED ORDER — LEVETIRACETAM 500 MG PO TABS: ORAL_TABLET | ORAL | 2 refills | Status: DC

## 2022-05-19 NOTE — Discharge Instructions (Addendum)
Patient emergency department today after a seizure.  As we discussed we did not find an emergent etiology for your symptoms.  I think it is likely related to your previous diagnosis of epilepsy.  I am restarting you on your previous dose of Keppra. It is incredibly important that you follow-up with a neurologist.  I have sent a referral for you since it has been many years.  Per [facility] statutes, patients with seizures are not allowed to drive until  they have been seizure-free for six months. Use caution when using heavy equipment or power tools. Avoid working on ladders or at heights. Take showers instead of baths. Ensure the water temperature is not too high on the home water heater. Do not go swimming alone. When caring for infants or small children, sit down when holding, feeding, or changing them to minimize risk of injury to the child in the event you have a seizure.  Also, maintain good sleep hygiene. Avoid alcohol or other drugs.
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
## 2022-05-19 NOTE — ED Triage Notes (Signed)
Pt BIB EMS, for seizures, patient felt the seizure coming on and she laid down to prevent a fall, bystander states it was more of a focal seizure, and full body tense seizure.  Hx of seizures, EMS states pt was post tical, drooling, and bite her tongue.    122/64 95 HR CBG 115 98% RA

## 2022-05-21 ENCOUNTER — Other Ambulatory Visit: Payer: Self-pay

## 2022-05-21 ENCOUNTER — Encounter (HOSPITAL_COMMUNITY): Payer: Self-pay | Admitting: *Deleted

## 2022-05-21 ENCOUNTER — Emergency Department (HOSPITAL_COMMUNITY)
Admission: EM | Admit: 2022-05-21 | Discharge: 2022-05-21 | Disposition: A | Discharge status: Home / Self Care | Payer: Medicaid Other | Attending: Emergency Medicine | Admitting: Emergency Medicine

## 2022-05-21 DIAGNOSIS — R110 Nausea: Secondary | ICD-10-CM | POA: Diagnosis not present

## 2022-05-21 DIAGNOSIS — R519 Headache, unspecified: Secondary | ICD-10-CM | POA: Insufficient documentation

## 2022-05-21 DIAGNOSIS — R251 Tremor, unspecified: Secondary | ICD-10-CM | POA: Insufficient documentation

## 2022-05-21 DIAGNOSIS — R569 Unspecified convulsions: Secondary | ICD-10-CM | POA: Insufficient documentation

## 2022-05-21 LAB — CBC WITH DIFFERENTIAL/PLATELET
Abs Immature Granulocytes: 0.01 10*3/uL (ref 0.00–0.07)
Basophils Absolute: 0 10*3/uL (ref 0.0–0.1)
Basophils Relative: 1 %
Eosinophils Absolute: 0.5 10*3/uL (ref 0.0–0.5)
Eosinophils Relative: 7 %
HCT: 36.4 % (ref 36.0–46.0)
Hemoglobin: 11.6 g/dL — ABNORMAL LOW (ref 12.0–15.0)
Immature Granulocytes: 0 %
Lymphocytes Relative: 40 %
Lymphs Abs: 3 10*3/uL (ref 0.7–4.0)
MCH: 27.6 pg (ref 26.0–34.0)
MCHC: 31.9 g/dL (ref 30.0–36.0)
MCV: 86.7 fL (ref 80.0–100.0)
Monocytes Absolute: 0.7 10*3/uL (ref 0.1–1.0)
Monocytes Relative: 10 %
Neutro Abs: 3.2 10*3/uL (ref 1.7–7.7)
Neutrophils Relative %: 42 %
Platelets: 337 10*3/uL (ref 150–400)
RBC: 4.2 MIL/uL (ref 3.87–5.11)
RDW: 12.8 % (ref 11.5–15.5)
WBC: 7.5 10*3/uL (ref 4.0–10.5)
nRBC: 0 % (ref 0.0–0.2)

## 2022-05-21 LAB — BASIC METABOLIC PANEL
Anion gap: 6 (ref 5–15)
BUN: 6 mg/dL (ref 6–20)
CO2: 24 mmol/L (ref 22–32)
Calcium: 8.8 mg/dL — ABNORMAL LOW (ref 8.9–10.3)
Chloride: 107 mmol/L (ref 98–111)
Creatinine, Ser: 0.76 mg/dL (ref 0.44–1.00)
GFR, Estimated: >60 mL/min (ref >60)
Glucose, Bld: 92 mg/dL (ref 70–99)
Potassium: 4.2 mmol/L (ref 3.5–5.1)
Sodium: 137 mmol/L (ref 135–145)

## 2022-05-21 MED ORDER — METOCLOPRAMIDE HCL 5 MG/ML IJ SOLN
10.0000 mg | Freq: Once | INTRAMUSCULAR | Status: AC
  Administered 2022-05-21: 10 mg via INTRAVENOUS
  Filled 2022-05-21: qty 2

## 2022-05-21 MED ORDER — TOPIRAMATE 50 MG PO TABS: ORAL_TABLET | ORAL | 0 refills | Status: DC

## 2022-05-21 MED ORDER — DIPHENHYDRAMINE HCL 50 MG/ML IJ SOLN
12.5000 mg | Freq: Once | INTRAMUSCULAR | Status: AC
  Administered 2022-05-21: 12.5 mg via INTRAVENOUS
  Filled 2022-05-21: qty 1

## 2022-05-21 MED ORDER — KETOROLAC TROMETHAMINE 30 MG/ML IJ SOLN
30.0000 mg | Freq: Once | INTRAMUSCULAR | Status: AC
  Administered 2022-05-21: 30 mg via INTRAVENOUS
  Filled 2022-05-21: qty 1

## 2022-05-21 MED ORDER — SODIUM CHLORIDE 0.9 % IV BOLUS
1000.0000 mL | Freq: Once | INTRAVENOUS | Status: AC
  Administered 2022-05-21: 1000 mL via INTRAVENOUS

## 2022-05-21 NOTE — ED Provider Notes (Signed)
[place] EMERGENCY DEPARTMENT AT [facility] Provider Note   CSN: [id number] Arrival date & time: 05/21/22  1451     History  Chief Complaint  Patient presents with   Seizures    Deborah Singleton is a 27 y.o. female with past medical history significant for seizure disorder presents to the ED complaining of "a little seizure".  She has a history of seizures and was not on medication.  Patient was seen in ED on Friday for seizure and was prescribed Keppra to begin taking again.  She reports today she has been having a "mild seizure" all day.  She describes it as "being out of it" and some shaking in her sleep last night.  She also has a migraine type headache with nausea.  She has not started taking any oral medications for seizures.  She reports being on Keppra in the past during pregnancy and it will make her feel dizzy.  Prior to pregnancy, patient was on Topamax for seizures.  Denies vomiting, abdominal pain, lightheadedness, numbness, syncope, weakness.         Home Medications Prior to Admission medications   Medication Sig Start Date End Date Taking? Authorizing Provider  topiramate (TOPAMAX) 50 MG tablet Take 0.5 tablets (25 mg total) by mouth 2 (two) times daily for 7 days, THEN 1 tablet (50 mg total) 2 (two) times daily for 7 days, THEN 1.5 tablets (75 mg total) 2 (two) times daily for 16 days. 05/21/22 06/20/22 Yes Krystall Kruckenberg R, PA-C  fexofenadine (ALLEGRA) 60 MG tablet Take 1 tablet (60 mg total) by mouth 2 (two) times daily. 05/07/22 06/06/22  Gerhard Munch, MD  ibuprofen (ADVIL) 600 MG tablet Take 1 tablet (600 mg total) by mouth every 6 (six) hours as needed for mild pain or moderate pain. 04/04/22   Gustavus Bryant, FNP  lidocaine (LIDODERM) 5 % Place 1 patch onto the skin daily. Remove & Discard patch within 12 hours or as directed by MD 04/04/22   Gustavus Bryant, FNP  nitrofurantoin, macrocrystal-monohydrate, (MACROBID) 100 MG capsule Take 1 capsule (100 mg total) by  mouth 2 (two) times daily. Patient not taking: Reported on 02/05/2022 02/09/21   Tomi Bamberger, PA-C  ondansetron (ZOFRAN-ODT) 4 MG disintegrating tablet Take 1 tablet (4 mg total) by mouth every 8 (eight) hours as needed for vomiting or nausea. 09/19/21   Gustavus Bryant, FNP  oxyCODONE (OXY IR/ROXICODONE) 5 MG immediate release tablet Take 1 tablet (5 mg total) by mouth every 6 (six) hours as needed for moderate pain, severe pain or breakthrough pain. Patient not taking: Reported on 09/02/2020 08/22/20   Nugent, Odie Sera, NP  polyethylene glycol powder (GLYCOLAX/MIRALAX) 17 GM/SCOOP powder Take 17 g by mouth daily as needed. 02/23/22   Venora Maples, MD  predniSONE (DELTASONE) 20 MG tablet Take 2 tablets (40 mg total) by mouth daily with breakfast. For the next four days 05/07/22   Gerhard Munch, MD  Prenatal Vit-Fe Fumarate-FA (PRENATAL MULTIVITAMIN) TABS tablet Take 1 tablet by mouth daily at 12 noon. Patient not taking: Reported on 09/30/2020    [provider]  senna-docusate (SENOKOT-S) 8.6-50 MG tablet Take 2 tablets by mouth daily. Patient not taking: Reported on 09/02/2020 08/23/20   Nugent, Odie Sera, NP      Allergies    Acetaminophen    Review of Systems   Review of Systems  Gastrointestinal:  Positive for nausea. Negative for abdominal pain and vomiting.  Musculoskeletal:  Positive for  myalgias (generalized).  Neurological:  Positive for dizziness, seizures and headaches. Negative for syncope, weakness, light-headedness and numbness.    Physical Exam Updated Vital Signs BP (!) 111/55   Pulse 83   Temp 98.2 F (36.8 C) (Oral)   Resp 16   Ht  (1.549 m)   Wt 104.3 kg   LMP 05/01/2022   SpO2 95%   BMI 43.45 kg/m  Physical Exam Vitals and nursing note reviewed.  Constitutional:      General: She is not in acute distress.    Appearance: She is not ill-appearing.  HENT:     Mouth/Throat:     Mouth: Mucous membranes are moist.     Pharynx: Oropharynx is  clear.  Eyes:     General: Lids are normal. Vision grossly intact.     Extraocular Movements: Extraocular movements intact.     Conjunctiva/sclera: Conjunctivae normal.     Pupils: Pupils are equal, round, and reactive to light.  Cardiovascular:     Rate and Rhythm: Normal rate and regular rhythm.     Pulses: Normal pulses.     Heart sounds: Normal heart sounds.  Pulmonary:     Effort: Pulmonary effort is normal. No respiratory distress.     Breath sounds: Normal breath sounds and air entry.  Abdominal:     General: Abdomen is flat. Bowel sounds are normal. There is no distension.     Palpations: Abdomen is soft.     Tenderness: There is no abdominal tenderness.  Skin:    General: Skin is warm and dry.     Capillary Refill: Capillary refill takes less than 2 seconds.  Neurological:     General: No focal deficit present.     Mental Status: She is alert and oriented to person, place, and time. Mental status is at baseline.     GCS: GCS eye subscore is 4. GCS verbal subscore is 5. GCS motor subscore is 6.     Cranial Nerves: Cranial nerves 2-12 are intact.     Sensory: Sensation is intact.     Motor: Motor function is intact.     Coordination: Coordination is intact.     Gait: Gait is intact.  Psychiatric:        Mood and Affect: Mood normal.        Behavior: Behavior normal.     ED Results / Procedures / Treatments   Labs (all labs ordered are listed, but only abnormal results are displayed) Labs Reviewed  BASIC METABOLIC PANEL - Abnormal; Notable for the following components:      Result Value   Calcium 8.8 (*)    All other components within normal limits  CBC WITH DIFFERENTIAL/PLATELET - Abnormal; Notable for the following components:   Hemoglobin 11.6 (*)    All other components within normal limits    EKG None  Radiology No results found.  Procedures Procedures    Medications Ordered in ED Medications  sodium chloride 0.9 % bolus 1,000 mL (1,000 mLs  Intravenous New Bag/Given 05/21/22 1848)  ketorolac (TORADOL) 30 MG/ML injection 30 mg (30 mg Intravenous Given 05/21/22 1849)  metoCLOPramide (REGLAN) injection 10 mg (10 mg Intravenous Given 05/21/22 1849)  diphenhydrAMINE (BENADRYL) injection 12.5 mg (12.5 mg Intravenous Given 05/21/22 1848)    ED Course/ Medical Decision Making/ A&P                             Medical Decision  Making  This patient presents to the ED with chief complaint(s) of right sided headache with associated nausea, and seizures with pertinent past medical history of seizures.  The complaint involves an extensive differential diagnosis and also carries with it a high risk of complications and morbidity.    The differential diagnosis includes epileptic seizures, partial seizures, migraine headache, tension headache, electrolyte disturbance, toxic ingestion.  This list is not exhaustive.    The initial plan is to obtain baseline labs, treat migraine-like headache  Additional history obtained: Records reviewed  - patient last saw neurology in 2021.  She was switched from Topamax to Keppra due to pregnancy.  Patient reports Keppra makes her dizzy and feel "out of it".  She was on 75 mg BID Topamax prior to switching to Keppra.   Initial Assessment:   On exam, patient is resting comfortably in bed and does not appear to be in acute distress. Neuro exam is unremarkable.  She is moving all extremities appropriately.  Sensation is intact.  EOM intact, PERRLA.  She is normocephalic, atraumatic.  Skin warm and dry.  Heart rate normal in the 70s with regular rhythm.  Pulmonary effort normal.   Independent ECG/labs interpretation:  The following labs were independently interpreted:  CBC without leukocytosis.  Mild anemia present.  CBC with mild hypocalcemia, no other electrolyte disturbance.  Kidney function is normal.    Treatment and Reassessment: Patient with migraine headache symptoms.  Will give cocktail of IV fluids,  Toradol, Reglan, and Benadryl.  Patient reports her symptoms have improved following treatment.  She does not wish to complete the IV fluids and is requesting discharge.  I feel that patient is appropriate for discharge at this time.     Disposition:   Will refer patient to outpatient neurology with [facility].  Discussed with patient importance of follow-up.  Will restart patient on Topamax.  She reports she had tubal ligation performed after her last pregnancy.  Pregnancy test from 2 days prior negative.  Patient understands the risk of birth defects with this medication should she become pregnant.    The patient has been appropriately medically screened and/or stabilized in the ED. I have low suspicion for any other emergent medical condition which would require further screening, evaluation or treatment in the ED or require inpatient management. At time of discharge the patient is hemodynamically stable and in no acute distress. I have discussed work-up results and diagnosis with patient and answered all questions. Patient is agreeable with discharge plan. We discussed strict return precautions for returning to the emergency department and they verbalized understanding.            Final Clinical Impression(s) / ED Diagnoses Final diagnoses:  Seizure  Bad headache    Rx / DC Orders ED Discharge Orders          Ordered    Ambulatory referral to Neurology       Comments: An appointment is requested in approximately: 2 weeks   05/21/22 1815    topiramate (TOPAMAX) 50 MG tablet  BID        05/21/22 1949              Lenard Simmer, PA-C 05/21/22 1950    Maia Plan, MD 05/23/22 [id number]

## 2022-05-21 NOTE — ED Triage Notes (Signed)
The pt reports that she had a seizure Friday  she was seen in this ed  she does not take medicine for seizures   lmp April 1st

## 2022-05-21 NOTE — ED Provider Triage Note (Signed)
Emergency Medicine Provider Triage Evaluation Note  Deborah Singleton , a 27 y.o. female  was evaluated in triage.  Patient complains of "a little seizure."  She reports that she has a history of seizures and was seen on Friday for this.  She says she has been having a mild seizure all day.  Describes it as "being out of it" and some shaking in her sleep last night also complaining of a headache  Review of Systems  Positive:  Negative:   Physical Exam  BP 125/76 (BP Location: Right Arm)   Pulse 78   Temp 97.9 F (36.6 C) (Oral)   Resp 18   Ht  (1.549 m)   Wt 104.3 kg   LMP 05/01/2022   SpO2 99%   BMI 43.45 kg/m  Gen:   Awake, no distress   Resp:  Normal effort  MSK:   Moves extremities without difficulty  Other:  Alert and oriented.  No seizure-like activity  Medical Decision Making  Medically screening exam initiated at 3:36 PM.  Appropriate orders placed.  Christyann Reicher was informed that the remainder of the evaluation will be completed by another provider, this initial triage assessment does not replace that evaluation, and the importance of remaining in the ED until their evaluation is complete.     Saddie Benders, PA-C 05/21/22 1536

## 2022-05-21 NOTE — Discharge Instructions (Addendum)
Thank you for allowing me to be a part of your care today.  You received medications in the ER that are used to treat migraine headaches.  If your headache returns, I recommend taking over the counter medications such as Tylenol and/or ibuprofen and following up with neurology.   Please call [facility] to schedule a follow-up appointment as soon as possible.  I have sent over a prescription for Topamax to your pharmacy.  You will have to slowly increase the dose.  Follow the instructions on the prescription.    Return to the ED if you experience worsening of your symptoms or if you have any new concerns.

## 2022-05-22 ENCOUNTER — Telehealth: Payer: Self-pay | Admitting: Licensed Clinical Social Worker

## 2022-05-22 LAB — LEVETIRACETAM LEVEL: Levetiracetam Lvl: <2 ug/mL — ABNORMAL LOW (ref 10.0–40.0)

## 2022-05-22 NOTE — Transitions of Care (Post Inpatient/ED Visit) (Signed)
   05/22/2022  Name: Deborah Singleton MRN: [id number] DOB: 04/21/1995  Today's TOC FU Call Status: Today's TOC FU Call Status:: Unsuccessul Call (1st Attempt) Unsuccessful Call (1st Attempt) Date: 05/22/22  Attempted to reach the patient regarding the most recent Inpatient/ED visit.  Follow Up Plan: Additional outreach attempts will be made to reach the patient to complete the Transitions of Care (Post Inpatient/ED visit) call.   Dickie La, BSW, MSW, [employer] LCSW [facility]  [facility] [email or web address] Phone: [phone]

## 2022-05-29 ENCOUNTER — Ambulatory Visit (INDEPENDENT_AMBULATORY_CARE_PROVIDER_SITE_OTHER): Payer: Medicaid Other | Admitting: Neurology

## 2022-05-29 ENCOUNTER — Encounter: Payer: Self-pay | Admitting: Neurology

## 2022-05-29 VITALS — BP 115/53 | HR 91 | Ht 61.0 in | Wt 241.0 lb

## 2022-05-29 DIAGNOSIS — G40309 Generalized idiopathic epilepsy and epileptic syndromes, not intractable, without status epilepticus: Secondary | ICD-10-CM

## 2022-05-29 DIAGNOSIS — G43009 Migraine without aura, not intractable, without status migrainosus: Secondary | ICD-10-CM | POA: Diagnosis not present

## 2022-05-29 MED ORDER — TOPIRAMATE 50 MG PO TABS: ORAL_TABLET | ORAL | 3 refills | Status: AC

## 2022-05-29 MED ORDER — SUMATRIPTAN SUCCINATE 50 MG PO TABS: ORAL_TABLET | ORAL | 11 refills | Status: AC

## 2022-05-29 NOTE — Progress Notes (Signed)
NEUROLOGY FOLLOW UP OFFICE NOTE  Deborah Singleton [id number] 13-Jan-1996  HISTORY OF PRESENT ILLNESS: I had the pleasure of seeing Deborah Singleton in follow-up in the neurology clinic on 05/28/24.  The patient was last seen over 2 year ago for seizures and migraines. She was pregnant on her last visit in 2022 and had stopped Topiramate during pregnancy, started on Levetiracetam. Records and images were reviewed. She was lost to follow-up and states she stopped Levetiracetam after she delivered her baby. She also reports that she was not taking seizure medication in the past and would go seizure-free for years. She was seizure-free from 2021 until April. On 05/19/2022, she felt herself going in and out, like she was nodding off but not falling asleep. She kept having it throughout the day, she would talk but words would be slurred, she would be looking at family but not hearing them. She recalls laying on the couch then waking up on her bed with EMS around her. She bit the left side of her tongue. She had a little urinary incontinence. She recalls taht 2 weeks prior, she was staring off feeling like she was mentally not there. She was discharged home on Levetiracetam but did not take it, then went back to the ER on 4/21 for a "mild seizure" all day, being out of it, and shaking in her sleep the night prior. She was also having a migraine with nausea. Topiramate was restarted. She has been taking Topiramate 75mg  BID for the past 3-4 days without side effects.   She notes that she tasted pennies in her mouth before this recent seizure. She has body twitches at random times. No focal numbness/tingling/weakness. She has a lot of migraines occurring around 2-3 times a week. They can last 2-3 days. She is sensitive to lights and sounds, no nausea/vomiting. She takes Ibuprofen. She has been having pain behind both eyes recently, no diplopia, dysarthria/dysphagia, bowel dysfunction. She had bronchitis the week prior to  the seizure and was given Prednisone. She notes stress incontinence with coughing since then. She lives with her 3 children but since the seizure, she has been staying with her mother. She works 3pm-11pm doing PCA/CNA work. She usually gets 5 hours of sleep. No alcohol. No pregnancy plans, she has had a tubal ligation.    History on Initial Assessment 04/24/2018: This is a 27 year old right-handed woman with a history of seizures since age 66 presenting to establish care. She was previously seeing epileptologist Dr. Sherlean Foot at [facility], records were reviewed and will be summarized below. Seizures started at age 83 soon after her son was born. She recalls feeling lightheaded and "going in and out" then had a seizure in the car. She reports body jerks where she would drop things. She had an EEG at [facility] in 08/2014 with interictal epileptiform discharges consisting of polyspikes which were not clearly lateralizing, they are frequently seen on the left side but also occasionally on the right hemisphere. There were also polyspikes that were generalized. MRI brain with and without contrast in 01/2014 reported a single T2 hyperintensity in the left frontal region otherwise normal, MRA normal. She was previously on Levetiracetam and Lamotrigine, currently on Topiramate 50mg  BID. Her last visit at [facility] was in 2018, at that time she was started on Levetiracetam. She was in the ER in 09/2017 where she was prescribed the Topiramate, then lost her insurance and was back in the ER for a seizure on 02/17/2018 and Topiramate was restarted.  She reports seizures every 1-2 months. She has bitten her tongue a few times, no post-ictal focal weakness. She would lose her memory after seizures. She lives with her children's father who has mentioned staring spells, but not that often. Seizure triggers are alcohol and "being around people who smoke and drink." She has headaches if she does not get a lot of sleep or moves around a lot. No  olfactory/gustatory hallucinations, dizziness, diplopia, dysarthria/dysphagia, neck/back pain, focal numbness/tingling/weakness, bowel/bladder dysfunction. Sleep is good, sometimes she tosses and turns. She is not driving. She is currently unemployed. No pregnancy plans, she is sexually active and currently not on contraception. She felt the implant and Depo shots were causing weight gain and more seizures.   Diagnostic Data: EEG at [facility] in 08/2014 reported interictal epileptiform discharges consisting of polyspikes which were not clearly lateralizing, they are frequently seen on the left side but also occasionally on the right hemisphere. There were also polyspikes that were generalized.   MRI brain with and without contrast in 01/2014 reported a single T2 hyperintensity in the left frontal region otherwise normal, MRA normal.  Prior ASMs: Levetiracetam, Lamotrigine, Topiramate   PAST MEDICAL HISTORY: Past Medical History:  Diagnosis Date   Miscarriage 06/02/2019   Seizures (HCC)    last seizure Aug 2021    MEDICATIONS: Current Outpatient Medications on File Prior to Visit  Medication Sig Dispense Refill   fexofenadine (ALLEGRA) 60 MG tablet Take 1 tablet (60 mg total) by mouth 2 (two) times daily. 60 tablet 0   ibuprofen (ADVIL) 600 MG tablet Take 1 tablet (600 mg total) by mouth every 6 (six) hours as needed for mild pain or moderate pain. 30 tablet 0   topiramate (TOPAMAX) 50 MG tablet Take 0.5 tablets (25 mg total) by mouth 2 (two) times daily for 7 days, THEN 1 tablet (50 mg total) 2 (two) times daily for 7 days, THEN 1.5 tablets (75 mg total) 2 (two) times daily for 16 days. 69 tablet 0   No current facility-administered medications on file prior to visit.    ALLERGIES: Allergies  Allergen Reactions   Acetaminophen Itching    Patient states "whatever I was given when I went home after my last baby had tylenol and I had black spots over my legs."    FAMILY HISTORY: Family  History  Problem Relation Age of Onset   Asthma Mother    Seizures Mother    Diabetes Father    Depression Sister    Diabetes Sister        younger sister   Cancer Maternal Aunt        on dad's side   Cancer Maternal Uncle        on dad's side    SOCIAL HISTORY: Social History   Socioeconomic History   Marital status: Single    Spouse name: Not on file   Number of children: 2   Years of education: Not on file   Highest education level: High school graduate  Occupational History   Not on file  Tobacco Use   Smoking status: Never   Smokeless tobacco: Never  Vaping Use   Vaping Use: Never used  Substance and Sexual Activity   Alcohol use: Not Currently   Drug use: No   Sexual activity: Yes    Birth control/protection: Surgical  Other Topics Concern   Not on file  Social History Narrative   In [place], lives with children   Right handed   One  story home   Drinks no caffeine   Social Determinants of Health   Financial Resource Strain: Not on file  Food Insecurity: No Food Insecurity (05/09/2022)   Hunger Vital Sign    Worried About Running Out of Food in the Last Year: Never true    Ran Out of Food in the Last Year: Never true  Transportation Needs: No Transportation Needs (05/09/2022)   PRAPARE - Administrator, Civil Service (Medical): No    Lack of Transportation (Non-Medical): No  Physical Activity: Not on file  Stress: Not on file  Social Connections: Not on file  Intimate Partner Violence: Not At Risk (05/09/2022)   Humiliation, Afraid, Rape, and Kick questionnaire    Fear of Current or Ex-Partner: No    Emotionally Abused: No    Physically Abused: No    Sexually Abused: No     PHYSICAL EXAM: Vitals:   05/29/22 1019  BP: (!) 115/53  Pulse: 91  SpO2: 96%   General: No acute distress Head:  Normocephalic/atraumatic Skin/Extremities: No rash, no edema Neurological Exam: alert and awake. No aphasia or dysarthria. Fund of knowledge is  appropriate. Attention and concentration are normal.   Cranial nerves: Pupils equal, round. Extraocular movements intact with no nystagmus. Visual fields full.  No facial asymmetry.  Motor: Bulk and tone normal, muscle strength 5/5 throughout with no pronator drift. Reflexes +1 throughout. Finger to nose testing intact.  Gait narrow-based and steady, able to tandem walk adequately.     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 She would go seizure-free for prolonged periods, sometimes off seizure medication, however we discussed that at this point, long-term seizure medication is recommended. She did well on Topiramate 75mg  BID for seizure and migraine prophylaxis in the past, refills sent. She was given a prescription for Imitrex 50mg  as needed for migraine rescue, side effects discussed. She was advised to keep a calendar of her seizures (stare offs) and migraines. She is aware of [place] driving laws to stop driving after a seizure until 6 months seizure-free. Follow-up in 3 months, call for any changes.   Thank you for allowing me to participate in her care.  Please do not hesitate to call for any questions or concerns.    Patrcia Dolly, M.D.

## 2022-05-29 NOTE — Patient Instructions (Signed)
Good to see you.  Continue Topiramate 50mg : take 1 and 1/2 tablets twice a day  2. A prescription for Sumatriptan (Imitrex) has been sent for migraine rescue. Take 1 tablet at onset of migraine. Do not take more than 3 a week.  3. Keep a calendar of your symptoms (stare off, migraines) as Topamax gets into your system. Let me know if still having a lot of start offs, we will do an EEG at that point  4. Follow-up in 3 months, call for any changes.   Seizure Precautions: 1. If medication has been prescribed for you to prevent seizures, take it exactly as directed.  Do not stop taking the medicine without talking to your doctor first, even if you have not had a seizure in a long time.   2. Avoid activities in which a seizure would cause danger to yourself or to others.  Don't operate dangerous machinery, swim alone, or climb in high or dangerous places, such as on ladders, roofs, or girders.  Do not drive unless your doctor says you may.  3. If you have any warning that you may have a seizure, lay down in a safe place where you can't hurt yourself.    4.  No driving for 6 months from last seizure, as per [facility].   Please refer to the following link on the Epile[employer] Foundation of America's website for more information: http://www.epilepsyfoundation.org/answerplace/Social/driving/drivingu.cfm   5.  Maintain good sleep hygiene. Avoid alcohol.  6.  Contact your doctor if you have any problems that may be related to the medicine you are taking.  7.  Call 911 and bring the patient back to the ED if:        A.  The seizure lasts longer than 5 minutes.       B.  The patient doesn't awaken shortly after the seizure  C.  The patient has new problems such as difficulty seeing, speaking or moving  D.  The patient was injured during the seizure  E.  The patient has a temperature over 102 F (39C)  F.  The patient vomited and now is having trouble breathing

## 2022-06-03 IMAGING — US US PELVIS COMPLETE TRANSABD/TRANSVAG W DUPLEX AND/OR DOPPLER
1 series · 13 of 25 positions shown · non-contrast
Comparison: No recent comparison

CLINICAL DATA: A 25-year-old female presents with pelvic pain

EXAM:
TRANSABDOMINAL AND TRANSVAGINAL ULTRASOUND OF PELVIS
DOPPLER ULTRASOUND OF OVARIES
TECHNIQUE: Both transabdominal and transvaginal ultrasound examinations of the
pelvis were performed. Transabdominal technique was performed for
global imaging of the pelvis including uterus, ovaries, adnexal
regions, and pelvic cul-de-sac.
It was necessary to proceed with endovaginal exam following the
transabdominal exam to visualize the endometrium. Color and duplex
Doppler ultrasound was utilized to evaluate blood flow to the
ovaries.

[Series 1: us pelvis (transabdominal only) · 102 acquisitions, 13 frames shown]
[im 1/102]
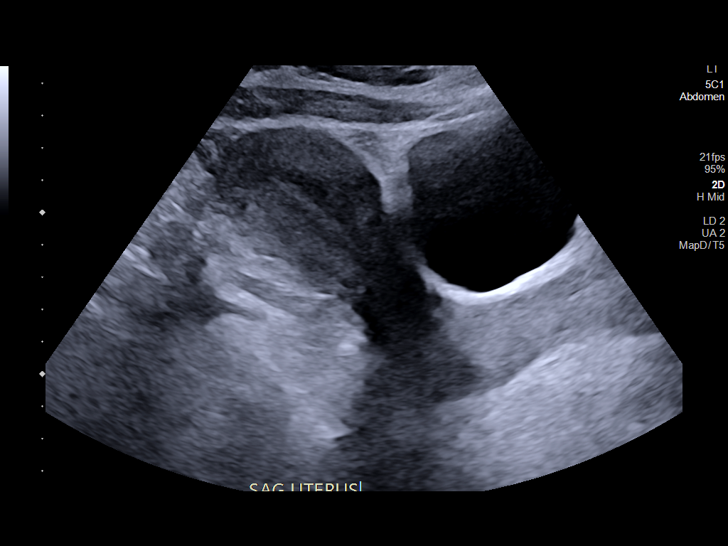
[im 9/102]
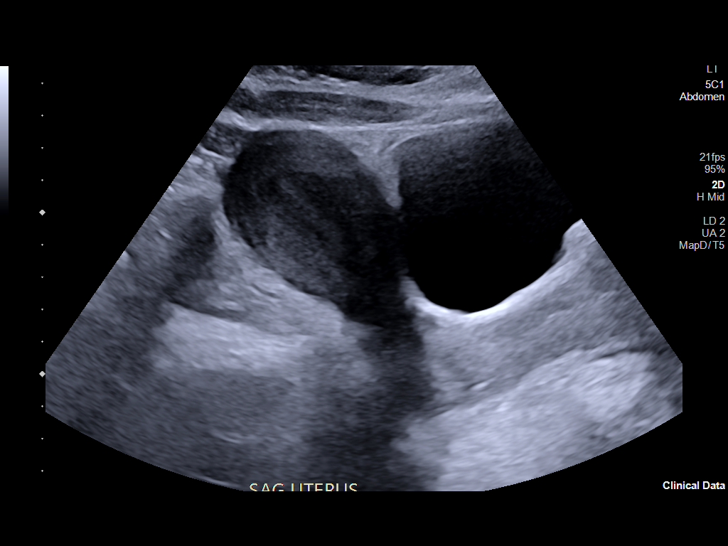
[im 17/102]
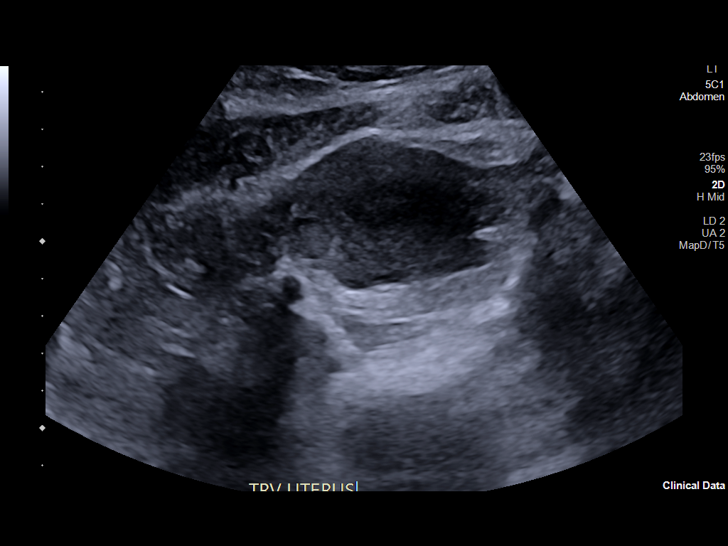
[im 26/102]
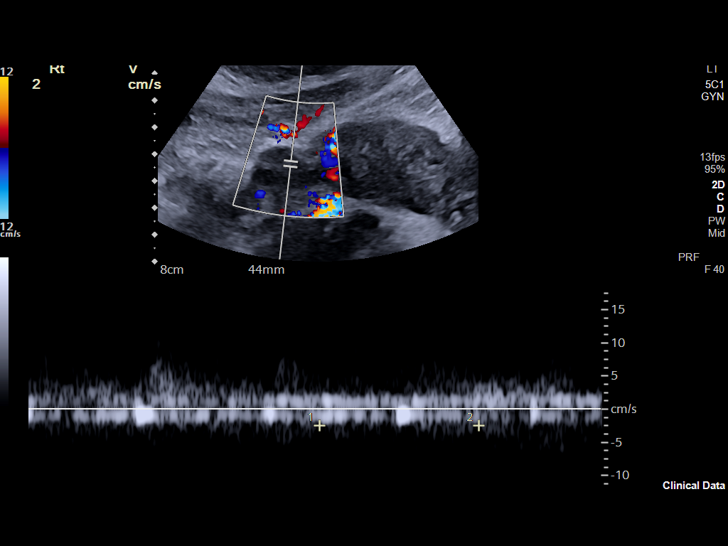
[im 34/102]
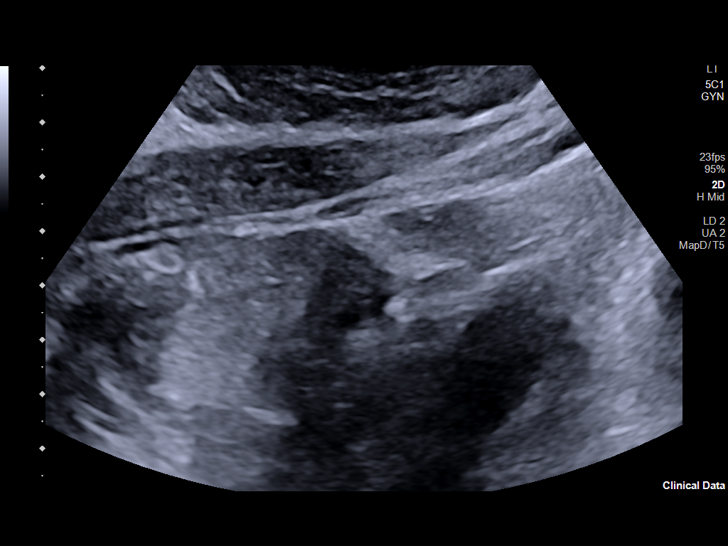
[im 43/102]
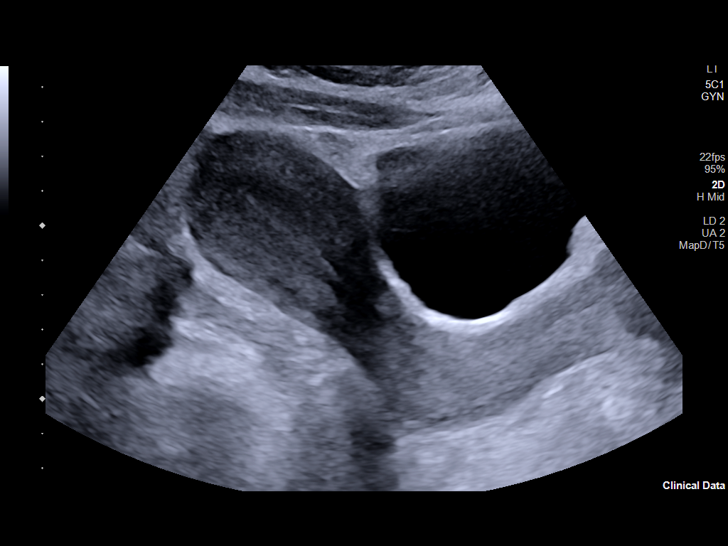
[im 51/102]
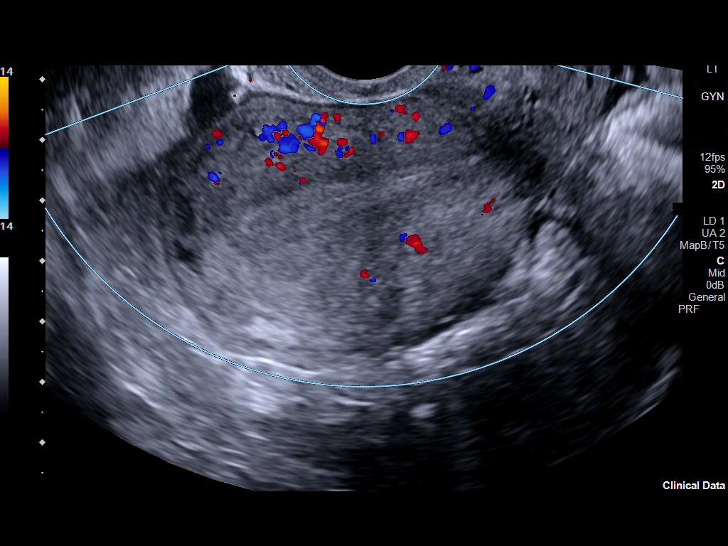
[im 59/102]
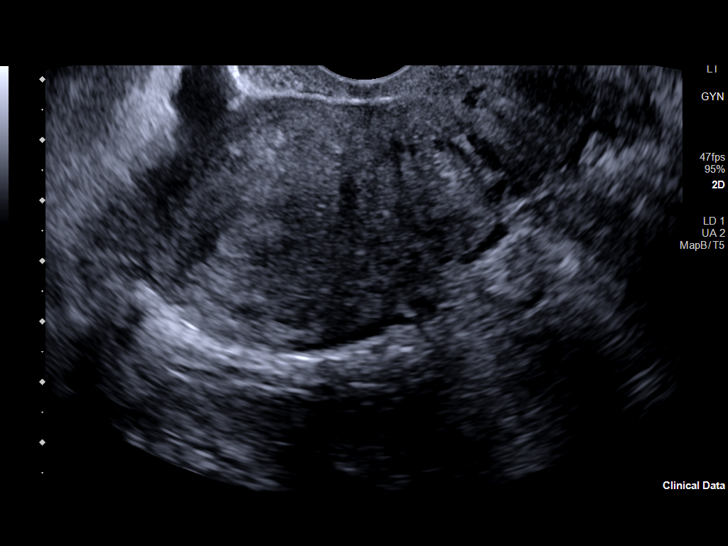
[im 68/102]
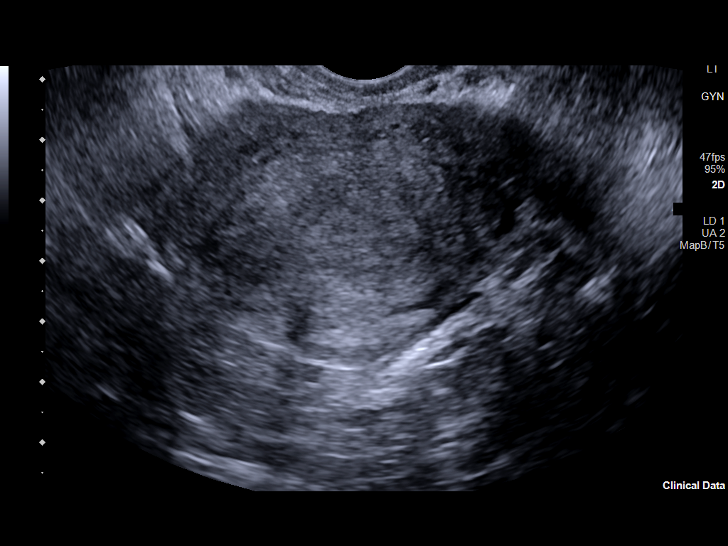
[im 76/102]
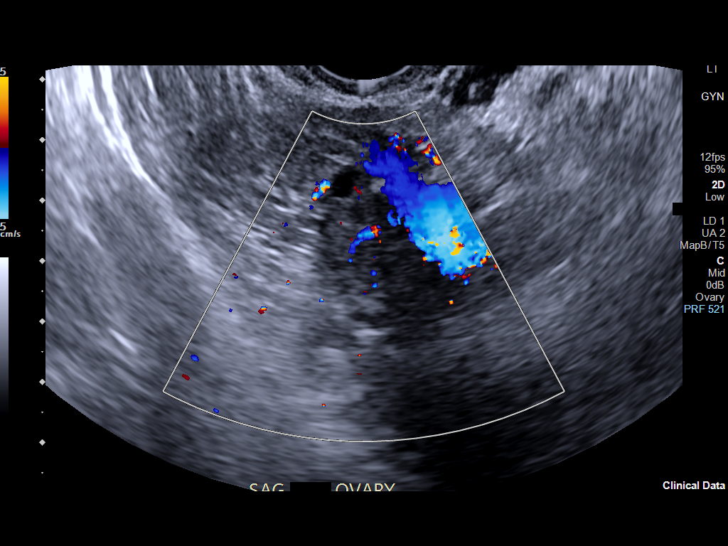
[im 85/102]
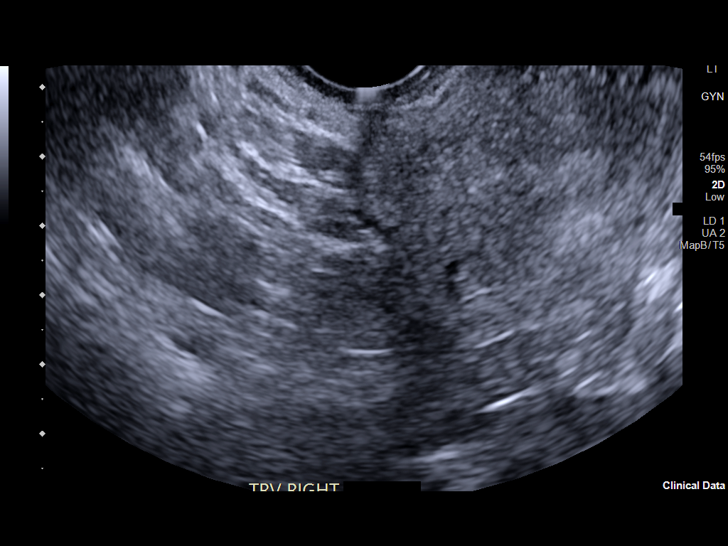
[im 93/102]
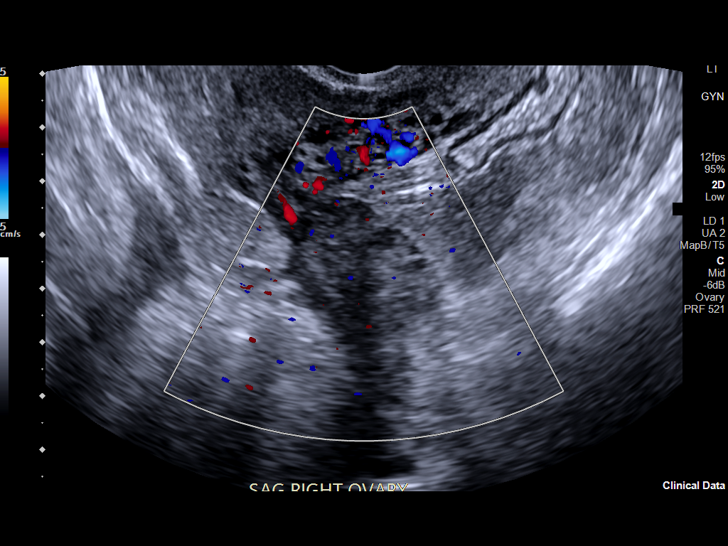
[im 102/102]
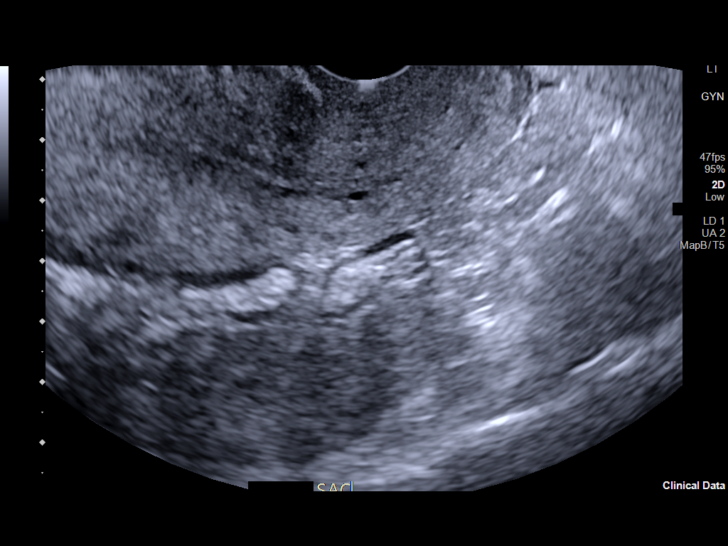

[13 of 25 positions shown; findings below may reference images not displayed]

FINDINGS: Uterus

Measurements: 10.7 x 4.1 x 5.4 cm = volume: 126 mL. No fibroids or
other mass visualized.

Endometrium

Thickness: 5.8 mm.  No focal abnormality visualized.

Right ovary

Measurements: 2.9 x 1.9 x 2.6 cm = volume: 7.6 mL. Normal
appearance/no adnexal mass.

Left ovary

Measurements: 2.6 x 1.8 x 3.1 cm = volume: 7.6 mL. Normal
appearance/no adnexal mass.

Pulsed Doppler evaluation of both ovaries demonstrates normal
low-resistance arterial and venous waveforms.

Other findings

No abnormal free fluid.
IMPRESSION: Normal pelvic sonogram.  No sonographic evidence of ovarian torsion

## 2022-06-04 ENCOUNTER — Ambulatory Visit
Admission: EM | Admit: 2022-06-04 | Discharge: 2022-06-04 | Discharge status: Against Medical Advice | Payer: Medicaid Other

## 2022-06-04 ENCOUNTER — Emergency Department (HOSPITAL_COMMUNITY): Payer: Medicaid Other

## 2022-06-04 ENCOUNTER — Emergency Department (HOSPITAL_COMMUNITY)
Admission: EM | Admit: 2022-06-04 | Discharge: 2022-06-04 | Disposition: A | Discharge status: Home / Self Care | Payer: Medicaid Other | Attending: Emergency Medicine | Admitting: Emergency Medicine

## 2022-06-04 ENCOUNTER — Encounter (HOSPITAL_COMMUNITY): Payer: Self-pay | Admitting: Emergency Medicine

## 2022-06-04 DIAGNOSIS — D649 Anemia, unspecified: Secondary | ICD-10-CM | POA: Diagnosis not present

## 2022-06-04 DIAGNOSIS — R000 Tachycardia, unspecified: Secondary | ICD-10-CM | POA: Diagnosis not present

## 2022-06-04 DIAGNOSIS — R058 Other specified cough: Secondary | ICD-10-CM | POA: Diagnosis not present

## 2022-06-04 DIAGNOSIS — R059 Cough, unspecified: Secondary | ICD-10-CM | POA: Diagnosis not present

## 2022-06-04 DIAGNOSIS — R0602 Shortness of breath: Secondary | ICD-10-CM | POA: Insufficient documentation

## 2022-06-04 DIAGNOSIS — R053 Chronic cough: Secondary | ICD-10-CM

## 2022-06-04 DIAGNOSIS — G40909 Epilepsy, unspecified, not intractable, without status epilepticus: Secondary | ICD-10-CM | POA: Insufficient documentation

## 2022-06-04 LAB — CBC WITH DIFFERENTIAL/PLATELET
Abs Immature Granulocytes: 0.02 10*3/uL (ref 0.00–0.07)
Basophils Absolute: 0 10*3/uL (ref 0.0–0.1)
Basophils Relative: 0 %
Eosinophils Absolute: 0.5 10*3/uL (ref 0.0–0.5)
Eosinophils Relative: 5 %
HCT: 34.5 % — ABNORMAL LOW (ref 36.0–46.0)
Hemoglobin: 11.1 g/dL — ABNORMAL LOW (ref 12.0–15.0)
Immature Granulocytes: 0 %
Lymphocytes Relative: 29 %
Lymphs Abs: 2.7 10*3/uL (ref 0.7–4.0)
MCH: 27.7 pg (ref 26.0–34.0)
MCHC: 32.2 g/dL (ref 30.0–36.0)
MCV: 86 fL (ref 80.0–100.0)
Monocytes Absolute: 1.3 10*3/uL — ABNORMAL HIGH (ref 0.1–1.0)
Monocytes Relative: 13 %
Neutro Abs: 4.9 10*3/uL (ref 1.7–7.7)
Neutrophils Relative %: 53 %
Platelets: 316 10*3/uL (ref 150–400)
RBC: 4.01 MIL/uL (ref 3.87–5.11)
RDW: 13.3 % (ref 11.5–15.5)
WBC: 9.4 10*3/uL (ref 4.0–10.5)
nRBC: 0 % (ref 0.0–0.2)

## 2022-06-04 LAB — BASIC METABOLIC PANEL
Anion gap: 7 (ref 5–15)
BUN: 7 mg/dL (ref 6–20)
CO2: 21 mmol/L — ABNORMAL LOW (ref 22–32)
Calcium: 8.7 mg/dL — ABNORMAL LOW (ref 8.9–10.3)
Chloride: 108 mmol/L (ref 98–111)
Creatinine, Ser: 0.84 mg/dL (ref 0.44–1.00)
GFR, Estimated: >60 mL/min (ref >60)
Glucose, Bld: 107 mg/dL — ABNORMAL HIGH (ref 70–99)
Potassium: 3.5 mmol/L (ref 3.5–5.1)
Sodium: 136 mmol/L (ref 135–145)

## 2022-06-04 LAB — D-DIMER, QUANTITATIVE: D-Dimer, Quant: <0.27 ug/mL-FEU (ref 0.00–0.50)

## 2022-06-04 LAB — I-STAT BETA HCG BLOOD, ED (MC, WL, AP ONLY): I-stat hCG, quantitative: <5 m[IU]/mL (ref <5)

## 2022-06-04 MED ORDER — LORATADINE 10 MG PO TABS
10.0000 mg | ORAL_TABLET | Freq: Once | ORAL | Status: AC
  Administered 2022-06-04: 10 mg via ORAL
  Filled 2022-06-04: qty 1

## 2022-06-04 MED ORDER — HYDROCODONE BIT-HOMATROP MBR 5-1.5 MG/5ML PO SOLN
5.0000 mL | Freq: Once | ORAL | Status: AC
  Administered 2022-06-04: 5 mL via ORAL
  Filled 2022-06-04: qty 5

## 2022-06-04 MED ORDER — BENZONATATE 100 MG PO CAPS
100.0000 mg | ORAL_CAPSULE | Freq: Once | ORAL | Status: AC
  Administered 2022-06-04: 100 mg via ORAL
  Filled 2022-06-04: qty 1

## 2022-06-04 MED ORDER — HYDROCODONE BIT-HOMATROP MBR 5-1.5 MG/5ML PO SOLN: 5.0000 mL | Freq: Four times a day (QID) | ORAL | 0 refills | Status: AC | PRN

## 2022-06-04 MED ORDER — ALBUTEROL SULFATE (2.5 MG/3ML) 0.083% IN NEBU
2.5000 mg | INHALATION_SOLUTION | Freq: Once | RESPIRATORY_TRACT | Status: AC
  Administered 2022-06-04: 2.5 mg via RESPIRATORY_TRACT
  Filled 2022-06-04: qty 3

## 2022-06-04 MED ORDER — ALBUTEROL SULFATE HFA 108 (90 BASE) MCG/ACT IN AERS
2.0000 | INHALATION_SPRAY | Freq: Once | RESPIRATORY_TRACT | Status: AC
  Administered 2022-06-04: 2 via RESPIRATORY_TRACT
  Filled 2022-06-04: qty 6.7

## 2022-06-04 NOTE — ED Triage Notes (Signed)
Called patient from waiting area (x1) 0944, No response. B. Roten CMA

## 2022-06-04 NOTE — Discharge Instructions (Addendum)
Your evaluation in the ED today was reassuring.  Use an albuterol inhaler every 4-6-hours for management of ongoing cough, shortness of breath. Use Hycodan for cough management. Do not drive or drink alcohol after taking this medication as it may make you drowsy and impair your judgment.  Follow-up with a primary care doctor for further evaluation of ongoing symptoms.  Return if symptoms persist or worsen.

## 2022-06-04 NOTE — ED Provider Notes (Signed)
[place] EMERGENCY DEPARTMENT AT [facility] Provider Note   CSN: [id number] Arrival date & time: 06/04/22  1851     History {Add pertinent medical, surgical, social history, OB history to HPI:1} Chief Complaint  Patient presents with   Cough    Deborah Singleton is a 27 y.o. female.  27 year old female with a history of epilepsy presents to the emergency department for evaluation of ongoing cough.  She has been experiencing this cough for the past month.  Completed a course of prednisone for symptoms without change.  Was also prescribed Allegra, but was not able to obtain this prescription due to the need for prior authorization.  She has been using her mother's albuterol inhaler and nebulizer sporadically when she feels her shortness of breath is worse.  She notes cough productive of mucus which is sometimes streaked with bright red blood.  She has not had any fevers.  Does note some ongoing nasal congestion.  No known sick contacts.   Cough      Home Medications Prior to Admission medications   Medication Sig Start Date End Date Taking? Authorizing Provider  fexofenadine (ALLEGRA) 60 MG tablet Take 1 tablet (60 mg total) by mouth 2 (two) times daily. 05/07/22 06/06/22  Gerhard Munch, MD  ibuprofen (ADVIL) 600 MG tablet Take 1 tablet (600 mg total) by mouth every 6 (six) hours as needed for mild pain or moderate pain. 04/04/22   Gustavus Bryant, FNP  SUMAtriptan (IMITREX) 50 MG tablet Take 1 tablet at onset of migraine. May repeat in 2 hours if headache persists or recurs. Do not take more than 3 a week 05/29/22   Van Clines, MD  topiramate (TOPAMAX) 50 MG tablet Take 1 and 1/2 tablets twice a day 05/29/22   Van Clines, MD      Allergies    Acetaminophen    Review of Systems   Review of Systems  Respiratory:  Positive for cough.   Ten systems reviewed and are negative for acute change, except as noted in the HPI.    Physical Exam Updated Vital Signs BP (!)  150/125 (BP Location: Right Arm)   Pulse (!) 116   Temp 98.3 F (36.8 C) (Oral)   Resp (!) 22   LMP 05/01/2022 (Exact Date)   SpO2 100%   Physical Exam Vitals and nursing note reviewed.  Constitutional:      General: She is not in acute distress.    Appearance: She is well-developed. She is not diaphoretic.     Comments: Nontoxic appearing, obese AA female  HENT:     Head: Normocephalic and atraumatic.     Nose: Congestion present. No rhinorrhea.  Eyes:     General: No scleral icterus.    Conjunctiva/sclera: Conjunctivae normal.  Cardiovascular:     Rate and Rhythm: Regular rhythm. Tachycardia present.     Pulses: Normal pulses.     Comments: Mild tachycardia Pulmonary:     Effort: Pulmonary effort is normal. No respiratory distress.     Breath sounds: No stridor. No wheezing.     Comments: Lungs clear to auscultation bilaterally.  No tachypnea, dyspnea. Musculoskeletal:        General: Normal range of motion.     Cervical back: Normal range of motion.  Skin:    General: Skin is warm and dry.     Coloration: Skin is not pale.     Findings: No erythema or rash.  Neurological:     Mental  Status: She is alert and oriented to person, place, and time.     Coordination: Coordination normal.  Psychiatric:        Behavior: Behavior normal.     ED Results / Procedures / Treatments   Labs (all labs ordered are listed, but only abnormal results are displayed) Labs Reviewed  D-DIMER, QUANTITATIVE  CBC WITH DIFFERENTIAL/PLATELET  BASIC METABOLIC PANEL  I-STAT BETA HCG BLOOD, ED ([facility], [facility], [facility] ONLY)    EKG None  Radiology DG Chest 2 View  Result Date: 06/04/2022 CLINICAL DATA:  cough Elsie Saas EXAM: CHEST - 2 VIEW COMPARISON:  May 07, 2022 FINDINGS: The cardiomediastinal silhouette is normal in contour. No pleural effusion. No pneumothorax. No acute pleuroparenchymal abnormality. Visualized abdomen is unremarkable. No acute osseous abnormality noted. IMPRESSION: No acute  cardiopulmonary abnormality. Electronically Signed   By: Meda Klinefelter M.D.   On: 06/04/2022 19:54    Procedures Procedures  {Document cardiac monitor, telemetry assessment procedure when appropriate:1}  Medications Ordered in ED Medications  HYDROcodone bit-homatropine (HYCODAN) 5-1.5 MG/5ML syrup 5 mL (has no administration in time range)  benzonatate (TESSALON) capsule 100 mg (has no administration in time range)  loratadine (CLARITIN) tablet 10 mg (has no administration in time range)  albuterol (VENTOLIN HFA) 108 (90 Base) MCG/ACT inhaler 2 puff (has no administration in time range)  albuterol (PROVENTIL) (2.5 MG/3ML) 0.083% nebulizer solution 2.5 mg (2.5 mg Nebulization Given 06/04/22 1928)    ED Course/ Medical Decision Making/ A&P Clinical Course as of 06/04/22 2232  Sun Jun 04, 2022  2231 Discussed plan of blood work with patient.  Given that she was tachycardic as well as tachypneic on arrival with reports of some hemoptysis with ongoing cough, shortness of breath, feel that D-dimer is warranted to assess for probability of pulmonary embolus.  Her chest x-ray has been reviewed and appears reassuring without evidence of pneumonia, pneumothorax, pleural effusion.  Her lungs are grossly clear on exam and she has no overt signs of respiratory distress.  Oxygenating well throughout ED course.  Initially, patient was not open to completing blood work, but then changed her mind.  I have explained to the patient that she would be signing out AGAINST MEDICAL ADVICE should she not remain in the ED for the completion of her evaluation. [KH]    Clinical Course User Index [KH] Antony Madura, PA-C   {   Click here for ABCD2, HEART and other calculatorsREFRESH Note before signing :1}                          Medical Decision Making Amount and/or Complexity of Data Reviewed Labs: ordered. Radiology: ordered.  Risk OTC drugs. Prescription drug management.   ***  {Document critical  care time when appropriate:1} {Document review of labs and clinical decision tools ie heart score, Chads2Vasc2 etc:1}  {Document your independent review of radiology images, and any outside records:1} {Document your discussion with family members, caretakers, and with consultants:1} {Document social determinants of health affecting pt's care:1} {Document your decision making why or why not admission, treatments were needed:1} Final Clinical Impression(s) / ED Diagnoses Final diagnoses:  None    Rx / DC Orders ED Discharge Orders     None

## 2022-06-04 NOTE — ED Triage Notes (Signed)
Pt here from home with c/o cough and sob , was here about 1 month ago with the same

## 2022-06-05 ENCOUNTER — Telehealth: Payer: Self-pay | Admitting: *Deleted

## 2022-06-05 NOTE — Transitions of Care (Post Inpatient/ED Visit) (Signed)
   06/05/2022  Name: Deborah Singleton MRN: [id number] DOB: 1995/11/10  Today's TOC FU Call Status: Today's TOC FU Call Status:: Unsuccessul Call (1st Attempt) Unsuccessful Call (1st Attempt) Date: 06/05/22  Attempted to reach the patient regarding the most recent Inpatient/ED visit.  Follow Up Plan: Additional outreach attempts will be made to reach the patient to complete the Transitions of Care (Post Inpatient/ED visit) call.   Estanislado Emms RN, BSN [place]  [facility] RN Care Coordinator [phone]

## 2022-07-15 ENCOUNTER — Ambulatory Visit
Admission: EM | Admit: 2022-07-15 | Discharge: 2022-07-15 | Disposition: A | Discharge status: Home / Self Care | Payer: Medicaid Other | Attending: Internal Medicine | Admitting: Internal Medicine

## 2022-07-15 DIAGNOSIS — N898 Other specified noninflammatory disorders of vagina: Secondary | ICD-10-CM | POA: Diagnosis not present

## 2022-07-15 DIAGNOSIS — R1030 Lower abdominal pain, unspecified: Secondary | ICD-10-CM | POA: Diagnosis not present

## 2022-07-15 DIAGNOSIS — Z113 Encounter for screening for infections with a predominantly sexual mode of transmission: Secondary | ICD-10-CM | POA: Insufficient documentation

## 2022-07-15 LAB — POCT URINALYSIS DIP (MANUAL ENTRY)
Bilirubin, UA: NEGATIVE
Blood, UA: NEGATIVE
Glucose, UA: NEGATIVE mg/dL
Ketones, POC UA: NEGATIVE mg/dL
Leukocytes, UA: NEGATIVE
Nitrite, UA: NEGATIVE
Protein Ur, POC: NEGATIVE mg/dL
Spec Grav, UA: 1.025 (ref 1.010–1.025)
Urobilinogen, UA: 0.2 E.U./dL
pH, UA: 7 (ref 5.0–8.0)

## 2022-07-15 LAB — POCT URINE PREGNANCY: Preg Test, Ur: NEGATIVE

## 2022-07-15 NOTE — ED Provider Notes (Signed)
[facility]    CSN: [id number] Arrival date & time: 07/15/22  0859      History   Chief Complaint Chief Complaint  Patient presents with   Abdominal Pain    Abdominal pain. X1 week    HPI Deborah Singleton is a 27 y.o. female.   Patient presents with lower abdominal pain that has been present for approximately 1 week.  Patient denies nausea, vomiting, diarrhea and is having normal bowel movements with no blood in stool.  Denies fever, body aches, chills.  Patient denies dysuria, urinary frequency, hematuria but does report some change in vaginal discharge and vaginal odor.  Patient denies exposure to STD but reports that she has had unprotected intercourse with her children's father who has been unfaithful.  Last menstrual cycle was 06/19/2022.   Abdominal Pain   Past Medical History:  Diagnosis Date   Miscarriage 06/02/2019   Seizures (HCC)    last seizure Aug 2021    Patient Active Problem List   Diagnosis Date Noted   Pelvic pain 02/23/2022   Gestational hypertension, third trimester 08/19/2020   Supervision of other normal pregnancy, antepartum 02/26/2020   Seizure disorder in pregnancy, antepartum (HCC) 02/26/2020   Obesity during pregnancy, antepartum 02/26/2020   Generalized idiopathic epilepsy and epileptic syndromes, not intractable, without status epilepticus (HCC) 04/24/2018   Maternal varicella, non-immune 04/04/2016    Past Surgical History:  Procedure Laterality Date   TOOTH EXTRACTION     TUBAL LIGATION Bilateral 08/21/2020   Procedure: POST PARTUM TUBAL LIGATION;  Surgeon: Hermina Staggers, MD;  Location: [facility] LD [facility]S;  Service: Gynecology;  Laterality: Bilateral;    OB History     Gravida  4   Para  3   Term  2   Preterm  1   AB  1   Living  3      SAB      IAB  1   Ectopic      Multiple  0   Live Births  3            Home Medications    Prior to Admission medications   Medication Sig Start Date End Date  Taking? Authorizing Provider  HYDROcodone bit-homatropine (HYCODAN) 5-1.5 MG/5ML syrup Take 5 mLs by mouth every 6 (six) hours as needed for cough. 06/04/22  Yes Antony Madura, PA-C  ibuprofen (ADVIL) 600 MG tablet Take 1 tablet (600 mg total) by mouth every 6 (six) hours as needed for mild pain or moderate pain. 04/04/22  Yes Nishant Schrecengost, Rolly Salter E, FNP  SUMAtriptan (IMITREX) 50 MG tablet Take 1 tablet at onset of migraine. May repeat in 2 hours if headache persists or recurs. Do not take more than 3 a week 05/29/22  Yes Van Clines, MD  topiramate (TOPAMAX) 50 MG tablet Take 1 and 1/2 tablets twice a day 05/29/22  Yes Van Clines, MD  fexofenadine (ALLEGRA) 60 MG tablet Take 1 tablet (60 mg total) by mouth 2 (two) times daily. 05/07/22 06/06/22  Gerhard Munch, MD    Family History Family History  Problem Relation Age of Onset   Asthma Mother    Seizures Mother    Diabetes Father    Depression Sister    Diabetes Sister        younger sister   Cancer Maternal Aunt        on dad's side   Cancer Maternal Uncle        on dad's side  Social History Social History   Tobacco Use   Smoking status: Never   Smokeless tobacco: Never  Vaping Use   Vaping Use: Never used  Substance Use Topics   Alcohol use: Not Currently   Drug use: No     Allergies   Acetaminophen   Review of Systems Review of Systems Per HPI  Physical Exam Triage Vital Signs ED Triage Vitals  Enc Vitals Group     BP 07/15/22 0932 109/73     Pulse Rate 07/15/22 0932 81     Resp 07/15/22 0932 19     Temp 07/15/22 0932 98 F (36.7 C)     Temp Source 07/15/22 0932 Oral     SpO2 07/15/22 0932 97 %     Weight 07/15/22 0931 224 lb (101.6 kg)     Height 07/15/22 0931 5\' 1"  (1.549 m)     Head Circumference --      Peak Flow --      Pain Score 07/15/22 0931 6     Pain Loc --      Pain Edu? --      Excl. in GC? --    No data found.  Updated Vital Signs BP 109/73 (BP Location: Left Arm)   Pulse 81   Temp 98  F (36.7 C) (Oral)   Resp 19   Ht 5\' 1"  (1.549 m)   Wt 224 lb (101.6 kg)   LMP 06/19/2022   SpO2 97%   BMI 42.32 kg/m   Visual Acuity Right Eye Distance:   Left Eye Distance:   Bilateral Distance:    Right Eye Near:   Left Eye Near:    Bilateral Near:     Physical Exam Constitutional:      General: She is not in acute distress.    Appearance: Normal appearance. She is not toxic-appearing or diaphoretic.  HENT:     Head: Normocephalic and atraumatic.  Eyes:     Extraocular Movements: Extraocular movements intact.     Conjunctiva/sclera: Conjunctivae normal.  Cardiovascular:     Rate and Rhythm: Normal rate and regular rhythm.     Pulses: Normal pulses.     Heart sounds: Normal heart sounds.  Pulmonary:     Effort: Pulmonary effort is normal. No respiratory distress.     Breath sounds: Normal breath sounds.  Abdominal:     General: Bowel sounds are normal. There is no distension.     Palpations: Abdomen is soft.     Tenderness: There is no abdominal tenderness.  Genitourinary:    Comments: Deferred with shared decision making. Self swab performed. Neurological:     General: No focal deficit present.     Mental Status: She is alert and oriented to person, place, and time. Mental status is at baseline.  Psychiatric:        Mood and Affect: Mood normal.        Behavior: Behavior normal.        Thought Content: Thought content normal.        Judgment: Judgment normal.      UC Treatments / Results  Labs (all labs ordered are listed, but only abnormal results are displayed) Labs Reviewed  POCT URINALYSIS DIP (MANUAL ENTRY) - Abnormal; Notable for the following components:      Result Value   Clarity, UA hazy (*)    All other components within normal limits  COMPREHENSIVE METABOLIC PANEL  CBC  POCT URINE PREGNANCY  CERVICOVAGINAL ANCILLARY ONLY  EKG   Radiology No results found.  Procedures Procedures (including critical care time)  Medications  Ordered in UC Medications - No data to display  Initial Impression / Assessment and Plan / UC Course  I have reviewed the triage vital signs and the nursing notes.  Pertinent labs & imaging results that were available during my care of the patient were reviewed by me and considered in my medical decision making (see chart for details).     Suspect possible vaginitis given change in vaginal discharge so will await cervicovaginal swab for treatment given no confirmed exposure to STD.  There are no signs of acute abdomen on exam so do not think that emergent evaluation is necessary at this time as patient is not tender to palpation.  UA was normal.  Urine pregnancy was negative.  Will obtain CMP and CBC to rule out other causes of abdominal pain.  Advised strict ER precautions and return precautions.  Patient verbalized understanding and was agreeable with plan. Final Clinical Impressions(s) / UC Diagnoses   Final diagnoses:  Lower abdominal pain  Vaginal discharge  Screening examination for venereal disease     Discharge Instructions      Urine pregnancy test and urine test were normal.  Vaginal swab is pending.  Will call if it is abnormal.  Please go to the ER if any symptoms significantly worsen.    ED Prescriptions   None    PDMP not reviewed this encounter.   Gustavus Bryant, [place] 07/15/22 1007

## 2022-07-15 NOTE — ED Triage Notes (Signed)
Pt states that she has some abdominal pain. X1 week

## 2022-07-15 NOTE — Discharge Instructions (Signed)
Urine pregnancy test and urine test were normal.  Vaginal swab is pending.  Will call if it is abnormal.  Please go to the ER if any symptoms significantly worsen.

## 2022-07-16 LAB — CBC
Hematocrit: 36.7 % (ref 34.0–46.6)
Hemoglobin: 11.8 g/dL (ref 11.1–15.9)
MCH: 27.4 pg (ref 26.6–33.0)
MCHC: 32.2 g/dL (ref 31.5–35.7)
MCV: 85 fL (ref 79–97)
Platelets: 322 10*3/uL (ref 150–450)
RBC: 4.31 x10E6/uL (ref 3.77–5.28)
RDW: 13.6 % (ref 11.7–15.4)
WBC: 6.7 10*3/uL (ref 3.4–10.8)

## 2022-07-16 LAB — COMPREHENSIVE METABOLIC PANEL
ALT: 23 IU/L (ref 0–32)
AST: 23 IU/L (ref 0–40)
Albumin: 4.2 g/dL (ref 4.0–5.0)
Alkaline Phosphatase: 81 IU/L (ref 44–121)
BUN/Creatinine Ratio: 9 (ref 9–23)
BUN: 7 mg/dL (ref 6–20)
Bilirubin Total: <0.2 mg/dL (ref 0.0–1.2)
CO2: 20 mmol/L (ref 20–29)
Calcium: 9.5 mg/dL (ref 8.7–10.2)
Chloride: 108 mmol/L — ABNORMAL HIGH (ref 96–106)
Creatinine, Ser: 0.79 mg/dL (ref 0.57–1.00)
Globulin, Total: 2.7 g/dL (ref 1.5–4.5)
Glucose: 83 mg/dL (ref 70–99)
Potassium: 4.4 mmol/L (ref 3.5–5.2)
Sodium: 139 mmol/L (ref 134–144)
Total Protein: 6.9 g/dL (ref 6.0–8.5)
eGFR: 106 mL/min/{1.73_m2} (ref >59)

## 2022-07-16 NOTE — Progress Notes (Signed)
Message to pt via MyChart.

## 2022-07-17 LAB — CERVICOVAGINAL ANCILLARY ONLY
Bacterial Vaginitis (gardnerella): POSITIVE — AB
Candida Glabrata: NEGATIVE
Candida Vaginitis: NEGATIVE
Chlamydia: NEGATIVE
Comment: NEGATIVE
Comment: NEGATIVE
Comment: NEGATIVE
Comment: NEGATIVE
Comment: NEGATIVE
Comment: NORMAL
Neisseria Gonorrhea: NEGATIVE
Trichomonas: NEGATIVE

## 2022-07-18 ENCOUNTER — Telehealth: Payer: Self-pay | Admitting: Emergency Medicine

## 2022-07-18 MED ORDER — METRONIDAZOLE 500 MG PO TABS: 500.0000 mg | ORAL_TABLET | Freq: Two times a day (BID) | ORAL | 0 refills | Status: AC

## 2022-09-12 ENCOUNTER — Ambulatory Visit: Payer: Medicaid Other | Admitting: Neurology

## 2022-09-12 ENCOUNTER — Encounter: Payer: Self-pay | Admitting: Neurology

## 2022-09-29 DIAGNOSIS — N898 Other specified noninflammatory disorders of vagina: Secondary | ICD-10-CM | POA: Diagnosis not present

## 2022-11-04 DIAGNOSIS — R1030 Lower abdominal pain, unspecified: Secondary | ICD-10-CM | POA: Diagnosis not present

## 2022-11-04 DIAGNOSIS — N390 Urinary tract infection, site not specified: Secondary | ICD-10-CM | POA: Diagnosis not present

## 2022-12-23 DIAGNOSIS — R000 Tachycardia, unspecified: Secondary | ICD-10-CM | POA: Diagnosis not present

## 2022-12-23 DIAGNOSIS — J9811 Atelectasis: Secondary | ICD-10-CM | POA: Diagnosis not present

## 2022-12-23 DIAGNOSIS — J189 Pneumonia, unspecified organism: Secondary | ICD-10-CM | POA: Diagnosis not present

## 2022-12-24 DIAGNOSIS — J9811 Atelectasis: Secondary | ICD-10-CM | POA: Diagnosis not present

## 2022-12-24 DIAGNOSIS — J189 Pneumonia, unspecified organism: Secondary | ICD-10-CM | POA: Diagnosis not present

## 2023-02-04 DIAGNOSIS — N898 Other specified noninflammatory disorders of vagina: Secondary | ICD-10-CM | POA: Diagnosis not present

## 2023-04-17 DIAGNOSIS — Z5321 Procedure and treatment not carried out due to patient leaving prior to being seen by health care provider: Secondary | ICD-10-CM | POA: Diagnosis not present

## 2023-04-17 DIAGNOSIS — J029 Acute pharyngitis, unspecified: Secondary | ICD-10-CM | POA: Diagnosis not present

## 2023-05-28 DIAGNOSIS — K2900 Acute gastritis without bleeding: Secondary | ICD-10-CM | POA: Diagnosis not present

## 2023-06-26 DIAGNOSIS — F411 Generalized anxiety disorder: Secondary | ICD-10-CM | POA: Diagnosis not present

## 2023-06-29 DIAGNOSIS — F411 Generalized anxiety disorder: Secondary | ICD-10-CM | POA: Diagnosis not present

## 2023-07-03 DIAGNOSIS — F411 Generalized anxiety disorder: Secondary | ICD-10-CM | POA: Diagnosis not present

## 2023-07-04 DIAGNOSIS — Z1330 Encounter for screening examination for mental health and behavioral disorders, unspecified: Secondary | ICD-10-CM | POA: Diagnosis not present

## 2023-07-04 DIAGNOSIS — G40309 Generalized idiopathic epilepsy and epileptic syndromes, not intractable, without status epilepticus: Secondary | ICD-10-CM | POA: Diagnosis not present

## 2023-07-04 DIAGNOSIS — G40909 Epilepsy, unspecified, not intractable, without status epilepticus: Secondary | ICD-10-CM | POA: Diagnosis not present

## 2023-07-04 DIAGNOSIS — Z7689 Persons encountering health services in other specified circumstances: Secondary | ICD-10-CM | POA: Diagnosis not present

## 2023-07-04 DIAGNOSIS — Z1331 Encounter for screening for depression: Secondary | ICD-10-CM | POA: Diagnosis not present

## 2023-07-05 DIAGNOSIS — F411 Generalized anxiety disorder: Secondary | ICD-10-CM | POA: Diagnosis not present

## 2023-07-10 DIAGNOSIS — F411 Generalized anxiety disorder: Secondary | ICD-10-CM | POA: Diagnosis not present

## 2023-07-13 DIAGNOSIS — F411 Generalized anxiety disorder: Secondary | ICD-10-CM | POA: Diagnosis not present

## 2023-07-17 DIAGNOSIS — F411 Generalized anxiety disorder: Secondary | ICD-10-CM | POA: Diagnosis not present

## 2023-07-20 DIAGNOSIS — F411 Generalized anxiety disorder: Secondary | ICD-10-CM | POA: Diagnosis not present

## 2023-07-24 DIAGNOSIS — F411 Generalized anxiety disorder: Secondary | ICD-10-CM | POA: Diagnosis not present

## 2023-07-26 DIAGNOSIS — F411 Generalized anxiety disorder: Secondary | ICD-10-CM | POA: Diagnosis not present

## 2023-07-30 DIAGNOSIS — F411 Generalized anxiety disorder: Secondary | ICD-10-CM | POA: Diagnosis not present

## 2023-08-02 DIAGNOSIS — F411 Generalized anxiety disorder: Secondary | ICD-10-CM | POA: Diagnosis not present

## 2023-08-07 DIAGNOSIS — F411 Generalized anxiety disorder: Secondary | ICD-10-CM | POA: Diagnosis not present

## 2023-08-10 DIAGNOSIS — F411 Generalized anxiety disorder: Secondary | ICD-10-CM | POA: Diagnosis not present

## 2023-08-14 DIAGNOSIS — F411 Generalized anxiety disorder: Secondary | ICD-10-CM | POA: Diagnosis not present

## 2023-08-17 DIAGNOSIS — F411 Generalized anxiety disorder: Secondary | ICD-10-CM | POA: Diagnosis not present

## 2023-08-21 DIAGNOSIS — F411 Generalized anxiety disorder: Secondary | ICD-10-CM | POA: Diagnosis not present

## 2023-08-26 DIAGNOSIS — F411 Generalized anxiety disorder: Secondary | ICD-10-CM | POA: Diagnosis not present

## 2023-08-29 DIAGNOSIS — F411 Generalized anxiety disorder: Secondary | ICD-10-CM | POA: Diagnosis not present

## 2023-09-01 DIAGNOSIS — F411 Generalized anxiety disorder: Secondary | ICD-10-CM | POA: Diagnosis not present

## 2023-09-05 DIAGNOSIS — F411 Generalized anxiety disorder: Secondary | ICD-10-CM | POA: Diagnosis not present

## 2023-09-08 DIAGNOSIS — F411 Generalized anxiety disorder: Secondary | ICD-10-CM | POA: Diagnosis not present

## 2023-09-12 DIAGNOSIS — F411 Generalized anxiety disorder: Secondary | ICD-10-CM | POA: Diagnosis not present

## 2023-09-15 DIAGNOSIS — F411 Generalized anxiety disorder: Secondary | ICD-10-CM | POA: Diagnosis not present

## 2023-09-18 DIAGNOSIS — F411 Generalized anxiety disorder: Secondary | ICD-10-CM | POA: Diagnosis not present

## 2023-09-21 DIAGNOSIS — F411 Generalized anxiety disorder: Secondary | ICD-10-CM | POA: Diagnosis not present

## 2023-09-26 DIAGNOSIS — F411 Generalized anxiety disorder: Secondary | ICD-10-CM | POA: Diagnosis not present

## 2023-09-27 DIAGNOSIS — F411 Generalized anxiety disorder: Secondary | ICD-10-CM | POA: Diagnosis not present

## 2023-10-01 DIAGNOSIS — F411 Generalized anxiety disorder: Secondary | ICD-10-CM | POA: Diagnosis not present

## 2023-10-03 DIAGNOSIS — F411 Generalized anxiety disorder: Secondary | ICD-10-CM | POA: Diagnosis not present

## 2023-10-11 DIAGNOSIS — F411 Generalized anxiety disorder: Secondary | ICD-10-CM | POA: Diagnosis not present

## 2023-10-13 DIAGNOSIS — F411 Generalized anxiety disorder: Secondary | ICD-10-CM | POA: Diagnosis not present

## 2023-10-15 DIAGNOSIS — F411 Generalized anxiety disorder: Secondary | ICD-10-CM | POA: Diagnosis not present

## 2023-10-19 DIAGNOSIS — R9431 Abnormal electrocardiogram [ECG] [EKG]: Secondary | ICD-10-CM | POA: Diagnosis not present

## 2023-10-19 DIAGNOSIS — M542 Cervicalgia: Secondary | ICD-10-CM | POA: Diagnosis not present

## 2023-10-19 DIAGNOSIS — R079 Chest pain, unspecified: Secondary | ICD-10-CM | POA: Diagnosis not present

## 2023-10-19 DIAGNOSIS — Z5321 Procedure and treatment not carried out due to patient leaving prior to being seen by health care provider: Secondary | ICD-10-CM | POA: Diagnosis not present

## 2023-10-19 DIAGNOSIS — R000 Tachycardia, unspecified: Secondary | ICD-10-CM | POA: Diagnosis not present

## 2023-10-19 DIAGNOSIS — F411 Generalized anxiety disorder: Secondary | ICD-10-CM | POA: Diagnosis not present

## 2023-10-22 DIAGNOSIS — F411 Generalized anxiety disorder: Secondary | ICD-10-CM | POA: Diagnosis not present

## 2023-10-23 DIAGNOSIS — F411 Generalized anxiety disorder: Secondary | ICD-10-CM | POA: Diagnosis not present

## 2023-10-24 DIAGNOSIS — Z041 Encounter for examination and observation following transport accident: Secondary | ICD-10-CM | POA: Diagnosis not present

## 2023-10-24 DIAGNOSIS — Z1330 Encounter for screening examination for mental health and behavioral disorders, unspecified: Secondary | ICD-10-CM | POA: Diagnosis not present

## 2023-10-24 DIAGNOSIS — Z113 Encounter for screening for infections with a predominantly sexual mode of transmission: Secondary | ICD-10-CM | POA: Diagnosis not present

## 2023-10-24 DIAGNOSIS — R0789 Other chest pain: Secondary | ICD-10-CM | POA: Diagnosis not present

## 2023-10-24 DIAGNOSIS — Z0000 Encounter for general adult medical examination without abnormal findings: Secondary | ICD-10-CM | POA: Diagnosis not present

## 2023-10-24 DIAGNOSIS — Z6841 Body Mass Index (BMI) 40.0 and over, adult: Secondary | ICD-10-CM | POA: Diagnosis not present

## 2023-10-24 DIAGNOSIS — Z1331 Encounter for screening for depression: Secondary | ICD-10-CM | POA: Diagnosis not present

## 2023-10-24 DIAGNOSIS — Z131 Encounter for screening for diabetes mellitus: Secondary | ICD-10-CM | POA: Diagnosis not present

## 2023-11-20 DIAGNOSIS — F411 Generalized anxiety disorder: Secondary | ICD-10-CM | POA: Diagnosis not present

## 2023-11-24 DIAGNOSIS — F411 Generalized anxiety disorder: Secondary | ICD-10-CM | POA: Diagnosis not present

## 2023-11-26 DIAGNOSIS — M25512 Pain in left shoulder: Secondary | ICD-10-CM | POA: Diagnosis not present

## 2023-11-26 DIAGNOSIS — S29012A Strain of muscle and tendon of back wall of thorax, initial encounter: Secondary | ICD-10-CM | POA: Diagnosis not present

## 2023-11-26 DIAGNOSIS — Z23 Encounter for immunization: Secondary | ICD-10-CM | POA: Diagnosis not present

## 2023-11-26 DIAGNOSIS — M546 Pain in thoracic spine: Secondary | ICD-10-CM | POA: Diagnosis not present

## 2023-11-26 DIAGNOSIS — Z124 Encounter for screening for malignant neoplasm of cervix: Secondary | ICD-10-CM | POA: Diagnosis not present

## 2023-11-26 DIAGNOSIS — Z113 Encounter for screening for infections with a predominantly sexual mode of transmission: Secondary | ICD-10-CM | POA: Diagnosis not present

## 2023-11-27 DIAGNOSIS — F411 Generalized anxiety disorder: Secondary | ICD-10-CM | POA: Diagnosis not present

## 2023-12-01 DIAGNOSIS — F411 Generalized anxiety disorder: Secondary | ICD-10-CM | POA: Diagnosis not present

## 2023-12-04 DIAGNOSIS — F411 Generalized anxiety disorder: Secondary | ICD-10-CM | POA: Diagnosis not present

## 2023-12-06 DIAGNOSIS — F411 Generalized anxiety disorder: Secondary | ICD-10-CM | POA: Diagnosis not present

## 2023-12-12 DIAGNOSIS — F411 Generalized anxiety disorder: Secondary | ICD-10-CM | POA: Diagnosis not present

## 2023-12-13 DIAGNOSIS — F411 Generalized anxiety disorder: Secondary | ICD-10-CM | POA: Diagnosis not present

## 2023-12-18 DIAGNOSIS — F411 Generalized anxiety disorder: Secondary | ICD-10-CM | POA: Diagnosis not present

## 2023-12-21 DIAGNOSIS — F411 Generalized anxiety disorder: Secondary | ICD-10-CM | POA: Diagnosis not present

## 2023-12-25 DIAGNOSIS — F411 Generalized anxiety disorder: Secondary | ICD-10-CM | POA: Diagnosis not present

## 2023-12-30 DIAGNOSIS — F411 Generalized anxiety disorder: Secondary | ICD-10-CM | POA: Diagnosis not present

## 2024-01-01 DIAGNOSIS — F411 Generalized anxiety disorder: Secondary | ICD-10-CM | POA: Diagnosis not present

## 2024-01-03 DIAGNOSIS — F411 Generalized anxiety disorder: Secondary | ICD-10-CM | POA: Diagnosis not present

## 2024-01-08 DIAGNOSIS — F411 Generalized anxiety disorder: Secondary | ICD-10-CM | POA: Diagnosis not present

## 2024-01-11 DIAGNOSIS — F411 Generalized anxiety disorder: Secondary | ICD-10-CM | POA: Diagnosis not present

## 2024-01-16 DIAGNOSIS — F411 Generalized anxiety disorder: Secondary | ICD-10-CM | POA: Diagnosis not present

## 2024-01-18 DIAGNOSIS — F411 Generalized anxiety disorder: Secondary | ICD-10-CM | POA: Diagnosis not present

## 2024-01-21 DIAGNOSIS — F411 Generalized anxiety disorder: Secondary | ICD-10-CM | POA: Diagnosis not present

## 2024-01-22 DIAGNOSIS — F411 Generalized anxiety disorder: Secondary | ICD-10-CM | POA: Diagnosis not present
